# Patient Record
Sex: Male | Born: 1945
Health system: Southern US, Community
[De-identification: ages and names within clinical notes are randomized; demographics above are authoritative.]

## PROBLEM LIST (undated history)

## (undated) DIAGNOSIS — G709 Myoneural disorder, unspecified: Secondary | ICD-10-CM

## (undated) DIAGNOSIS — K5900 Constipation, unspecified: Secondary | ICD-10-CM

## (undated) DIAGNOSIS — R030 Elevated blood-pressure reading, without diagnosis of hypertension: Secondary | ICD-10-CM

## (undated) DIAGNOSIS — M1990 Unspecified osteoarthritis, unspecified site: Secondary | ICD-10-CM

## (undated) DIAGNOSIS — Z8679 Personal history of other diseases of the circulatory system: Secondary | ICD-10-CM

## (undated) DIAGNOSIS — Z972 Presence of dental prosthetic device (complete) (partial): Secondary | ICD-10-CM

## (undated) DIAGNOSIS — G4733 Obstructive sleep apnea (adult) (pediatric): Secondary | ICD-10-CM

## (undated) DIAGNOSIS — M19011 Primary osteoarthritis, right shoulder: Secondary | ICD-10-CM

## (undated) DIAGNOSIS — G4730 Sleep apnea, unspecified: Secondary | ICD-10-CM

## (undated) HISTORY — PX: LASIK: SHX215

## (undated) HISTORY — DX: Myoneural disorder, unspecified: G70.9

## (undated) HISTORY — PX: TONSILLECTOMY AND ADENOIDECTOMY: SUR1326

## (undated) HISTORY — PX: CARPAL TUNNEL RELEASE: SHX101

## (undated) HISTORY — DX: Constipation, unspecified: K59.00

## (undated) HISTORY — PX: COLONOSCOPY: SHX174

## (undated) HISTORY — PX: JOINT REPLACEMENT: SHX530

## (undated) HISTORY — PX: POLYPECTOMY: SHX149

## (undated) HISTORY — PX: TRIGGER FINGER RELEASE: SHX641

## (undated) HISTORY — PX: KNEE CARTILAGE SURGERY: SHX688

## (undated) HISTORY — DX: Elevated blood-pressure reading, without diagnosis of hypertension: R03.0

---

## 2000-05-13 ENCOUNTER — Ambulatory Visit (HOSPITAL_COMMUNITY): Admission: RE | Admit: 2000-05-13 | Discharge: 2000-05-13 | Payer: Self-pay | Admitting: Gastroenterology

## 2003-10-24 ENCOUNTER — Inpatient Hospital Stay (HOSPITAL_COMMUNITY): Admission: RE | Admit: 2003-10-24 | Discharge: 2003-10-26 | Payer: Self-pay | Admitting: Orthopedic Surgery

## 2003-11-15 ENCOUNTER — Ambulatory Visit (HOSPITAL_COMMUNITY): Admission: RE | Admit: 2003-11-15 | Discharge: 2003-11-15 | Payer: Self-pay | Admitting: Orthopedic Surgery

## 2005-09-04 ENCOUNTER — Ambulatory Visit: Payer: Self-pay | Admitting: Family Medicine

## 2005-12-10 ENCOUNTER — Ambulatory Visit: Payer: Self-pay | Admitting: Family Medicine

## 2007-12-10 ENCOUNTER — Ambulatory Visit: Payer: Self-pay | Admitting: Family Medicine

## 2007-12-10 ENCOUNTER — Telehealth: Payer: Self-pay | Admitting: Family Medicine

## 2007-12-10 DIAGNOSIS — G5600 Carpal tunnel syndrome, unspecified upper limb: Secondary | ICD-10-CM

## 2007-12-10 DIAGNOSIS — M6530 Trigger finger, unspecified finger: Secondary | ICD-10-CM | POA: Insufficient documentation

## 2007-12-10 DIAGNOSIS — M2550 Pain in unspecified joint: Secondary | ICD-10-CM | POA: Insufficient documentation

## 2007-12-10 DIAGNOSIS — G5620 Lesion of ulnar nerve, unspecified upper limb: Secondary | ICD-10-CM | POA: Insufficient documentation

## 2007-12-11 LAB — CONVERTED CEMR LAB: Uric Acid, Serum: 6.4 mg/dL (ref 4.0–7.8)

## 2007-12-30 ENCOUNTER — Ambulatory Visit (HOSPITAL_BASED_OUTPATIENT_CLINIC_OR_DEPARTMENT_OTHER): Admission: RE | Admit: 2007-12-30 | Discharge: 2007-12-30 | Payer: Self-pay | Admitting: Orthopedic Surgery

## 2007-12-30 ENCOUNTER — Encounter (INDEPENDENT_AMBULATORY_CARE_PROVIDER_SITE_OTHER): Payer: Self-pay | Admitting: Orthopedic Surgery

## 2008-02-03 ENCOUNTER — Ambulatory Visit (HOSPITAL_BASED_OUTPATIENT_CLINIC_OR_DEPARTMENT_OTHER): Admission: RE | Admit: 2008-02-03 | Discharge: 2008-02-03 | Payer: Self-pay | Admitting: Orthopedic Surgery

## 2010-01-25 ENCOUNTER — Encounter: Admission: RE | Admit: 2010-01-25 | Discharge: 2010-01-25 | Payer: Self-pay | Admitting: Orthopedic Surgery

## 2010-03-17 ENCOUNTER — Encounter: Payer: Self-pay | Admitting: Orthopedic Surgery

## 2010-07-10 NOTE — Op Note (Signed)
NAME:  Kyle [name], Kyle [name] NO.:  [id number]   MEDICAL RECORD NO.:  [id number]          PATIENT TYPE:  AMB   LOCATION:  DSC                          FACILITY:  [facility]   PHYSICIAN:  Artist Pais. Weingold, M.D.DATE OF BIRTH:  1945/07/03   DATE OF PROCEDURE:  12/30/2007  DATE OF DISCHARGE:                               OPERATIVE REPORT   PREOPERATIVE DIAGNOSES:  1. Left Dupuytren's contracture involving ring finger.  2. Left carpal tunnel syndrome.  3. Left index finger stenosing tenosynovitis.   POSTOPERATIVE DIAGNOSES:  1. Left Dupuytren's contracture involving ring finger.  2. Left carpal tunnel syndrome.  3. Left index finger stenosing tenosynovitis.   PROCEDURES:  1. Excision of Dupuytren's contracture, left hand, left ring finger.  2. Left carpal tunnel release.  3. Left index finger A1 pulley release.   SURGEON:  Artist Pais. Mina Marble, MD   ASSISTANT:  None.   ANESTHESIA:  General.   TOURNIQUET TIME:  45 minutes.   COMPLICATIONS:  None.   DRAINS:  None.   OPERATIVE REPORT:  The patient was taken to the operating suite.  After  induction of adequate general anesthesia, the left upper extremity was  prepped and draped in the usual sterile fashion.  Esmarch was  exsanguinated.  Limb tourniquet was inflated to 250 mmHg.  At this point  in time, a 2-cm incision was made in the palmar aspect of the left hand  in line along the metacarpals starting at the [facility] cardinal line.  Skin was incised.  Palmar fascia was identified and split.  The distal  edge of the transverse carpal ligament was identified and split with the  15 blade.  The median nerve was identified and protected with a Market researcher.  The remaining aspects of the transverse carpal ligament was  divided under direct vision using curved blunt scissors.  The canal was  inspected.  There were osseous lesions or ganglions present.  It was  irrigated and loosely closed with a 3-0 Prolene subcuticular  stitch.  Second incision was made over the ring finger in a Brunner fashion going  from the mid palmer to the level of the metacarpophalangeal joint.  Flaps were raised accordingly.  Dissection was carried down to the  flexor sheath where a large Dupuytren's cord was encountered.  It was  carefully dissected free off the flexor sheath from the level of the mid  palm to the metacarpophalangeal joint.  Neurovascular bundle was  identified and retracted.  The Dupuytren's contracture was sent for  pathologic confirmation.  Third incision was made transversely at the A1  pulley, index finger.  Skin was incised.  Neurovascular bundle was  identified and retracted.  The A1 pulley was split.  The FDS and FDP  tendons were lysed of all adhesions.  The wound was irrigated and these  too were closed with 3-0 Prolene subcuticular stitches.  Steri-Strips, 4  x 4s, fluffs, and a volar splint was applied.  The patient tolerated all  3 procedures well and went to the recovery room in stable fashion.      Molli Hazard  Corky Downs, M.D.  Electronically Signed     MAW/MEDQ  D:  12/30/2007  T:  12/30/2007  Job:  [id number]

## 2010-07-10 NOTE — Op Note (Signed)
NAME:  Kyle [name], Kyle [name] NO.:  [id number]   MEDICAL RECORD NO.:  [id number]          PATIENT TYPE:  AMB   LOCATION:  DSC                          FACILITY:  [facility]   PHYSICIAN:  Artist Pais. Weingold, M.D.DATE OF BIRTH:  Jul 11, 1945   DATE OF PROCEDURE:  02/03/2008  DATE OF DISCHARGE:                               OPERATIVE REPORT   PREOPERATIVE DIAGNOSES:  Right carpal tunnel syndrome, right index and  right long A1 pulley stenosing tenosynovitis.   POSTOPERATIVE DIAGNOSES:  Right carpal tunnel syndrome, right index and  right long A1 pulley stenosing tenosynovitis.   PROCEDURE:  Right carpal tunnel release and right index and right long  A1 pulley releases.   SURGEON:  Artist Pais. Mina Marble, MD   ASSISTANT:  None.   ANESTHESIA:  General.   TOURNIQUET TIME:  20 minutes.   COMPLICATIONS:  None.   DRAINS:  None.   OPERATIVE REPORT:  The patient was taken to the operating suite.  After  induction of adequate general anesthesia, right upper extremity was  prepped and draped in the usual sterile fashion.  An Esmarch was used to  exsanguinate the limb.  Tourniquet was then inflated to 250 mmHg.  At  this point in time, incision was made over the palmar aspect of the  right hand, in line with the carpometacarpal starting at [facility] cardinal  line.  Skin was incised.  Palmar fascia was identified and the distal  edge of the transverse carpal ligament was identified with a 15 blade.  The median nerve was identified and protected with Therapist, nutritional.  Remaining aspects of the transverse carpal ligament then divided under  direct vision using curved and blunt scissors.  Canal was inspected.  There were no osseous lesions or ganglions present.  It was irrigated  and nicely closed with a 3-0 Prolene subcuticular stitch.  Second  incision was made of the index finger A1 pulley and the third incision  of the long finger A1 pulley transversely in flexion creases.  Skin was  incised.  Neurovascular and bones identified and retracted, the A1  pulleys  were split.  The FDS and FDP tendons were lysed off all adhesions.  Wound was irrigated and these 2 were also closed with 3-0 Prolene  subcuticular stitches.  Steri-Strips, 4 x 4s, fluffs, and compressive  dressing was applied.  The patient tolerated all 3 procedures well and  went to recovery room in stable fashion.      Artist Pais Mina Marble, M.D.  Electronically Signed     MAW/MEDQ  D:  02/03/2008  T:  02/03/2008  Job:  [id number]

## 2010-07-13 NOTE — Procedures (Signed)
[facility]  Patient:    Kyle [name], Kyle [name]                       MRN: [id number] Proc. Date: 05/13/00 Adm. Date:  [id number] Attending:  Louie Bun                           Procedure Report  PROCEDURE:  Colonoscopy.  INDICATION FOR PROCEDURE:  History of adenomatous colon polyps on index colonoscopy three years ago.  DESCRIPTION OF PROCEDURE:  The patient was placed in the left lateral decubitus position then placed on the pulse monitor with continuous low flow oxygen delivered by nasal cannula. He was sedated with 90 mg IV Demerol and  9 mg IV Versed. The Olympus video colonoscope was inserted into the rectum and advanced to the cecum, confirmed by transillumination at McBurneys point and visualization of the ileocecal valve and appendiceal orifice. The prep was good. The cecum, ascending, transverse, and descending colon all appeared normal with no masses, polyps, diverticula or other mucosal abnormalities. Within the sigmoid colon were seen a few scattered diverticula, no other abnormalities. The rectum appeared normal and retroflexed view of the anus revealed no obvious internal hemorrhoids. The colonoscope was then withdrawn and the patient returned to the recovery room in stable condition. The patient tolerated the procedure well and there were no immediate complications.  IMPRESSION:  Left sided diverticulosis otherwise a normal study.  PLAN:  Repeat colonoscopy in 5 years. DD:  05/13/00 TD:  05/14/00 Job: [id number]

## 2010-07-13 NOTE — H&P (Signed)
NAME:  Kyle [name], CURLEY NO.:  [id number]   MEDICAL RECORD NO.:  [id number]                   PATIENT TYPE:  INP   LOCATION:  NA                                   FACILITY:  [facility]   PHYSICIAN:  Mila Homer. Sherlean Foot, M.D.              DATE OF BIRTH:  [birth year]   DATE OF ADMISSION:  10/24/2003  DATE OF DISCHARGE:                                HISTORY & PHYSICAL   CHIEF COMPLAINT:  Painful left knee.   HISTORY OF PRESENT ILLNESS:  The patient is a 65 year old white male with  history of cartilage injury with open surgery as a child, with repeat  arthroscopy in the 1990s with some chronic knee problems.  He has been  having increased achiness and sharp stabbing-type pains in his knee over the  last several years.  It is progressively worsening.  It bothers him with  range of motion and ambulation.  He is walking with a limp.  It is both  along the medial and lateral joint.  He does have occasional radiation of  the discomfort down the tibia.  He does have some stiffness with initiation  of ambulation.  X-rays revealed end-stage osteoarthritis, bone on bone,  medial compartment.   ALLERGIES:  CODEINE.   CURRENT MEDICATIONS:  1. Multivitamin one tablet daily.  2. Vitamin E one tablet p.o. daily.  3. Aspirin 81 mg p.o. daily.   PAST MEDICAL HISTORY:  The patient denies any significant medical issues.   PAST SURGICAL HISTORY:  1. Tonsillectomy and adenoidectomy in 1952.  2. Left knee surgery in 1966.  3. Left knee arthroscopy in 1992.  4. Right knee arthroscopy in 1990.  5. Laser surgery on his eyes in 2001.   The patient denies any complications to the above-mentioned surgical  procedures.   SOCIAL HISTORY:  The patient is a healthy-appearing, well-developed 58-year-  old white male.  No history of smoking or alcohol use.  He is currently  married.  He has several grown children.  He lives in a two-story house.  He  is currently working as a Estate agent.   Family physician is Marne A. Milinda Antis, M.D., at [phone].   FAMILY MEDICAL HISTORY:  Mother is deceased from an auto accident.  Father  is deceased from a stroke.  The patient has two brothers alive in good  health, one sister alive in good health.   REVIEW OF SYSTEMS:  Positive for reading glasses.  He does have occasional  problems with reflux.  Otherwise all other review of systems categories are  negative or noncontributory.   PHYSICAL EXAMINATION:  VITAL SIGNS:  Height is 5 feet 10 inches, weight is  215 pounds.  Blood pressure is 138/90, pulse of 88 and regular, respirations  12.  The patient is afebrile.  GENERAL:  This is a healthy-appearing, well-developed, physically fit-  appearing white male.  He walks  with a slight right-sided limp, but he is  able to get himself easily on and off the exam table without any difficulty  or obvious distress.  HEENT:  Head was normocephalic.  Pupils equal, round, and reactive,  accommodating to light.  Extraocular movements intact.  Sclerae are not  icteric.  External ears without deformity.  Gross hearing is intact.  Nasal  septum was midline.  Oral buccal mucosa was pink and moist.  NECK:  Supple, no palpable lymphadenopathy.  Thyroid region was nontender.  He had full range of motion without any difficulty or tenderness.  CHEST:  Lung sounds were clear and equal bilaterally.  No wheezes, rales,  rhonchi, or rubs noted.  CARDIAC:  Regular rate and rhythm, S1 and S2.  No murmurs, rubs, or gallops.  ABDOMEN:  Soft, nontender.  Bowel sounds were normoactive throughout.  No  hepatosplenomegaly palpable.  EXTREMITIES:  Upper extremities were symmetrically sized and shaped, and he  had fairly good range of motion of his shoulders, elbows, and wrists without  any difficulty.  Motor strength is 5/5.  Lower extremities:  Right and left  hip had full extension, flexion up to 130 with 20 degrees internal-external  rotation without  any difficulty.  The right knee had full extension, flexion  back to 130 degrees.  No medial to lateral instability.  No joint line  tenderness, no effusion.  The calf was nontender.  The right knee was round,  boggy appearing.  He had about 10 degrees short of full extension, flexion  back to 115 degrees.  He had about a 5 degree opening up with valgus  stressing, moderate amount of crepitus on the patella with range of motion.  No effusion, no signs of erythema.  Calf was nontender.  Ankles were  symmetrical with good dorsi-plantar flexion.  PERIPHERAL VASCULATURE:  Carotid pulses were 2+, no bruits.  Radial pulses  2+, posterior tibial pulses were 2+.  No lower extremity edema or venous  stasis changes.  NEUROLOGIC:  The patient was conscious, alert, and appropriate.  Easy  conversation with examiner.  The patient had no gross neurologic defects  noted.  BREASTS, RECTAL, GENITOURINARY:  Exams deferred at this time.   IMPRESSION:  End-stage osteoarthritis, left knee.   PLAN:  The patient will undergo all routine labs and tests prior to having a  left total knee arthroplasty by  Dr. Sherlean Foot at [facility] on October 23, 2003.      Jamelle Rushing, P.A.                      Mila Homer. Sherlean Foot, M.D.    RWK/MEDQ  D:  10/18/2003  T:  10/18/2003  Job:  [id number]

## 2010-07-13 NOTE — Op Note (Signed)
NAME:  KHYAN, OATS NO.:  [id number]   MEDICAL RECORD NO.:  [id number]                   PATIENT TYPE:  INP   LOCATION:  5010                                 FACILITY:  [facility]   PHYSICIAN:  Mila Homer. Sherlean Foot, M.D.              DATE OF BIRTH:  05-26-1945   DATE OF PROCEDURE:  10/24/2003  DATE OF DISCHARGE:                                 OPERATIVE REPORT   SURGEON:  Mila Homer. Sherlean Foot, M.D.   ASSISTANT:  Richardean Canal, P.A.   ANESTHESIA:  General.   PREOPERATIVE DIAGNOSIS:  Left knee osteoarthritis.   POSTOPERATIVE DIAGNOSIS:  Left knee osteoarthritis.   PROCEDURE:  Left total knee arthroplasty.   INDICATIONS FOR PROCEDURE:  The patient is a 65 year old white male with  failure of conservative measures for osteoarthritis of the knee.  Informed  consent was obtained.   DESCRIPTION OF PROCEDURE:  The patient was laid supine and administered  general anesthesia.  The left lower extremity was prepped and draped in the  usual sterile fashion and the extremity was exsanguinated with Esmarch.  A  midline incision was made with a #10 blade approximately 12 cm in length.  A  new 10 blade was used to make a median parapatellar arthrotomy and perform  synovectomy.  The deep MCL was peeled off the medial crest of the tibial  around to the semimembranous tendon.  The patella was everted and measured  26 mm.  A 35 mm reamer was reamed down to 26 mm.  Removed the excess bone  and soft tissue and drilled three lug holes with the template and with the  prosthetic trial in place, it also measured 26 mm.  I removed the prosthetic  trial and placed a Z-retractor deep to the MCL and went into flexion.  I cut  the ACL and PCL so I could deliver the tibia anteriorly.  I then used the  extramedullary alignment system to make a perpendicular cut to the anatomic  axis of the tibia removing 2 mm of bone off the medial tibial plateau.  I  removed the cut surface of the  bone.  I then removed the extramedullary  guide and turned my attention to the femur.  I made an intramedullary drill  hole and followed that with the intramedullary guide some 6 degree valgus  cut.  I pinned the distal femoral with the cutting block in place.  I  removed the intramedullary guide and made the distal femoral cut with the  sagittal saw.  I then drew out the epicondylar axis, measured the posterior  condylar angle to be 3 degrees.  I sized to a size G and pinned through the  3 degree external rotation holes.  I then pinned my 4-1 cone block in place  and made the anterior posterior and chamfer cuts while protecting the  ligaments with Z-retractors.  I then removed the excess  pieces of the bone  and the cutting block.  I then placed a laminar spreader in the knee,  removed the posterior condylar osteophytes, ACL, PCL, and medial and lateral  menisci.  I then put a 10 mm spacer block in place.  I had excellent  alignment.  Good flexion extension gap balancing.  I then finished the femur  with a size G finishing block.  I finished the tibia with a size 7 tibial  tray drilling keel.  I then trialed with a size 7 tibia and size G femur,  size 10 polyinsert, and size 35 patella.  I had excellent patellofemoral  tracking, good range of motion, dropping the angle back to 130 degrees.  I  then removed all of the trial prostheses and copiously irrigated.  I  cemented in the 7 tibia first, size G femur second, snapped in the  polyethylene, and located the knee extension.  I then cemented in the  patella.  All excess cement was removed.  After the cement was hardened, I  let the tourniquet down.  I left a Hemovac deep to the arthrotomy coming out  superolaterally.  I then closed the arthrotomy with interrupted #1 figure-of-  eight Vicryl sutures.  Deep  soft tissues with interrupted 0 Vicryl sutures, subcuticular 2-0 Vicryl, and  skin staples.  A dressing of Xeroform dressing, sponges,  sterile Webril, and  TED stockings.  Complications were none.  Drains was one Hemovac.  Estimated  blood loss was 300 mL.  Tourniquet time was 1 hour 8 minutes.                                               Mila Homer. Sherlean Foot, M.D.    SDL/MEDQ  D:  10/24/2003  T:  10/24/2003  Job:  [id number]

## 2010-09-14 ENCOUNTER — Telehealth: Payer: Self-pay | Admitting: *Deleted

## 2010-09-14 NOTE — Telephone Encounter (Signed)
Patient is asking if he can come in for a tdap. He says that he is about to have a new grand baby and wants to have this before it is born.

## 2010-09-14 NOTE — Telephone Encounter (Signed)
Yes, no TDAP on record... As long as last tetanus over 10 years ago he can come in for Tdap.

## 2010-09-17 NOTE — Telephone Encounter (Signed)
Appt made for patient

## 2010-09-19 ENCOUNTER — Ambulatory Visit (INDEPENDENT_AMBULATORY_CARE_PROVIDER_SITE_OTHER): Payer: Managed Care, Other (non HMO) | Admitting: Family Medicine

## 2010-09-19 DIAGNOSIS — Z23 Encounter for immunization: Secondary | ICD-10-CM | POA: Insufficient documentation

## 2010-09-19 NOTE — Progress Notes (Signed)
Tdap vaccine given today during nurse visit.  No charge.

## 2010-10-10 ENCOUNTER — Encounter: Payer: Self-pay | Admitting: Family Medicine

## 2010-10-11 ENCOUNTER — Ambulatory Visit (INDEPENDENT_AMBULATORY_CARE_PROVIDER_SITE_OTHER): Payer: Managed Care, Other (non HMO) | Admitting: Family Medicine

## 2010-10-11 ENCOUNTER — Encounter: Payer: Self-pay | Admitting: Family Medicine

## 2010-10-11 VITALS — BP 118/76 | HR 72 | Temp 98.4°F | Wt 206.2 lb

## 2010-10-11 DIAGNOSIS — K5792 Diverticulitis of intestine, part unspecified, without perforation or abscess without bleeding: Secondary | ICD-10-CM

## 2010-10-11 DIAGNOSIS — K5732 Diverticulitis of large intestine without perforation or abscess without bleeding: Secondary | ICD-10-CM

## 2010-10-11 HISTORY — DX: Diverticulitis of intestine, part unspecified, without perforation or abscess without bleeding: K57.92

## 2010-10-11 MED ORDER — CIPROFLOXACIN HCL 500 MG PO TABS: 500.0000 mg | ORAL_TABLET | Freq: Two times a day (BID) | ORAL | Status: AC

## 2010-10-11 NOTE — Patient Instructions (Signed)
Pls schedule Dr Patsy Lager with him for PE labs prior.

## 2010-10-11 NOTE — Assessment & Plan Note (Signed)
Has had colonoscopies by Dr  Bing regularly and been treated in the past for this. Will use Cipro 500 bid for ten days. HE has never needed Flagyl and appears not to need it today.  Knows to RTC if sxs worsen.

## 2010-10-11 NOTE — Progress Notes (Signed)
  Subjective:    Patient ID: Kyle [name], male    DOB: 10-20-1945, 65 y.o.   MRN: [id number]  HPI Pt seen only once here at [facility] 10/09 by his doc, Dr Patsy Lager, for arthritis. He has seen Dr Milinda Antis in the past, obviously before the computer was started here. He also sees Dr [place] Bing regularly for GI screening and is due a colonoscopy soon. He just got a letter to return.   He is here today for diverticulitis, having been treated for this in the past by Dr Viviann Spare and Dr [place] Bing. He has had pain in the LLQ for 5-6 days, better today since not eating since yesterday. He had some peanuts last week.  He has not had fever but has had chills, BMs have been nml, urination is ok. He has not taken any medications and otherwise feels well. He is generally very healthy.     Review of SystemsNoncontributory except as above.       Objective:   Physical Exam  Constitutional: He appears well-developed and well-nourished. No distress.  HENT:  Head: Normocephalic and atraumatic.  Right Ear: External ear normal.  Left Ear: External ear normal.  Nose: Nose normal.  Mouth/Throat: Oropharynx is clear and moist.  Eyes: Conjunctivae and EOM are normal. Pupils are equal, round, and reactive to light. Right eye exhibits no discharge. Left eye exhibits no discharge.  Neck: Normal range of motion. Neck supple.  Cardiovascular: Normal rate and regular rhythm.   Pulmonary/Chest: Effort normal and breath sounds normal. He has no wheezes.  Abdominal: Soft. Bowel sounds are normal. He exhibits no distension and no mass. There is tenderness (LLQ>RLQ, no referred.). There is no rebound and no guarding.  Lymphadenopathy:    He has no cervical adenopathy.  Skin: He is not diaphoretic.          Assessment & Plan:

## 2010-11-23 ENCOUNTER — Encounter: Payer: Self-pay | Admitting: Family Medicine

## 2010-11-27 LAB — POCT HEMOGLOBIN-HEMACUE: Hemoglobin: 14.1

## 2010-11-30 LAB — POCT HEMOGLOBIN-HEMACUE: Hemoglobin: 15 g/dL (ref 13.0–17.0)

## 2010-12-07 ENCOUNTER — Other Ambulatory Visit: Payer: Self-pay | Admitting: Family Medicine

## 2010-12-07 DIAGNOSIS — Z125 Encounter for screening for malignant neoplasm of prostate: Secondary | ICD-10-CM

## 2010-12-07 DIAGNOSIS — R5383 Other fatigue: Secondary | ICD-10-CM

## 2010-12-07 DIAGNOSIS — Z13220 Encounter for screening for lipoid disorders: Secondary | ICD-10-CM

## 2010-12-10 ENCOUNTER — Other Ambulatory Visit (INDEPENDENT_AMBULATORY_CARE_PROVIDER_SITE_OTHER): Payer: Managed Care, Other (non HMO)

## 2010-12-10 DIAGNOSIS — Z125 Encounter for screening for malignant neoplasm of prostate: Secondary | ICD-10-CM

## 2010-12-10 DIAGNOSIS — Z13220 Encounter for screening for lipoid disorders: Secondary | ICD-10-CM

## 2010-12-10 DIAGNOSIS — R5381 Other malaise: Secondary | ICD-10-CM

## 2010-12-10 LAB — CBC WITH DIFFERENTIAL/PLATELET
Basophils Absolute: 0 10*3/uL (ref 0.0–0.1)
HCT: 42.4 % (ref 39.0–52.0)
Lymphs Abs: 2.9 10*3/uL (ref 0.7–4.0)
MCV: 94.5 fl (ref 78.0–100.0)
Monocytes Absolute: 0.8 10*3/uL (ref 0.1–1.0)
Platelets: 276 10*3/uL (ref 150.0–400.0)
RDW: 12.8 % (ref 11.5–14.6)

## 2010-12-10 LAB — COMPREHENSIVE METABOLIC PANEL
ALT: 23 U/L (ref 0–53)
Alkaline Phosphatase: 70 U/L (ref 39–117)
CO2: 26 mEq/L (ref 19–32)
GFR: 106.47 mL/min (ref >60.00)
Potassium: 4.3 mEq/L (ref 3.5–5.1)
Sodium: 140 mEq/L (ref 135–145)
Total Bilirubin: 0.7 mg/dL (ref 0.3–1.2)
Total Protein: 7.7 g/dL (ref 6.0–8.3)

## 2010-12-10 LAB — LIPID PANEL
HDL: 63.7 mg/dL (ref >39.00)
Total CHOL/HDL Ratio: 3

## 2010-12-10 LAB — PSA: PSA: 0.64 ng/mL (ref 0.10–4.00)

## 2010-12-12 ENCOUNTER — Other Ambulatory Visit: Payer: Managed Care, Other (non HMO)

## 2010-12-17 ENCOUNTER — Ambulatory Visit (INDEPENDENT_AMBULATORY_CARE_PROVIDER_SITE_OTHER): Payer: Managed Care, Other (non HMO) | Admitting: Family Medicine

## 2010-12-17 ENCOUNTER — Encounter: Payer: Self-pay | Admitting: Family Medicine

## 2010-12-17 VITALS — BP 120/70 | HR 88 | Temp 98.2°F | Ht 69.5 in | Wt 209.8 lb

## 2010-12-17 DIAGNOSIS — Z23 Encounter for immunization: Secondary | ICD-10-CM

## 2010-12-17 DIAGNOSIS — Z0000 Encounter for general adult medical examination without abnormal findings: Secondary | ICD-10-CM

## 2010-12-17 NOTE — Patient Instructions (Signed)
zostavax (SHINGLES VACCINE) -- go to one of the pharmacies that has a sign that says "Shingles Vaccines" here. You can go there and they will check with your insurance and they can give it to you there.   If you live in [place], [facility] can do this.

## 2010-12-17 NOTE — Progress Notes (Signed)
Subjective:    Patient ID: Kyle [name], male    DOB: 28-Nov-1945, 65 y.o.   MRN: [id number]  HPI  Kyle [name], a 65 y.o. male presents today in the office for the following:    Still having some joint pains. Hands hurt, hands, hurt, back hurts.   Preventative Health Maintenance Visit:  Health Maintenance Summary Reviewed and updated, unless pt declines services.  Tobacco History Reviewed. Alcohol: No concerns, no excessive use Exercise Habits: Some activity, rec at least 30 mins 5 times a week STD concerns: no risk or activity to increase risk Drug Use: None Encouraged self-testicular check  Health Maintenance  Topic Date Due  . Zostavax  11/02/2006  . Influenza Vaccine  11/26/2010  . Colonoscopy  11/23/2015  . Tetanus/tdap  09/18/2020    Labs reviewed with the patient.   Lipids:    Component Value Date/Time   CHOL 170 12/10/2010 0817   TRIG 43.0 12/10/2010 0817   HDL 63.70 12/10/2010 0817   VLDL 8.6 12/10/2010 0817   CHOLHDL 3 12/10/2010 0817    CBC:    Component Value Date/Time   WBC 9.6 12/10/2010 0817   HGB 14.3 12/10/2010 0817   HCT 42.4 12/10/2010 0817   PLT 276.0 12/10/2010 0817   MCV 94.5 12/10/2010 0817   NEUTROABS 5.2 12/10/2010 0817   LYMPHSABS 2.9 12/10/2010 0817   MONOABS 0.8 12/10/2010 0817   EOSABS 0.6 12/10/2010 0817   BASOSABS 0.0 12/10/2010 0817    Basic Metabolic Panel:    Component Value Date/Time   NA 140 12/10/2010 0817   K 4.3 12/10/2010 0817   CL 106 12/10/2010 0817   CO2 26 12/10/2010 0817   BUN 16 12/10/2010 0817   CREATININE 0.8 12/10/2010 0817   GLUCOSE 106* 12/10/2010 0817   CALCIUM 9.1 12/10/2010 0817    Lab Results  Component Value Date   ALT 23 12/10/2010   AST 28 12/10/2010   ALKPHOS 70 12/10/2010   BILITOT 0.7 12/10/2010    Lab Results  Component Value Date   PSA 0.64 12/10/2010     Review of Systems General: Denies fever, chills, sweats. No significant weight loss. Eyes: Denies blurring,significant  itching ENT: Denies earache, sore throat, and hoarseness. Cardiovascular: Denies chest pains, palpitations, dyspnea on exertion Respiratory: Denies cough, dyspnea at rest,wheeezing Breast: no concerns about lumps GI: Denies nausea, vomiting, diarrhea, constipation, change in bowel habits, abdominal pain, melena, hematochezia GU: Denies penile discharge, ED, urinary flow / outflow problems. No STD concerns. Musculoskeletal: above Derm: Denies rash, itching Neuro: Denies  paresthesias, frequent falls, frequent headaches Psych: Denies depression, anxiety Endocrine: Denies cold intolerance, heat intolerance, polydipsia Heme: Denies enlarged lymph nodes Allergy: No hayfever     Objective:   Physical Exam   Physical Exam  Blood pressure 120/70, pulse 88, temperature 98.2 F (36.8 C), temperature source Oral, height 5' 9.5" (1.765 m), weight 209 lb 12.8 oz (95.165 kg), SpO2 99.00%.  PE: GEN: well developed, well nourished, no acute distress Eyes: conjunctiva and lids normal, PERRLA, EOMI ENT: TM clear, nares clear, oral exam WNL Neck: supple, no lymphadenopathy, no thyromegaly, no JVD Pulm: clear to auscultation and percussion, respiratory effort normal CV: regular rate and rhythm, S1-S2, no murmur, rub or gallop, no bruits, peripheral pulses normal and symmetric, no cyanosis, clubbing, edema or varicosities Chest: no scars, masses, no gynecomastia   GI: soft, non-tender; no hepatosplenomegaly, masses; active bowel sounds all quadrants GU: no hernia, testicular mass, penile discharge, priapism or prostate enlargement  Lymph: no cervical, axillary or inguinal adenopathy MSK: gait normal, muscle tone and strength WNL, no joint swelling, effusions, discoloration, crepitus  SKIN: clear, good turgor, color WNL, no rashes, lesions, or ulcerations Neuro: normal mental status, normal strength, sensation, and motion Psych: alert; oriented to person, place and time, normally interactive and not  anxious or depressed in appearance.       Assessment & Plan:   Health Maint: The patient's preventative maintenance and recommended screening tests for an annual wellness exam were reviewed in full today. Brought up to date unless services declined.  Counselled on the importance of diet, exercise, and its role in overall health and mortality. The patient's FH and SH was reviewed, including their home life, tobacco status, and drug and alcohol status.

## 2010-12-24 ENCOUNTER — Telehealth: Payer: Self-pay | Admitting: Family Medicine

## 2010-12-24 NOTE — Telephone Encounter (Signed)
Patient said you asked him to check and see if his insurance would pay for Zostavax.  Patient called his insurance and he said they'll pay @ 100%.  He would like to schedule an appointment to have the shot done after 11:30.  Please order the Zostavax.

## 2010-12-24 NOTE — Telephone Encounter (Signed)
Can you help him set up a zostavax nurse visit  Thanks!

## 2010-12-25 NOTE — Telephone Encounter (Signed)
Appt scheduled

## 2010-12-28 ENCOUNTER — Ambulatory Visit (INDEPENDENT_AMBULATORY_CARE_PROVIDER_SITE_OTHER): Payer: Managed Care, Other (non HMO) | Admitting: *Deleted

## 2010-12-28 DIAGNOSIS — Z23 Encounter for immunization: Secondary | ICD-10-CM

## 2011-04-01 ENCOUNTER — Ambulatory Visit (INDEPENDENT_AMBULATORY_CARE_PROVIDER_SITE_OTHER): Payer: Managed Care, Other (non HMO) | Admitting: Family Medicine

## 2011-04-01 ENCOUNTER — Telehealth: Payer: Self-pay | Admitting: Family Medicine

## 2011-04-01 ENCOUNTER — Encounter: Payer: Self-pay | Admitting: Family Medicine

## 2011-04-01 VITALS — BP 130/80 | HR 90 | Temp 99.2°F | Wt 209.5 lb

## 2011-04-01 DIAGNOSIS — J069 Acute upper respiratory infection, unspecified: Secondary | ICD-10-CM

## 2011-04-01 MED ORDER — AZITHROMYCIN 250 MG PO TABS: ORAL_TABLET | ORAL | Status: AC

## 2011-04-01 MED ORDER — DICLOFENAC SODIUM 1 % TD GEL: 1.0000 "application " | Freq: Four times a day (QID) | TRANSDERMAL | Status: DC

## 2011-04-01 NOTE — Telephone Encounter (Signed)
Yes.  If he needs a script: voltaren gel. Apply to affected joint 4 times daily. Dispense: 5 tubes. Refill prn.

## 2011-04-01 NOTE — Telephone Encounter (Signed)
Triage Record Num: [id number] Operator: Durward Mallard DiMatteis Patient Name: Kyle [name] Call Date & Time: 04/01/2011 10:28:43AM Patient Phone: [phone] PCP: Hannah Beat Patient Gender: Male PCP Fax : [phone] Patient DOB: Sep 23, 1945 Practice Name: [facility] Reason for Call: Caller: Rohnan/Patient; PCP: Juleen China.; CB#: [phone]; Call regarding Cough/Congestion; sx started 03/21/11; coughing up green phlem; having thick green nasal discharge with h/a; no fever; Triaged per URI Guideline; See in 24 hr d/t productive cough with colored sputum; pt would like to be seen today; appt made for today at 3:00pm Dr Dayton Martes Protocol(s) Used: Upper Respiratory Infection (URI) Recommended Outcome per Protocol: See Provider within 24 hours Reason for Outcome: Productive cough with colored sputum (other than clear or white sputum) Care Advice: ~ SYMPTOM / CONDITION MANAGEMENT 04/01/2011 10:40:46AM Page 1 of 1 [id number]

## 2011-04-01 NOTE — Telephone Encounter (Signed)
Patient seen dr Dayton Martes today for these symptoms but, wants to know if he can try voltaren gel for arthritis pain in his hands

## 2011-04-01 NOTE — Patient Instructions (Signed)
Take antibiotic as directed.  Drink lots of fluids.  Treat sympotmatically with Mucinex, nasal saline irrigation, and Tylenol/Ibuprofen. Also try claritin D or zyrtec D over the counter- two times a day as needed ( have to sign for them at pharmacy). You can use warm compresses.  Cough suppressant at night (like Delsym). Call if not improving as expected in 5-7 days.

## 2011-04-01 NOTE — Progress Notes (Signed)
SUBJECTIVE:  Kyle [name] is a 66 y.o. male who complains of coryza, congestion, productive cough and fever for 10 days. He denies a history of anorexia, chest pain and shortness of breath and denies a history of asthma. Patient denies smoke cigarettes.  Feels symptoms have worsened over past 2 days.   Patient Active Problem List  Diagnoses  . CARPAL TUNNEL SYNDROME  . ARTHRALGIA  . Diverticulitis   No past medical history on file. No past surgical history on file. History  Substance Use Topics  . Smoking status: Never Smoker   . Smokeless tobacco: Never Used  . Alcohol Use: No   No family history on file. Allergies  Allergen Reactions  . Codeine Nausea And Vomiting   Current Outpatient Prescriptions on File Prior to Visit  Medication Sig Dispense Refill  . aspirin 81 MG EC tablet Take 81 mg by mouth daily.        Marland Kitchen co-enzyme Q-10 30 MG capsule Take 30 mg by mouth daily.        . fish oil-omega-3 fatty acids 1000 MG capsule Take 2 g by mouth daily.        Marland Kitchen ibuprofen (ADVIL,MOTRIN) 200 MG tablet Take 200 mg by mouth. Take 4 by mouth in the am and pm       . Multiple Vitamin (MULTIVITAMIN) tablet Take 1 tablet by mouth daily.         The PMH, PSH, Social History, Family History, Medications, and allergies have been reviewed in [facility], and have been updated if relevant.  OBJECTIVE: BP 130/80  Pulse 90  Temp(Src) 99.2 F (37.3 C) (Oral)  Wt 209 lb 8 oz (95.029 kg)  He appears well, vital signs are as noted. Ears normal.  Throat and pharynx normal.  Neck supple. No adenopathy in the neck. Nose is congested. Sinuses non tender. The chest is clear, without wheezes or rales.  ASSESSMENT:  bronchitis  PLAN: Given duration and progression of symptoms, will treat for bacterial process. Symptomatic therapy suggested: push fluids, rest and return office visit prn if symptoms persist or worsen.  Call or return to clinic prn if these symptoms worsen or fail to improve as  anticipated.

## 2012-12-09 ENCOUNTER — Ambulatory Visit (INDEPENDENT_AMBULATORY_CARE_PROVIDER_SITE_OTHER): Payer: Medicare Other | Admitting: Family Medicine

## 2012-12-09 ENCOUNTER — Encounter: Payer: Self-pay | Admitting: Family Medicine

## 2012-12-09 VITALS — BP 112/70 | HR 87 | Temp 99.1°F | Ht 69.5 in | Wt 199.8 lb

## 2012-12-09 DIAGNOSIS — K5792 Diverticulitis of intestine, part unspecified, without perforation or abscess without bleeding: Secondary | ICD-10-CM

## 2012-12-09 DIAGNOSIS — K5732 Diverticulitis of large intestine without perforation or abscess without bleeding: Secondary | ICD-10-CM

## 2012-12-09 MED ORDER — METRONIDAZOLE 500 MG PO TABS: 500.0000 mg | ORAL_TABLET | Freq: Three times a day (TID) | ORAL | Status: DC

## 2012-12-09 MED ORDER — CIPROFLOXACIN HCL 750 MG PO TABS: 750.0000 mg | ORAL_TABLET | Freq: Two times a day (BID) | ORAL | Status: DC

## 2012-12-09 NOTE — Progress Notes (Signed)
Date:  12/09/2012   Name:  Kyle [name]   DOB:  22-Mar-1945   MRN:  [id number] Gender: male Age: 67 y.o.  Primary Physician:  Hannah Beat, MD   Chief Complaint: Abdominal Pain   History of Present Illness:  Kyle [name] is a 67 y.o. pleasant patient who presents with the following:  Had some diverticulitis a few times in the past. Flared up a week or ten days ago, and it is a little bit below. Has had some fever yesterday and this morning it was > 100.  2012 colonoscopy  Patient with a known history of diverticulosis confirmed on colonoscopy and a history of diverticulitis rarely over 20 years presents with left lower quadrant pain over the last 10 days. He has started to develop some fever over the last 2 days. He is relatively point tender over the left.  Patient Active Problem List   Diagnosis Date Noted  . Diverticulitis 10/11/2010  . CARPAL TUNNEL SYNDROME 12/10/2007  . ARTHRALGIA 12/10/2007    No past medical history on file.  No past surgical history on file.  History   Social History  . Marital Status: Married    Spouse Name: N/A    Number of Children: N/A  . Years of Education: N/A   Occupational History  . Not on file.   Social History Main Topics  . Smoking status: Never Smoker   . Smokeless tobacco: Never Used  . Alcohol Use: No  . Drug Use: No  . Sexual Activity: Not on file   Other Topics Concern  . Not on file   Social History Narrative   [place] Native   Avid golfer    No family history on file.  Allergies  Allergen Reactions  . Codeine Nausea And Vomiting    Medication list has been reviewed and updated.  Outpatient Prescriptions Prior to Visit  Medication Sig Dispense Refill  . aspirin 81 MG EC tablet Take 81 mg by mouth daily.        . diclofenac sodium (VOLTAREN) 1 % GEL Apply 1 application topically 4 (four) times daily.  100 g  5  . fish oil-omega-3 fatty acids 1000 MG capsule Take 2 g by mouth daily.        Marland Kitchen  ibuprofen (ADVIL,MOTRIN) 200 MG tablet Take 200 mg by mouth. Take 4 by mouth in the am and pm       . Multiple Vitamin (MULTIVITAMIN) tablet Take 1 tablet by mouth daily.        Marland Kitchen co-enzyme Q-10 30 MG capsule Take 30 mg by mouth daily.         No facility-administered medications prior to visit.    Review of Systems:  ROS: GEN: Acute illness details above GI: Tolerating PO intake GU: maintaining adequate hydration and urination Pulm: No SOB Interactive and getting along well at home.  Otherwise, ROS is as per the HPI.   Physical Examination: BP 112/70  Pulse 87  Temp(Src) 99.1 F (37.3 C) (Oral)  Ht 5' 9.5" (1.765 m)  Wt 199 lb 12 oz (90.606 kg)  BMI 29.08 kg/m2  Ideal Body Weight: Weight in (lb) to have BMI = 25: 171.4  GEN: WDWN, NAD, Non-toxic, A & O x 3 HEENT: Atraumatic, Normocephalic. Neck supple. No masses, No LAD. Ears and Nose: No external deformity. CV: RRR, No M/G/R. No JVD. No thrill. No extra heart sounds. PULM: CTA B, no wheezes, crackles, rhonchi. No retractions. No resp. distress. No  accessory muscle use. ABD: S, LLQ moderately tender to palpation, ND, +BS. No rebound. No HSM. EXTR: No c/c/e NEURO Normal gait.  PSYCH: Normally interactive. Conversant. Not depressed or anxious appearing.  Calm demeanor.    Assessment and Plan:  Diverticulitis  Presumptive treatment with cipro and flagyl  Orders Today:  No orders of the defined types were placed in this encounter.    Updated Medication List: (Includes new medications, updates to list, dose adjustments) Meds ordered this encounter  Medications  . glucosamine-chondroitin 500-400 MG tablet    Sig: Take 2 tablets by mouth daily.  . Magnesium 200 MG TABS    Sig: Take 1 tablet by mouth daily.  Marland Kitchen pyridoxine (B-6) 100 MG tablet    Sig: Take 100 mg by mouth daily.  . ciprofloxacin (CIPRO) 750 MG tablet    Sig: Take 1 tablet (750 mg total) by mouth 2 (two) times daily.    Dispense:  20 tablet     Refill:  0  . metroNIDAZOLE (FLAGYL) 500 MG tablet    Sig: Take 1 tablet (500 mg total) by mouth 3 (three) times daily.    Dispense:  30 tablet    Refill:  0    Medications Discontinued: There are no discontinued medications.    Signed,  Elpidio Galea. Skylinn Vialpando, MD, CAQ Sports Medicine  [facility] [address] [place] [address] Phone: [phone] Fax: [phone]

## 2013-01-25 ENCOUNTER — Other Ambulatory Visit: Payer: Self-pay | Admitting: Family Medicine

## 2013-01-25 DIAGNOSIS — Z13220 Encounter for screening for lipoid disorders: Secondary | ICD-10-CM

## 2013-01-25 DIAGNOSIS — Z79899 Other long term (current) drug therapy: Secondary | ICD-10-CM

## 2013-01-25 DIAGNOSIS — Z125 Encounter for screening for malignant neoplasm of prostate: Secondary | ICD-10-CM

## 2013-02-03 ENCOUNTER — Ambulatory Visit (INDEPENDENT_AMBULATORY_CARE_PROVIDER_SITE_OTHER): Payer: Medicare Other

## 2013-02-03 ENCOUNTER — Other Ambulatory Visit (INDEPENDENT_AMBULATORY_CARE_PROVIDER_SITE_OTHER): Payer: Medicare Other

## 2013-02-03 DIAGNOSIS — Z79899 Other long term (current) drug therapy: Secondary | ICD-10-CM

## 2013-02-03 DIAGNOSIS — Z13220 Encounter for screening for lipoid disorders: Secondary | ICD-10-CM

## 2013-02-03 DIAGNOSIS — Z125 Encounter for screening for malignant neoplasm of prostate: Secondary | ICD-10-CM

## 2013-02-03 DIAGNOSIS — Z23 Encounter for immunization: Secondary | ICD-10-CM

## 2013-02-03 LAB — CBC WITH DIFFERENTIAL/PLATELET
Basophils Relative: 0.8 % (ref 0.0–3.0)
Eosinophils Relative: 5.7 % — ABNORMAL HIGH (ref 0.0–5.0)
HCT: 43.2 % (ref 39.0–52.0)
Hemoglobin: 14.7 g/dL (ref 13.0–17.0)
Lymphocytes Relative: 35.6 % (ref 12.0–46.0)
Lymphs Abs: 3.8 10*3/uL (ref 0.7–4.0)
Monocytes Relative: 8.1 % (ref 3.0–12.0)
Neutro Abs: 5.3 10*3/uL (ref 1.4–7.7)
RBC: 4.65 Mil/uL (ref 4.22–5.81)
RDW: 12.9 % (ref 11.5–14.6)
WBC: 10.6 10*3/uL — ABNORMAL HIGH (ref 4.5–10.5)

## 2013-02-03 LAB — LIPID PANEL
HDL: 65.2 mg/dL (ref >39.00)
LDL Cholesterol: 98 mg/dL (ref 0–99)
Total CHOL/HDL Ratio: 3
Triglycerides: 100 mg/dL (ref 0.0–149.0)

## 2013-02-03 LAB — BASIC METABOLIC PANEL
BUN: 14 mg/dL (ref 6–23)
CO2: 29 mEq/L (ref 19–32)
GFR: 101.25 mL/min (ref >60.00)
Glucose, Bld: 97 mg/dL (ref 70–99)
Potassium: 4.2 mEq/L (ref 3.5–5.1)
Sodium: 138 mEq/L (ref 135–145)

## 2013-02-03 LAB — HEPATIC FUNCTION PANEL
Albumin: 4.2 g/dL (ref 3.5–5.2)
Alkaline Phosphatase: 67 U/L (ref 39–117)
Total Bilirubin: 0.8 mg/dL (ref 0.3–1.2)
Total Protein: 8.2 g/dL (ref 6.0–8.3)

## 2013-02-03 LAB — PSA: PSA: 0.59 ng/mL (ref 0.10–4.00)

## 2013-02-10 ENCOUNTER — Ambulatory Visit (INDEPENDENT_AMBULATORY_CARE_PROVIDER_SITE_OTHER): Payer: Medicare Other | Admitting: Family Medicine

## 2013-02-10 ENCOUNTER — Encounter: Payer: Self-pay | Admitting: Family Medicine

## 2013-02-10 VITALS — BP 128/80 | HR 69 | Temp 98.2°F | Ht 69.0 in | Wt 198.0 lb

## 2013-02-10 DIAGNOSIS — Z0000 Encounter for general adult medical examination without abnormal findings: Secondary | ICD-10-CM

## 2013-02-10 DIAGNOSIS — Z23 Encounter for immunization: Secondary | ICD-10-CM

## 2013-02-10 NOTE — Progress Notes (Signed)
Pre-visit discussion using our clinic review tool. No additional management support is needed unless otherwise documented below in the visit note.  

## 2013-02-11 NOTE — Progress Notes (Signed)
Date:  02/10/2013   Name:  Kyle [name]   DOB:  March 12, 1945   MRN:  [id number] Gender: male Age: 67 y.o.  Primary Physician:  Hannah Beat, MD   Chief Complaint: Medicare Wellness   Subjective:   History of Present Illness:  Kyle [name] is a 67 y.o. pleasant patient who presents with the following:  Preventative Health Maintenance Visit:  Overall, doing well, no c/o getting exercise.  Health Maintenance Summary Reviewed and updated, unless pt declines services.  Tobacco History Reviewed. Alcohol: No concerns, no excessive use Exercise Habits: Some activity, rec at least 30 mins 5 times a week STD concerns: no risk or activity to increase risk Drug Use: None Encouraged self-testicular check  Health Maintenance  Topic Date Due  . Influenza Vaccine  09/25/2013  . Colonoscopy  11/23/2015  . Tetanus/tdap  09/18/2020  . Pneumococcal Polysaccharide Vaccine Age 66 And Over  Completed  . Zostavax  Completed    Immunization History  Administered Date(s) Administered  . Influenza Split 12/17/2010  . Influenza Whole 12/10/2007  . Influenza,inj,Quad PF,36+ Mos 02/03/2013  . Pneumococcal Polysaccharide-23 02/10/2013  . Tdap 09/19/2010  . Zoster 12/28/2010    Patient Active Problem List   Diagnosis Date Noted  . Diverticulitis 10/11/2010  . CARPAL TUNNEL SYNDROME 12/10/2007  . ARTHRALGIA 12/10/2007    No past medical history on file.  No past surgical history on file.  History   Social History  . Marital Status: Married    Spouse Name: N/A    Number of Children: N/A  . Years of Education: N/A   Occupational History  . Not on file.   Social History Main Topics  . Smoking status: Never Smoker   . Smokeless tobacco: Never Used  . Alcohol Use: No  . Drug Use: No  . Sexual Activity: Not on file   Other Topics Concern  . Not on file   Social History Narrative   [place] Native   Avid golfer    No family history on file.  Allergies    Allergen Reactions  . Codeine Nausea And Vomiting    Medication list has been reviewed and updated.  Review of Systems:  General: Denies fever, chills, sweats. No significant weight loss. Eyes: Denies blurring,significant itching ENT: Denies earache, sore throat, and hoarseness. Cardiovascular: Denies chest pains, palpitations, dyspnea on exertion Respiratory: Denies cough, dyspnea at rest,wheeezing Breast: no concerns about lumps GI: Denies nausea, vomiting, diarrhea, constipation, change in bowel habits, abdominal pain, melena, hematochezia GU: Denies penile discharge, ED, urinary flow / outflow problems. No STD concerns. Musculoskeletal: Denies back pain, joint pain Derm: Denies rash, itching Neuro: Denies  paresthesias, frequent falls, frequent headaches Psych: Denies depression, anxiety Endocrine: Denies cold intolerance, heat intolerance, polydipsia Heme: Denies enlarged lymph nodes Allergy: No hayfever  Objective:   Physical Examination: BP 128/80  Pulse 69  Temp(Src) 98.2 F (36.8 C) (Oral)  Ht 5\' 9"  (1.753 m)  Wt 198 lb (89.812 kg)  BMI 29.23 kg/m2 Ideal Body Weight: Weight in (lb) to have BMI = 25: 168.9  GEN: well developed, well nourished, no acute distress Eyes: conjunctiva and lids normal, PERRLA, EOMI ENT: TM clear, nares clear, oral exam WNL Neck: supple, no lymphadenopathy, no thyromegaly, no JVD Pulm: clear to auscultation and percussion, respiratory effort normal CV: regular rate and rhythm, S1-S2, no murmur, rub or gallop, no bruits, peripheral pulses normal and symmetric, no cyanosis, clubbing, edema or varicosities GI: soft, non-tender; no hepatosplenomegaly, masses; active bowel  sounds all quadrants GU: no hernia, testicular mass, penile discharge Lymph: no cervical, axillary or inguinal adenopathy MSK: gait normal, muscle tone and strength WNL, no joint swelling, effusions, discoloration, crepitus  SKIN: clear, good turgor, color WNL, no rashes,  lesions, or ulcerations Neuro: normal mental status, normal strength, sensation, and motion Psych: alert; oriented to person, place and time, normally interactive and not anxious or depressed in appearance.  All labs reviewed with patient.  Lipids:    Component Value Date/Time   CHOL 183 02/03/2013 0758   TRIG 100.0 02/03/2013 0758   HDL 65.20 02/03/2013 0758   VLDL 20.0 02/03/2013 0758   CHOLHDL 3 02/03/2013 0758    CBC:    Component Value Date/Time   WBC 10.6* 02/03/2013 0758   HGB 14.7 02/03/2013 0758   HCT 43.2 02/03/2013 0758   PLT 296.0 02/03/2013 0758   MCV 93.1 02/03/2013 0758   NEUTROABS 5.3 02/03/2013 0758   LYMPHSABS 3.8 02/03/2013 0758   MONOABS 0.9 02/03/2013 0758   EOSABS 0.6 02/03/2013 0758   BASOSABS 0.1 02/03/2013 0758    Basic Metabolic Panel:    Component Value Date/Time   NA 138 02/03/2013 0758   K 4.2 02/03/2013 0758   CL 100 02/03/2013 0758   CO2 29 02/03/2013 0758   BUN 14 02/03/2013 0758   CREATININE 0.8 02/03/2013 0758   GLUCOSE 97 02/03/2013 0758   CALCIUM 9.2 02/03/2013 0758    Lab Results  Component Value Date   ALT 25 02/03/2013   AST 26 02/03/2013   ALKPHOS 67 02/03/2013   BILITOT 0.8 02/03/2013    No results found for this basename: TSH    Lab Results  Component Value Date   PSA 0.59 02/03/2013   PSA 0.64 12/10/2010    Assessment & Plan:   Health Maintenance Exam: The patient's preventative maintenance and recommended screening tests for an annual wellness exam were reviewed in full today. Brought up to date unless services declined.  Counselled on the importance of diet, exercise, and its role in overall health and mortality. The patient's FH and SH was reviewed, including their home life, tobacco status, and drug and alcohol status.   Routine general medical examination at a health care facility  Need for prophylactic vaccination against Streptococcus pneumoniae (pneumococcus) - Plan: Pneumococcal polysaccharide  vaccine 23-valent greater than or equal to 2yo subcutaneous/IM  I have personally reviewed the Medicare Annual Wellness questionnaire and have noted 1. The patient's medical and social history 2. Their use of alcohol, tobacco or illicit drugs 3. Their current medications and supplements 4. The patient's functional ability including ADL's, fall risks, home safety risks and hearing or visual             impairment. 5. Diet and physical activities 6. Evidence for depression or mood disorders  The patients weight, height, BMI and visual acuity have been recorded in the chart I have made referrals, counseling and provided education to the patient based review of the above and I have provided the pt with a written personalized care plan for preventive services.  I have provided the patient with a copy of your personalized plan for preventive services. Instructed to take the time to review along with their updated medication list.   There are no Patient Instructions on file for this visit.  Orders Today:  Orders Placed This Encounter  Procedures  . Pneumococcal polysaccharide vaccine 23-valent greater than or equal to 2yo subcutaneous/IM    New medications, updates to list,  dose adjustments: Meds ordered this encounter  Medications  . Omega-3 Fatty Acids (FISH OIL) 1200 MG CAPS    Sig: Take 1 capsule by mouth 2 (two) times daily.  . Magnesium 250 MG TABS    Sig: Take 1 tablet by mouth daily.    Signed,  Elpidio Galea. Clide Remmers, MD, CAQ Sports Medicine  [facility] [address] [place] [address] Phone: [phone] Fax: [phone]  Updated Complete Medication List:   Medication List       This list is accurate as of: 02/10/13 11:59 PM.  Always use your most recent med list.               aspirin 81 MG EC tablet  Take 81 mg by mouth daily.     diclofenac sodium 1 % Gel  Commonly known as:  VOLTAREN  Apply 1 application topically 4 (four)  times daily.     Fish Oil 1200 MG Caps  Take 1 capsule by mouth 2 (two) times daily.     glucosamine-chondroitin 500-400 MG tablet  Take 2 tablets by mouth daily.     ibuprofen 200 MG tablet  Commonly known as:  ADVIL,MOTRIN  Take 200 mg by mouth. Take 4 by mouth in the am and pm     Magnesium 250 MG Tabs  Take 1 tablet by mouth daily.     multivitamin tablet  Take 1 tablet by mouth daily.     pyridoxine 100 MG tablet  Commonly known as:  B-6  Take 100 mg by mouth daily.

## 2013-12-09 ENCOUNTER — Encounter: Payer: Self-pay | Admitting: Family Medicine

## 2013-12-09 ENCOUNTER — Ambulatory Visit (INDEPENDENT_AMBULATORY_CARE_PROVIDER_SITE_OTHER): Payer: 59 | Admitting: Family Medicine

## 2013-12-09 VITALS — BP 130/70 | HR 78 | Temp 97.9°F | Ht 69.0 in | Wt 204.0 lb

## 2013-12-09 DIAGNOSIS — J189 Pneumonia, unspecified organism: Secondary | ICD-10-CM

## 2013-12-09 MED ORDER — HYDROCODONE-HOMATROPINE 5-1.5 MG/5ML PO SYRP: ORAL_SOLUTION | ORAL | Status: DC

## 2013-12-09 MED ORDER — AZITHROMYCIN 250 MG PO TABS: ORAL_TABLET | ORAL | Status: DC

## 2013-12-09 NOTE — Progress Notes (Signed)
Dr. Frederico Hamman T. Cloris Flippo, MD, [facility] [place], [address] Phone: [phone] Fax: [phone]  12/09/2013  Patient: Kyle [name], MRN: [id number], DOB: [birth year], 68 y.o.  Primary Physician:  Owens Loffler, MD  Chief Complaint: Cough and Chest Congestion  Subjective:   SHAWNDALE [name] is a 68 y.o. very pleasant male patient who presents with the following:  Patient presents for presents evaluation of myalgias, productive cough. Symptoms began > 1 week ago and are gradually worsening since that time.  Cough that cannot get rid of it. When lying on her side, and can here some gurgling - in the last 24 hours. Taking some robitussin. Deep gurgling in chest.  Risk Factors:  The patient denies significant nausea, vomitting, diarrhea, rash, diffuse arthralgia or myalgia. They also deny high fever.   Past Medical History, Surgical History, Social History, Family History, Problem List, Medications, and Allergies have been reviewed and updated if relevant.  ROS: GEN: Acute illness details above GI: Tolerating PO intake GU: maintaining adequate hydration and urination Pulm: No SOB Interactive and getting along well at home.  Otherwise, ROS is as per the HPI.   Objective:   BP 130/70  Pulse 78  Temp(Src) 97.9 F (36.6 C) (Oral)  Ht 5\' 9"  (1.753 m)  Wt 204 lb (92.534 kg)  BMI 30.11 kg/m2  SpO2 95%   GEN: A and O x 3. WDWN. NAD.    ENT: Nose clear, ext NML.  No LAD.  No JVD.  TM's clear. Oropharynx clear.  PULM: Normal WOB, no distress. No crackles, wheezes. + scattered rhonchi. CV: RRR, no M/G/R, No rubs, No JVD.   EXT: warm and well-perfused, No c/c/e. PSYCH: Pleasant and conversant.   Laboratory and Imaging Data: No results found.  Assessment and Plan:   Walking pneumonia  At this point, we reviewed supportive care. Given the length of time and risk factors, treat with medications below.  Medical  decision making includes all plans, orders, medications, and patient instructions reviewed face to face.   Follow-up: No Follow-up on file.  New Prescriptions   AZITHROMYCIN (ZITHROMAX) 250 MG TABLET    2 tabs po on day 1, then 1 tab po for 4 days   HYDROCODONE-HOMATROPINE (HYCODAN) 5-1.5 MG/5ML SYRUP    1 tsp po at night before bed prn cough   No orders of the defined types were placed in this encounter.    Signed,  Maud Deed. Katie Moch, MD   Patient's Medications  New Prescriptions   AZITHROMYCIN (ZITHROMAX) 250 MG TABLET    2 tabs po on day 1, then 1 tab po for 4 days   HYDROCODONE-HOMATROPINE (HYCODAN) 5-1.5 MG/5ML SYRUP    1 tsp po at night before bed prn cough  Previous Medications   ASPIRIN 81 MG EC TABLET    Take 81 mg by mouth daily.     GLUCOSAMINE-CHONDROITIN 500-400 MG TABLET    Take 2 tablets by mouth daily.   IBUPROFEN (ADVIL,MOTRIN) 200 MG TABLET    Take 200 mg by mouth. Take 4 by mouth in the am and pm    MAGNESIUM 250 MG TABS    Take 1 tablet by mouth daily.   MULTIPLE VITAMIN (MULTIVITAMIN) TABLET    Take 1 tablet by mouth daily.     OMEGA-3 FATTY ACIDS (FISH OIL) 1200 MG CAPS    Take 1 capsule by mouth 2 (two) times daily.   PYRIDOXINE (B-6) 100  MG TABLET    Take 100 mg by mouth daily.  Modified Medications   No medications on file  Discontinued Medications   DICLOFENAC SODIUM (VOLTAREN) 1 % GEL    Apply 1 application topically 4 (four) times daily.

## 2013-12-09 NOTE — Progress Notes (Signed)
Pre visit review using our clinic review tool, if applicable. No additional management support is needed unless otherwise documented below in the visit note. 

## 2014-02-07 ENCOUNTER — Other Ambulatory Visit (INDEPENDENT_AMBULATORY_CARE_PROVIDER_SITE_OTHER): Payer: Commercial Managed Care - HMO

## 2014-02-07 DIAGNOSIS — Z79899 Other long term (current) drug therapy: Secondary | ICD-10-CM

## 2014-02-07 DIAGNOSIS — Z13220 Encounter for screening for lipoid disorders: Secondary | ICD-10-CM

## 2014-02-07 DIAGNOSIS — Z125 Encounter for screening for malignant neoplasm of prostate: Secondary | ICD-10-CM

## 2014-02-07 LAB — CBC WITH DIFFERENTIAL/PLATELET
Basophils Absolute: 0.1 10*3/uL (ref 0.0–0.1)
Basophils Relative: 0.8 % (ref 0.0–3.0)
Eosinophils Absolute: 0.5 10*3/uL (ref 0.0–0.7)
Eosinophils Relative: 5.1 % — ABNORMAL HIGH (ref 0.0–5.0)
HCT: 45 % (ref 39.0–52.0)
Hemoglobin: 14.8 g/dL (ref 13.0–17.0)
LYMPHS ABS: 3 10*3/uL (ref 0.7–4.0)
Lymphocytes Relative: 28.7 % (ref 12.0–46.0)
MCHC: 33 g/dL (ref 30.0–36.0)
MCV: 95.1 fl (ref 78.0–100.0)
MONO ABS: 0.8 10*3/uL (ref 0.1–1.0)
Monocytes Relative: 7.4 % (ref 3.0–12.0)
NEUTROS PCT: 58 % (ref 43.0–77.0)
Neutro Abs: 6.1 10*3/uL (ref 1.4–7.7)
PLATELETS: 308 10*3/uL (ref 150.0–400.0)
RBC: 4.73 Mil/uL (ref 4.22–5.81)
RDW: 12.8 % (ref 11.5–15.5)
WBC: 10.4 10*3/uL (ref 4.0–10.5)

## 2014-02-07 LAB — BASIC METABOLIC PANEL
BUN: 15 mg/dL (ref 6–23)
CALCIUM: 9.3 mg/dL (ref 8.4–10.5)
CO2: 28 mEq/L (ref 19–32)
Chloride: 104 mEq/L (ref 96–112)
Creatinine, Ser: 0.8 mg/dL (ref 0.4–1.5)
GFR: 105.44 mL/min (ref >60.00)
Glucose, Bld: 98 mg/dL (ref 70–99)
POTASSIUM: 4.3 meq/L (ref 3.5–5.1)
SODIUM: 136 meq/L (ref 135–145)

## 2014-02-07 LAB — HEPATIC FUNCTION PANEL
ALT: 23 U/L (ref 0–53)
AST: 30 U/L (ref 0–37)
Albumin: 3.9 g/dL (ref 3.5–5.2)
Alkaline Phosphatase: 67 U/L (ref 39–117)
BILIRUBIN DIRECT: 0.1 mg/dL (ref 0.0–0.3)
BILIRUBIN TOTAL: 0.8 mg/dL (ref 0.2–1.2)
Total Protein: 7.5 g/dL (ref 6.0–8.3)

## 2014-02-07 LAB — LIPID PANEL
CHOLESTEROL: 177 mg/dL (ref 0–200)
HDL: 66.3 mg/dL (ref >39.00)
LDL Cholesterol: 99 mg/dL (ref 0–99)
NonHDL: 110.7
Total CHOL/HDL Ratio: 3
Triglycerides: 58 mg/dL (ref 0.0–149.0)
VLDL: 11.6 mg/dL (ref 0.0–40.0)

## 2014-02-07 LAB — PSA: PSA: 0.88 ng/mL (ref 0.10–4.00)

## 2014-02-09 ENCOUNTER — Emergency Department (HOSPITAL_COMMUNITY): Payer: No Typology Code available for payment source

## 2014-02-09 ENCOUNTER — Encounter (HOSPITAL_COMMUNITY): Payer: Self-pay | Admitting: Physical Medicine and Rehabilitation

## 2014-02-09 ENCOUNTER — Emergency Department (HOSPITAL_COMMUNITY)
Admission: EM | Admit: 2014-02-09 | Discharge: 2014-02-09 | Disposition: A | Discharge status: Home / Self Care | Payer: No Typology Code available for payment source | Attending: Emergency Medicine | Admitting: Emergency Medicine

## 2014-02-09 DIAGNOSIS — Y998 Other external cause status: Secondary | ICD-10-CM | POA: Insufficient documentation

## 2014-02-09 DIAGNOSIS — Y9355 Activity, bike riding: Secondary | ICD-10-CM | POA: Insufficient documentation

## 2014-02-09 DIAGNOSIS — T1490XA Injury, unspecified, initial encounter: Secondary | ICD-10-CM

## 2014-02-09 DIAGNOSIS — S6991XA Unspecified injury of right wrist, hand and finger(s), initial encounter: Secondary | ICD-10-CM | POA: Diagnosis present

## 2014-02-09 DIAGNOSIS — S60511A Abrasion of right hand, initial encounter: Secondary | ICD-10-CM | POA: Diagnosis not present

## 2014-02-09 DIAGNOSIS — Z7982 Long term (current) use of aspirin: Secondary | ICD-10-CM | POA: Diagnosis not present

## 2014-02-09 DIAGNOSIS — S62611A Displaced fracture of proximal phalanx of left index finger, initial encounter for closed fracture: Secondary | ICD-10-CM | POA: Diagnosis not present

## 2014-02-09 DIAGNOSIS — Z79899 Other long term (current) drug therapy: Secondary | ICD-10-CM | POA: Diagnosis not present

## 2014-02-09 DIAGNOSIS — Y92410 Unspecified street and highway as the place of occurrence of the external cause: Secondary | ICD-10-CM | POA: Insufficient documentation

## 2014-02-09 DIAGNOSIS — S61411A Laceration without foreign body of right hand, initial encounter: Secondary | ICD-10-CM

## 2014-02-09 DIAGNOSIS — S60512A Abrasion of left hand, initial encounter: Secondary | ICD-10-CM | POA: Diagnosis not present

## 2014-02-09 DIAGNOSIS — S62619A Displaced fracture of proximal phalanx of unspecified finger, initial encounter for closed fracture: Secondary | ICD-10-CM

## 2014-02-09 DIAGNOSIS — S0081XA Abrasion of other part of head, initial encounter: Secondary | ICD-10-CM | POA: Insufficient documentation

## 2014-02-09 DIAGNOSIS — S0031XA Abrasion of nose, initial encounter: Secondary | ICD-10-CM | POA: Diagnosis not present

## 2014-02-09 DIAGNOSIS — Z23 Encounter for immunization: Secondary | ICD-10-CM | POA: Insufficient documentation

## 2014-02-09 DIAGNOSIS — T07XXXA Unspecified multiple injuries, initial encounter: Secondary | ICD-10-CM

## 2014-02-09 DIAGNOSIS — S060X9A Concussion with loss of consciousness of unspecified duration, initial encounter: Secondary | ICD-10-CM | POA: Diagnosis not present

## 2014-02-09 LAB — CBC WITH DIFFERENTIAL/PLATELET
BASOS PCT: 1 % (ref 0–1)
Basophils Absolute: 0.1 10*3/uL (ref 0.0–0.1)
EOS ABS: 0.5 10*3/uL (ref 0.0–0.7)
EOS PCT: 4 % (ref 0–5)
HEMATOCRIT: 41.2 % (ref 39.0–52.0)
HEMOGLOBIN: 14.1 g/dL (ref 13.0–17.0)
LYMPHS ABS: 3.9 10*3/uL (ref 0.7–4.0)
Lymphocytes Relative: 31 % (ref 12–46)
MCH: 32.3 pg (ref 26.0–34.0)
MCHC: 34.2 g/dL (ref 30.0–36.0)
MCV: 94.5 fL (ref 78.0–100.0)
MONO ABS: 1 10*3/uL (ref 0.1–1.0)
MONOS PCT: 8 % (ref 3–12)
NEUTROS PCT: 56 % (ref 43–77)
Neutro Abs: 7.3 10*3/uL (ref 1.7–7.7)
Platelets: 283 10*3/uL (ref 150–400)
RBC: 4.36 MIL/uL (ref 4.22–5.81)
RDW: 12.3 % (ref 11.5–15.5)
WBC: 12.8 10*3/uL — ABNORMAL HIGH (ref 4.0–10.5)

## 2014-02-09 LAB — I-STAT CG4 LACTIC ACID, ED: Lactic Acid, Venous: 1.64 mmol/L (ref 0.5–2.2)

## 2014-02-09 LAB — I-STAT CHEM 8, ED
BUN: 18 mg/dL (ref 6–23)
Calcium, Ion: 1.14 mmol/L (ref 1.13–1.30)
Chloride: 102 mEq/L (ref 96–112)
Creatinine, Ser: 1 mg/dL (ref 0.50–1.35)
Glucose, Bld: 111 mg/dL — ABNORMAL HIGH (ref 70–99)
HEMATOCRIT: 43 % (ref 39.0–52.0)
Hemoglobin: 14.6 g/dL (ref 13.0–17.0)
POTASSIUM: 4 meq/L (ref 3.7–5.3)
Sodium: 137 mEq/L (ref 137–147)
TCO2: 23 mmol/L (ref 0–100)

## 2014-02-09 LAB — HEPATIC FUNCTION PANEL
ALK PHOS: 73 U/L (ref 39–117)
ALT: 24 U/L (ref 0–53)
AST: 32 U/L (ref 0–37)
Albumin: 3.7 g/dL (ref 3.5–5.2)
TOTAL PROTEIN: 7.9 g/dL (ref 6.0–8.3)
Total Bilirubin: 0.3 mg/dL (ref 0.3–1.2)

## 2014-02-09 LAB — I-STAT TROPONIN, ED: TROPONIN I, POC: 0.01 ng/mL (ref 0.00–0.08)

## 2014-02-09 LAB — ETHANOL

## 2014-02-09 MED ORDER — TETANUS-DIPHTH-ACELL PERTUSSIS 5-2.5-18.5 LF-MCG/0.5 IM SUSP
0.5000 mL | Freq: Once | INTRAMUSCULAR | Status: AC
  Administered 2014-02-09: 0.5 mL via INTRAMUSCULAR
  Filled 2014-02-09: qty 0.5

## 2014-02-09 MED ORDER — ONDANSETRON 4 MG PO TBDP: 4.0000 mg | ORAL_TABLET | Freq: Three times a day (TID) | ORAL | Status: DC | PRN

## 2014-02-09 MED ORDER — FENTANYL CITRATE 0.05 MG/ML IJ SOLN
50.0000 ug | Freq: Once | INTRAMUSCULAR | Status: AC
  Administered 2014-02-09: 50 ug via INTRAVENOUS
  Filled 2014-02-09: qty 2

## 2014-02-09 MED ORDER — BACITRACIN 500 UNIT/GM EX OINT
1.0000 "application " | TOPICAL_OINTMENT | Freq: Two times a day (BID) | CUTANEOUS | Status: DC
  Administered 2014-02-09: 1 via TOPICAL
  Filled 2014-02-09 (×2): qty 0.9

## 2014-02-09 MED ORDER — CEPHALEXIN 500 MG PO CAPS: 500.0000 mg | ORAL_CAPSULE | Freq: Four times a day (QID) | ORAL | Status: DC

## 2014-02-09 MED ORDER — NAPROXEN 500 MG PO TABS: 500.0000 mg | ORAL_TABLET | Freq: Two times a day (BID) | ORAL | Status: DC

## 2014-02-09 MED ORDER — ACETAMINOPHEN 500 MG PO TABS
500.0000 mg | ORAL_TABLET | Freq: Once | ORAL | Status: DC
  Filled 2014-02-09: qty 1

## 2014-02-09 MED ORDER — BACITRACIN ZINC 500 UNIT/GM EX OINT: 1.0000 "application " | TOPICAL_OINTMENT | Freq: Two times a day (BID) | CUTANEOUS | Status: DC

## 2014-02-09 MED ORDER — LIDOCAINE-EPINEPHRINE 1 %-1:100000 IJ SOLN
10.0000 mL | Freq: Once | INTRAMUSCULAR | Status: AC
  Administered 2014-02-09: 10 mL
  Filled 2014-02-09: qty 1

## 2014-02-09 MED ORDER — HYDROCODONE-ACETAMINOPHEN 5-325 MG PO TABS: 2.0000 | ORAL_TABLET | ORAL | Status: DC | PRN
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
## 2014-02-09 NOTE — ED Notes (Signed)
Pt placed on monitor, continuous pulse oximetry and blood pressure cuff; warm blankets given

## 2014-02-09 NOTE — ED Notes (Signed)
Dr. Sabra Heck at bedside performing suture care. Pt tolerating without difficulty at the time. Family at bedside.

## 2014-02-09 NOTE — ED Notes (Signed)
Spoke with wife on phone, she is on the way to hospital.

## 2014-02-09 NOTE — ED Notes (Signed)
Pt returned to exam room. Wife at bedside.

## 2014-02-09 NOTE — ED Notes (Signed)
Gave patient ice bags for is wounds

## 2014-02-09 NOTE — ED Notes (Signed)
Family at bedside. 

## 2014-02-09 NOTE — Discharge Instructions (Signed)
Antibiotics to prevent hand infection Keep wound clean and dry See your doctor for wound check in 2 days You have a total of 2 stitches in your R hand Return for seizures, persistent vomiting, redness or swelling of hands, fevers. Change dressings every 12 hours  Please call your doctor for a followup appointment within 24-48 hours. When you talk to your doctor please let them know that you were seen in the emergency department and have them acquire all of your records so that they can discuss the findings with you and formulate a treatment plan to fully care for your new and ongoing problems.
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
## 2014-02-09 NOTE — ED Notes (Signed)
All wounds covered with Bacitracin and non-adherent dressing. Pt tolerated without difficulty.
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
## 2014-02-09 NOTE — ED Notes (Signed)
All belongings including clothes, cell phone, helmet, and wallet given to wife at bedside.

## 2014-02-09 NOTE — ED Notes (Signed)
Pt transported to CT scan.

## 2014-02-09 NOTE — ED Notes (Signed)
Suture cart at bedside 

## 2014-02-09 NOTE — ED Notes (Signed)
Pt.unable to walk the patient.gotten light headed.assit to wheelchair
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
## 2014-02-11 ENCOUNTER — Ambulatory Visit (INDEPENDENT_AMBULATORY_CARE_PROVIDER_SITE_OTHER): Payer: Commercial Managed Care - HMO | Admitting: Family Medicine

## 2014-02-11 ENCOUNTER — Encounter: Payer: Self-pay | Admitting: Family Medicine

## 2014-02-11 DIAGNOSIS — T07XXXA Unspecified multiple injuries, initial encounter: Secondary | ICD-10-CM

## 2014-02-11 DIAGNOSIS — K5909 Other constipation: Secondary | ICD-10-CM

## 2014-02-11 DIAGNOSIS — F0781 Postconcussional syndrome: Secondary | ICD-10-CM

## 2014-02-11 DIAGNOSIS — K5903 Drug induced constipation: Secondary | ICD-10-CM | POA: Insufficient documentation

## 2014-02-11 DIAGNOSIS — T50905A Adverse effect of unspecified drugs, medicaments and biological substances, initial encounter: Secondary | ICD-10-CM

## 2014-02-11 DIAGNOSIS — T148 Other injury of unspecified body region: Secondary | ICD-10-CM

## 2014-02-11 MED ORDER — HYDROCODONE-ACETAMINOPHEN 5-325 MG PO TABS: 1.0000 | ORAL_TABLET | ORAL | Status: DC | PRN

## 2014-02-11 NOTE — Patient Instructions (Addendum)
Use 1-2 hydrocodone for pain. Can use acetaminophen for pain but do not go over 4000 mg a day.  Can suppelment with naprosyn as needed. Make sure to get up a walk frequently. Full brain rest. Stop at front desk for referral to PT. Use miralax for constipation. Reschedule CPX with Dr. Lorelei Pont.. move back to the week of the 28th.

## 2014-02-11 NOTE — Progress Notes (Signed)
Pre visit review using our clinic review tool, if applicable. No additional management support is needed unless otherwise documented below in the visit note. 
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
## 2014-02-13 ENCOUNTER — Emergency Department (HOSPITAL_COMMUNITY): Payer: No Typology Code available for payment source

## 2014-02-13 ENCOUNTER — Emergency Department (HOSPITAL_COMMUNITY)
Admission: EM | Admit: 2014-02-13 | Discharge: 2014-02-13 | Disposition: A | Discharge status: Home / Self Care | Payer: No Typology Code available for payment source | Attending: Emergency Medicine | Admitting: Emergency Medicine

## 2014-02-13 ENCOUNTER — Encounter (HOSPITAL_COMMUNITY): Payer: Self-pay | Admitting: Nurse Practitioner

## 2014-02-13 DIAGNOSIS — R42 Dizziness and giddiness: Secondary | ICD-10-CM | POA: Diagnosis not present

## 2014-02-13 DIAGNOSIS — S060X9D Concussion with loss of consciousness of unspecified duration, subsequent encounter: Secondary | ICD-10-CM | POA: Diagnosis not present

## 2014-02-13 DIAGNOSIS — R51 Headache: Secondary | ICD-10-CM | POA: Diagnosis present

## 2014-02-13 DIAGNOSIS — R519 Headache, unspecified: Secondary | ICD-10-CM

## 2014-02-13 DIAGNOSIS — Z791 Long term (current) use of non-steroidal anti-inflammatories (NSAID): Secondary | ICD-10-CM | POA: Insufficient documentation

## 2014-02-13 DIAGNOSIS — Z79899 Other long term (current) drug therapy: Secondary | ICD-10-CM | POA: Diagnosis not present

## 2014-02-13 DIAGNOSIS — Z792 Long term (current) use of antibiotics: Secondary | ICD-10-CM | POA: Diagnosis not present

## 2014-02-13 MED ORDER — MECLIZINE HCL 50 MG PO TABS: 50.0000 mg | ORAL_TABLET | Freq: Three times a day (TID) | ORAL | Status: DC | PRN

## 2014-02-13 MED ORDER — MECLIZINE HCL 25 MG PO TABS
50.0000 mg | ORAL_TABLET | Freq: Once | ORAL | Status: AC
  Administered 2014-02-13: 50 mg via ORAL
  Filled 2014-02-13: qty 2
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
## 2014-02-13 NOTE — ED Notes (Signed)
Patient transported to CT 

## 2014-02-13 NOTE — Discharge Instructions (Signed)
Concussion  A concussion, or closed-head injury, is a brain injury caused by a direct blow to the head or by a quick and sudden movement (jolt) of the head or neck. Concussions are usually not life-threatening. Even so, the effects of a concussion can be serious. If you have had a concussion before, you are more likely to experience concussion-like symptoms after a direct blow to the head.   CAUSES  · Direct blow to the head, such as from running into another player during a soccer game, being hit in a fight, or hitting your head on a hard surface.  · A jolt of the head or neck that causes the brain to move back and forth inside the skull, such as in a car crash.  SIGNS AND SYMPTOMS  The signs of a concussion can be hard to notice. Early on, they may be missed by you, family members, and health care providers. You may look fine but act or feel differently.  Symptoms are usually temporary, but they may last for days, weeks, or even longer. Some symptoms may appear right away while others may not show up for hours or days. Every head injury is different. Symptoms include:  · Mild to moderate headaches that will not go away.  · A feeling of pressure inside your head.  · Having more trouble than usual:  ¨ Learning or remembering things you have heard.  ¨ Answering questions.  ¨ Paying attention or concentrating.  ¨ Organizing daily tasks.  ¨ Making decisions and solving problems.  · Slowness in thinking, acting or reacting, speaking, or reading.  · Getting lost or being easily confused.  · Feeling tired all the time or lacking energy (fatigued).  · Feeling drowsy.  · Sleep disturbances.  ¨ Sleeping more than usual.  ¨ Sleeping less than usual.  ¨ Trouble falling asleep.  ¨ Trouble sleeping (insomnia).  · Loss of balance or feeling lightheaded or dizzy.  · Nausea or vomiting.  · Numbness or tingling.  · Increased sensitivity to:  ¨ Sounds.  ¨ Lights.  ¨ Distractions.  · Vision problems or eyes that tire  easily.  · Diminished sense of taste or smell.  · Ringing in the ears.  · Mood changes such as feeling sad or anxious.  · Becoming easily irritated or angry for little or no reason.  · Lack of motivation.  · Seeing or hearing things other people do not see or hear (hallucinations).  DIAGNOSIS  Your health care provider can usually diagnose a concussion based on a description of your injury and symptoms. He or she will ask whether you passed out (lost consciousness) and whether you are having trouble remembering events that happened right before and during your injury.  Your evaluation might include:  · A brain scan to look for signs of injury to the brain. Even if the test shows no injury, you may still have a concussion.  · Blood tests to be sure other problems are not present.  TREATMENT  · Concussions are usually treated in an emergency department, in urgent care, or at a clinic. You may need to stay in the hospital overnight for further treatment.  · Tell your health care provider if you are taking any medicines, including prescription medicines, over-the-counter medicines, and natural remedies. Some medicines, such as blood thinners (anticoagulants) and aspirin, may increase the chance of complications. Also tell your health care provider whether you have had alcohol or are taking illegal drugs. This information   may affect treatment.  · Your health care provider will send you home with important instructions to follow.  · How fast you will recover from a concussion depends on many factors. These factors include how severe your concussion is, what part of your brain was injured, your age, and how healthy you were before the concussion.  · Most people with mild injuries recover fully. Recovery can take time. In general, recovery is slower in older persons. Also, persons who have had a concussion in the past or have other medical problems may find that it takes longer to recover from their current injury.  HOME  CARE INSTRUCTIONS  General Instructions  · Carefully follow the directions your health care provider gave you.  · Only take over-the-counter or prescription medicines for pain, discomfort, or fever as directed by your health care provider.  · Take only those medicines that your health care provider has approved.  · Do not drink alcohol until your health care provider says you are well enough to do so. Alcohol and certain other drugs may slow your recovery and can put you at risk of further injury.  · If it is harder than usual to remember things, write them down.  · If you are easily distracted, try to do one thing at a time. For example, do not try to watch TV while fixing dinner.  · Talk with family members or close friends when making important decisions.  · Keep all follow-up appointments. Repeated evaluation of your symptoms is recommended for your recovery.  · Watch your symptoms and tell others to do the same. Complications sometimes occur after a concussion. Older adults with a brain injury may have a higher risk of serious complications, such as a blood clot on the brain.  · Tell your teachers, school nurse, school counselor, coach, athletic trainer, or work manager about your injury, symptoms, and restrictions. Tell them about what you can or cannot do. They should watch for:  ¨ Increased problems with attention or concentration.  ¨ Increased difficulty remembering or learning new information.  ¨ Increased time needed to complete tasks or assignments.  ¨ Increased irritability or decreased ability to cope with stress.  ¨ Increased symptoms.  · Rest. Rest helps the brain to heal. Make sure you:  ¨ Get plenty of sleep at night. Avoid staying up late at night.  ¨ Keep the same bedtime hours on weekends and weekdays.  ¨ Rest during the day. Take daytime naps or rest breaks when you feel tired.  · Limit activities that require a lot of thought or concentration. These include:  ¨ Doing homework or job-related  work.  ¨ Watching TV.  ¨ Working on the computer.  · Avoid any situation where there is potential for another head injury (football, hockey, soccer, basketball, martial arts, downhill snow sports and horseback riding). Your condition will get worse every time you experience a concussion. You should avoid these activities until you are evaluated by the appropriate follow-up health care providers.  Returning To Your Regular Activities  You will need to return to your normal activities slowly, not all at once. You must give your body and brain enough time for recovery.  · Do not return to sports or other athletic activities until your health care provider tells you it is safe to do so.  · Ask your health care provider when you can drive, ride a bicycle, or operate heavy machinery. Your ability to react may be slower after a   brain injury. Never do these activities if you are dizzy.  · Ask your health care provider about when you can return to work or school.  Preventing Another Concussion  It is very important to avoid another brain injury, especially before you have recovered. In rare cases, another injury can lead to permanent brain damage, brain swelling, or death. The risk of this is greatest during the first 7-10 days after a head injury. Avoid injuries by:  · Wearing a seat belt when riding in a car.  · Drinking alcohol only in moderation.  · Wearing a helmet when biking, skiing, skateboarding, skating, or doing similar activities.  · Avoiding activities that could lead to a second concussion, such as contact or recreational sports, until your health care provider says it is okay.  · Taking safety measures in your home.  ¨ Remove clutter and tripping hazards from floors and stairways.  ¨ Use grab bars in bathrooms and handrails by stairs.  ¨ Place non-slip mats on floors and in bathtubs.  ¨ Improve lighting in dim areas.  SEEK MEDICAL CARE IF:  · You have increased problems paying attention or  concentrating.  · You have increased difficulty remembering or learning new information.  · You need more time to complete tasks or assignments than before.  · You have increased irritability or decreased ability to cope with stress.  · You have more symptoms than before.  Seek medical care if you have any of the following symptoms for more than 2 weeks after your injury:  · Lasting (chronic) headaches.  · Dizziness or balance problems.  · Nausea.  · Vision problems.  · Increased sensitivity to noise or light.  · Depression or mood swings.  · Anxiety or irritability.  · Memory problems.  · Difficulty concentrating or paying attention.  · Sleep problems.  · Feeling tired all the time.  SEEK IMMEDIATE MEDICAL CARE IF:  · You have severe or worsening headaches. These may be a sign of a blood clot in the brain.  · You have weakness (even if only in one hand, leg, or part of the face).  · You have numbness.  · You have decreased coordination.  · You vomit repeatedly.  · You have increased sleepiness.  · One pupil is larger than the other.  · You have convulsions.  · You have slurred speech.  · You have increased confusion. This may be a sign of a blood clot in the brain.  · You have increased restlessness, agitation, or irritability.  · You are unable to recognize people or places.  · You have neck pain.  · It is difficult to wake you up.  · You have unusual behavior changes.  · You lose consciousness.  MAKE SURE YOU:  · Understand these instructions.  · Will watch your condition.  · Will get help right away if you are not doing well or get worse.  Document Released: 05/04/2003 Document Revised: 02/16/2013 Document Reviewed: 09/03/2012  [employer]® Patient Information ©2015 [employer]. This information is not intended to replace advice given to you by your health care provider. Make sure you discuss any questions you have with your health care provider.
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
## 2014-02-13 NOTE — ED Notes (Signed)
Pt ambulated with EDP.

## 2014-02-13 NOTE — ED Notes (Signed)
MD at bedside. 

## 2014-02-14 ENCOUNTER — Ambulatory Visit: Payer: 59 | Admitting: Family Medicine

## 2014-02-14 ENCOUNTER — Encounter: Payer: Medicare Other | Admitting: Family Medicine

## 2014-02-15 DIAGNOSIS — Z96652 Presence of left artificial knee joint: Secondary | ICD-10-CM | POA: Insufficient documentation

## 2014-02-23 ENCOUNTER — Encounter: Payer: Self-pay | Admitting: Family Medicine

## 2014-02-23 ENCOUNTER — Ambulatory Visit (INDEPENDENT_AMBULATORY_CARE_PROVIDER_SITE_OTHER): Payer: Commercial Managed Care - HMO | Admitting: Family Medicine

## 2014-02-23 VITALS — BP 112/70 | HR 84 | Temp 98.1°F | Ht 69.0 in | Wt 200.2 lb

## 2014-02-23 DIAGNOSIS — Z23 Encounter for immunization: Secondary | ICD-10-CM

## 2014-02-23 DIAGNOSIS — S060X9A Concussion with loss of consciousness of unspecified duration, initial encounter: Secondary | ICD-10-CM

## 2014-02-23 DIAGNOSIS — Z0000 Encounter for general adult medical examination without abnormal findings: Secondary | ICD-10-CM

## 2014-02-23 DIAGNOSIS — T07XXXA Unspecified multiple injuries, initial encounter: Secondary | ICD-10-CM

## 2014-02-23 DIAGNOSIS — F0781 Postconcussional syndrome: Secondary | ICD-10-CM

## 2014-02-23 DIAGNOSIS — T148 Other injury of unspecified body region: Secondary | ICD-10-CM

## 2014-02-23 NOTE — Progress Notes (Signed)
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 The patient's FH and SH was reviewed, including their home life, tobacco status, and drug and alcohol status.  I have personally reviewed the Medicare Annual Wellness questionnaire and have noted 1. The patient's medical and social history 2. Their use of alcohol, tobacco or illicit drugs 3. Their current medications and supplements 4. The patient's functional ability including ADL's, fall risks, home safety risks and hearing or visual             impairment. 5. Diet and physical activities 6. Evidence for depression or mood disorders  The patients weight, height, BMI and visual acuity have been recorded in the chart I have made referrals, counseling and provided education to the patient based review of the above and I have provided the pt with a written personalized care plan for preventive services.  I have provided the patient with a copy of your personalized  plan for preventive services. Instructed to take the time to review along with their updated medication list.  Doing okay from a health maintenance standpoint.  Follow-up: No Follow-up on file. Or follow-up in 1 year for complete physical examination  New Prescriptions   No medications on file   Orders Placed This Encounter  Procedures  . Flu Vaccine QUAD 36+ mos PF IM (Fluarix Quad PF)    Signed,  Willard Madrigal T. Tarea Skillman, MD   Patient's Medications  New Prescriptions   No medications on file  Previous Medications  ACETAMINOPHEN (TYLENOL) 500 MG TABLET    Take 500 mg by mouth every 6 (six) hours as needed.   CEPHALEXIN (KEFLEX) 500 MG CAPSULE    Take 1 capsule (500 mg total) by mouth 4 (four) times daily.   IBUPROFEN (ADVIL,MOTRIN) 200 MG TABLET    Take 200 mg by mouth. Take 4 by mouth in the am and pm    NEOMYCIN-BACITRACIN-POLYMYXIN (NEOSPORIN) OINTMENT    Apply 1 application topically every 12 (twelve) hours. apply to eye  Modified Medications   No medications on file  Discontinued Medications   BISACODYL (DULCOLAX) 5 MG EC TABLET    Take 5 mg by mouth daily as needed for moderate constipation.   HYDROCODONE-ACETAMINOPHEN (NORCO/VICODIN) 5-325 MG PER TABLET    Take 1-2 tablets by mouth every 4 (four) hours as needed.   MECLIZINE (ANTIVERT) 50 MG TABLET    Take 1 tablet (50 mg total) by mouth 3 (three) times daily as needed.   NAPROXEN (NAPROSYN) 500 MG TABLET    Take 1 tablet (500 mg total) by mouth 2 (two) times daily with a meal.   ONDANSETRON (ZOFRAN ODT) 4 MG DISINTEGRATING TABLET    Take 1 tablet (4 mg total) by mouth every 8 (eight) hours as needed for nausea.   POLYETHYLENE GLYCOL (MIRALAX / GLYCOLAX) PACKET    Take 17 g by mouth daily.

## 2014-02-23 NOTE — Progress Notes (Signed)
Pre visit review using our clinic review tool, if applicable. No additional management support is needed unless otherwise documented below in the visit note. 

## 2014-04-19 ENCOUNTER — Other Ambulatory Visit: Payer: Self-pay | Admitting: Dermatology

## 2014-12-29 ENCOUNTER — Telehealth: Payer: Self-pay

## 2014-12-29 ENCOUNTER — Ambulatory Visit (INDEPENDENT_AMBULATORY_CARE_PROVIDER_SITE_OTHER): Payer: PPO

## 2014-12-29 DIAGNOSIS — Z23 Encounter for immunization: Secondary | ICD-10-CM

## 2014-12-29 NOTE — Telephone Encounter (Signed)
Immunization History  Administered Date(s) Administered  . Influenza Split 12/17/2010  . Influenza Whole 12/10/2007  . Influenza,inj,Quad PF,36+ Mos 02/03/2013, 02/23/2014, 12/29/2014  . Pneumococcal Polysaccharide-23 02/10/2013  . Tdap 09/19/2010, 02/09/2014  . Zoster 12/28/2010    All adults 65 or older should get prevnar and pneumovax spaced 1 year apart.   If you are ever unsure, you can always ask Barnett Applebaum, and if she is not here then [facility] or Butch Penny should be able to help. There is an open order so you can give the pneumonia vaccines for people when needed.  Thanks! Electronically Signed  By: Owens Loffler, MD On: 12/29/2014 3:26 PM

## 2014-12-29 NOTE — Telephone Encounter (Signed)
Unable to do that as PCP has to order---we cannot make that call with out provider's standing order--I will call pt and let him know  Thanks   Left detailed msg on VM per HIPAA letting pt know to schedule nurse visit to receive Prevnar

## 2014-12-29 NOTE — Telephone Encounter (Signed)
Pt came into office for flu shot. Pt wanted to know if he needed another pneumo vaccine. I did tell him I saw Pneumo 23 in chart but not Prevnar---please advise if pt can get next pneumo injection---pt request to make sure to call back on mobile number

## 2014-12-29 NOTE — Telephone Encounter (Signed)
I would just like to confirm this question, which confuses me.   In my 7+ years at [facility], we have had standing division-wide orders for our nursing staff to give Pneumovax and Prevnar when indicated.   Has this changed? The open orders should be in place to help facilitate vaccination.   I wanted to involve Barnett Applebaum to help clear up and educate if needed.  Thank-you. Electronically Signed  By: Owens Loffler, MD On: 12/29/2014 3:37 PM

## 2015-01-04 ENCOUNTER — Ambulatory Visit: Payer: PPO

## 2015-02-07 ENCOUNTER — Ambulatory Visit: Payer: PPO

## 2015-02-14 ENCOUNTER — Other Ambulatory Visit: Payer: Self-pay | Admitting: Family Medicine

## 2015-02-14 DIAGNOSIS — Z125 Encounter for screening for malignant neoplasm of prostate: Secondary | ICD-10-CM

## 2015-02-14 DIAGNOSIS — Z79899 Other long term (current) drug therapy: Secondary | ICD-10-CM

## 2015-02-14 DIAGNOSIS — E785 Hyperlipidemia, unspecified: Secondary | ICD-10-CM

## 2015-02-15 ENCOUNTER — Ambulatory Visit: Payer: PPO

## 2015-02-16 ENCOUNTER — Ambulatory Visit (INDEPENDENT_AMBULATORY_CARE_PROVIDER_SITE_OTHER): Payer: PPO

## 2015-02-16 DIAGNOSIS — Z23 Encounter for immunization: Secondary | ICD-10-CM | POA: Diagnosis not present

## 2015-02-22 ENCOUNTER — Other Ambulatory Visit (INDEPENDENT_AMBULATORY_CARE_PROVIDER_SITE_OTHER): Payer: PPO

## 2015-02-22 DIAGNOSIS — Z125 Encounter for screening for malignant neoplasm of prostate: Secondary | ICD-10-CM | POA: Diagnosis not present

## 2015-02-22 DIAGNOSIS — Z79899 Other long term (current) drug therapy: Secondary | ICD-10-CM | POA: Diagnosis not present

## 2015-02-22 DIAGNOSIS — E785 Hyperlipidemia, unspecified: Secondary | ICD-10-CM

## 2015-02-22 LAB — CBC WITH DIFFERENTIAL/PLATELET
BASOS PCT: 0.6 % (ref 0.0–3.0)
Basophils Absolute: 0.1 10*3/uL (ref 0.0–0.1)
Eosinophils Absolute: 0.4 10*3/uL (ref 0.0–0.7)
Eosinophils Relative: 4.2 % (ref 0.0–5.0)
HCT: 41.2 % (ref 39.0–52.0)
HEMOGLOBIN: 13.7 g/dL (ref 13.0–17.0)
LYMPHS ABS: 3.3 10*3/uL (ref 0.7–4.0)
Lymphocytes Relative: 31.2 % (ref 12.0–46.0)
MCHC: 33.2 g/dL (ref 30.0–36.0)
MCV: 92.8 fl (ref 78.0–100.0)
MONO ABS: 0.8 10*3/uL (ref 0.1–1.0)
Monocytes Relative: 7.8 % (ref 3.0–12.0)
Neutro Abs: 6 10*3/uL (ref 1.4–7.7)
Neutrophils Relative %: 56.2 % (ref 43.0–77.0)
Platelets: 370 10*3/uL (ref 150.0–400.0)
RBC: 4.44 Mil/uL (ref 4.22–5.81)
RDW: 13.1 % (ref 11.5–15.5)
WBC: 10.6 10*3/uL — ABNORMAL HIGH (ref 4.0–10.5)

## 2015-02-22 LAB — HEPATIC FUNCTION PANEL
ALT: 25 U/L (ref 0–53)
AST: 27 U/L (ref 0–37)
Albumin: 3.9 g/dL (ref 3.5–5.2)
Alkaline Phosphatase: 74 U/L (ref 39–117)
BILIRUBIN TOTAL: 0.6 mg/dL (ref 0.2–1.2)
Bilirubin, Direct: 0.1 mg/dL (ref 0.0–0.3)
Total Protein: 7.6 g/dL (ref 6.0–8.3)

## 2015-02-22 LAB — BASIC METABOLIC PANEL
BUN: 15 mg/dL (ref 6–23)
CALCIUM: 9.2 mg/dL (ref 8.4–10.5)
CO2: 27 mEq/L (ref 19–32)
Chloride: 102 mEq/L (ref 96–112)
Creatinine, Ser: 0.78 mg/dL (ref 0.40–1.50)
GFR: 105.11 mL/min (ref >60.00)
Glucose, Bld: 93 mg/dL (ref 70–99)
Potassium: 4.4 mEq/L (ref 3.5–5.1)
Sodium: 137 mEq/L (ref 135–145)

## 2015-02-22 LAB — PSA: PSA: 1.02 ng/mL (ref 0.10–4.00)

## 2015-02-22 LAB — LIPID PANEL
CHOLESTEROL: 173 mg/dL (ref 0–200)
HDL: 62.7 mg/dL (ref >39.00)
LDL Cholesterol: 96 mg/dL (ref 0–99)
NonHDL: 110.37
Total CHOL/HDL Ratio: 3
Triglycerides: 74 mg/dL (ref 0.0–149.0)
VLDL: 14.8 mg/dL (ref 0.0–40.0)

## 2015-03-01 ENCOUNTER — Ambulatory Visit (INDEPENDENT_AMBULATORY_CARE_PROVIDER_SITE_OTHER): Payer: PPO | Admitting: Family Medicine

## 2015-03-01 ENCOUNTER — Encounter: Payer: Self-pay | Admitting: Family Medicine

## 2015-03-01 VITALS — HR 69 | Temp 97.5°F | Ht 68.5 in | Wt 192.2 lb

## 2015-03-01 DIAGNOSIS — Z0000 Encounter for general adult medical examination without abnormal findings: Secondary | ICD-10-CM

## 2015-03-01 NOTE — Progress Notes (Signed)
Pre visit review using our clinic review tool, if applicable. No additional management support is needed unless otherwise documented below in the visit note. 
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
## 2015-05-01 DIAGNOSIS — D1801 Hemangioma of skin and subcutaneous tissue: Secondary | ICD-10-CM | POA: Diagnosis not present

## 2015-05-01 DIAGNOSIS — D485 Neoplasm of uncertain behavior of skin: Secondary | ICD-10-CM | POA: Diagnosis not present

## 2015-05-01 DIAGNOSIS — L812 Freckles: Secondary | ICD-10-CM | POA: Diagnosis not present

## 2015-05-01 DIAGNOSIS — Z85828 Personal history of other malignant neoplasm of skin: Secondary | ICD-10-CM | POA: Diagnosis not present

## 2015-05-01 DIAGNOSIS — D0439 Carcinoma in situ of skin of other parts of face: Secondary | ICD-10-CM | POA: Diagnosis not present

## 2015-05-01 DIAGNOSIS — L821 Other seborrheic keratosis: Secondary | ICD-10-CM | POA: Diagnosis not present

## 2015-05-01 DIAGNOSIS — L570 Actinic keratosis: Secondary | ICD-10-CM | POA: Diagnosis not present
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
## 2015-11-06 DIAGNOSIS — L821 Other seborrheic keratosis: Secondary | ICD-10-CM | POA: Diagnosis not present

## 2015-11-06 DIAGNOSIS — L814 Other melanin hyperpigmentation: Secondary | ICD-10-CM | POA: Diagnosis not present

## 2015-11-06 DIAGNOSIS — D485 Neoplasm of uncertain behavior of skin: Secondary | ICD-10-CM | POA: Diagnosis not present

## 2015-11-06 DIAGNOSIS — C44519 Basal cell carcinoma of skin of other part of trunk: Secondary | ICD-10-CM | POA: Diagnosis not present

## 2015-11-06 DIAGNOSIS — Z85828 Personal history of other malignant neoplasm of skin: Secondary | ICD-10-CM | POA: Diagnosis not present

## 2015-12-18 ENCOUNTER — Ambulatory Visit: Payer: PPO | Admitting: Family Medicine

## 2015-12-19 ENCOUNTER — Ambulatory Visit (INDEPENDENT_AMBULATORY_CARE_PROVIDER_SITE_OTHER): Payer: PPO

## 2015-12-19 DIAGNOSIS — Z23 Encounter for immunization: Secondary | ICD-10-CM

## 2015-12-25 ENCOUNTER — Encounter: Payer: Self-pay | Admitting: Family Medicine

## 2015-12-25 DIAGNOSIS — Z86010 Personal history of colonic polyps: Secondary | ICD-10-CM | POA: Diagnosis not present

## 2015-12-25 DIAGNOSIS — D125 Benign neoplasm of sigmoid colon: Secondary | ICD-10-CM | POA: Diagnosis not present

## 2015-12-25 DIAGNOSIS — K635 Polyp of colon: Secondary | ICD-10-CM | POA: Diagnosis not present

## 2015-12-25 DIAGNOSIS — D124 Benign neoplasm of descending colon: Secondary | ICD-10-CM | POA: Diagnosis not present

## 2015-12-25 DIAGNOSIS — K5730 Diverticulosis of large intestine without perforation or abscess without bleeding: Secondary | ICD-10-CM | POA: Diagnosis not present

## 2015-12-25 LAB — HM COLONOSCOPY

## 2015-12-28 DIAGNOSIS — K635 Polyp of colon: Secondary | ICD-10-CM | POA: Diagnosis not present

## 2016-01-04 ENCOUNTER — Encounter: Payer: Self-pay | Admitting: Family Medicine

## 2016-02-20 ENCOUNTER — Other Ambulatory Visit: Payer: Self-pay | Admitting: Family Medicine

## 2016-02-20 DIAGNOSIS — Z1159 Encounter for screening for other viral diseases: Secondary | ICD-10-CM

## 2016-02-20 DIAGNOSIS — Z79899 Other long term (current) drug therapy: Secondary | ICD-10-CM

## 2016-02-20 DIAGNOSIS — Z125 Encounter for screening for malignant neoplasm of prostate: Secondary | ICD-10-CM

## 2016-02-20 DIAGNOSIS — E7849 Other hyperlipidemia: Secondary | ICD-10-CM

## 2016-02-27 ENCOUNTER — Other Ambulatory Visit (INDEPENDENT_AMBULATORY_CARE_PROVIDER_SITE_OTHER): Payer: PPO

## 2016-02-27 ENCOUNTER — Other Ambulatory Visit: Payer: PPO

## 2016-02-27 DIAGNOSIS — Z79899 Other long term (current) drug therapy: Secondary | ICD-10-CM

## 2016-02-27 DIAGNOSIS — E7849 Other hyperlipidemia: Secondary | ICD-10-CM

## 2016-02-27 DIAGNOSIS — E784 Other hyperlipidemia: Secondary | ICD-10-CM

## 2016-02-27 DIAGNOSIS — Z125 Encounter for screening for malignant neoplasm of prostate: Secondary | ICD-10-CM

## 2016-02-27 DIAGNOSIS — Z1159 Encounter for screening for other viral diseases: Secondary | ICD-10-CM

## 2016-02-27 LAB — BASIC METABOLIC PANEL
BUN: 15 mg/dL (ref 6–23)
CHLORIDE: 101 meq/L (ref 96–112)
CO2: 30 mEq/L (ref 19–32)
Calcium: 9.1 mg/dL (ref 8.4–10.5)
Creatinine, Ser: 0.83 mg/dL (ref 0.40–1.50)
GFR: 97.54 mL/min (ref >60.00)
GLUCOSE: 106 mg/dL — AB (ref 70–99)
POTASSIUM: 4.3 meq/L (ref 3.5–5.1)
Sodium: 136 mEq/L (ref 135–145)

## 2016-02-27 LAB — CBC WITH DIFFERENTIAL/PLATELET
BASOS PCT: 0.6 % (ref 0.0–3.0)
Basophils Absolute: 0.1 10*3/uL (ref 0.0–0.1)
Eosinophils Absolute: 0.6 10*3/uL (ref 0.0–0.7)
Eosinophils Relative: 4.4 % (ref 0.0–5.0)
HCT: 40.9 % (ref 39.0–52.0)
Hemoglobin: 14.4 g/dL (ref 13.0–17.0)
LYMPHS ABS: 2.9 10*3/uL (ref 0.7–4.0)
Lymphocytes Relative: 23.6 % (ref 12.0–46.0)
MCHC: 35.2 g/dL (ref 30.0–36.0)
MCV: 92.6 fl (ref 78.0–100.0)
MONO ABS: 0.9 10*3/uL (ref 0.1–1.0)
Monocytes Relative: 7.2 % (ref 3.0–12.0)
NEUTROS PCT: 64.2 % (ref 43.0–77.0)
Neutro Abs: 8 10*3/uL — ABNORMAL HIGH (ref 1.4–7.7)
PLATELETS: 297 10*3/uL (ref 150.0–400.0)
RBC: 4.42 Mil/uL (ref 4.22–5.81)
RDW: 12.3 % (ref 11.5–15.5)
WBC: 12.4 10*3/uL — ABNORMAL HIGH (ref 4.0–10.5)

## 2016-02-27 LAB — HEPATIC FUNCTION PANEL
ALK PHOS: 61 U/L (ref 39–117)
ALT: 21 U/L (ref 0–53)
AST: 23 U/L (ref 0–37)
Albumin: 4.1 g/dL (ref 3.5–5.2)
BILIRUBIN DIRECT: 0.1 mg/dL (ref 0.0–0.3)
BILIRUBIN TOTAL: 0.4 mg/dL (ref 0.2–1.2)
Total Protein: 7.4 g/dL (ref 6.0–8.3)

## 2016-02-27 LAB — PSA, MEDICARE: PSA: 0.86 ng/mL (ref 0.10–4.00)

## 2016-02-27 LAB — LIPID PANEL
CHOL/HDL RATIO: 2
Cholesterol: 180 mg/dL (ref 0–200)
HDL: 79.5 mg/dL (ref >39.00)
LDL CALC: 89 mg/dL (ref 0–99)
NonHDL: 100.83
Triglycerides: 59 mg/dL (ref 0.0–149.0)
VLDL: 11.8 mg/dL (ref 0.0–40.0)

## 2016-02-27 LAB — HEPATITIS C ANTIBODY: HCV Ab: NEGATIVE

## 2016-03-04 ENCOUNTER — Ambulatory Visit (INDEPENDENT_AMBULATORY_CARE_PROVIDER_SITE_OTHER): Payer: PPO | Admitting: Family Medicine

## 2016-03-04 ENCOUNTER — Encounter: Payer: Self-pay | Admitting: Family Medicine

## 2016-03-04 VITALS — BP 130/86 | HR 67 | Temp 97.8°F | Ht 68.75 in | Wt 191.5 lb

## 2016-03-04 DIAGNOSIS — Z0000 Encounter for general adult medical examination without abnormal findings: Secondary | ICD-10-CM | POA: Diagnosis not present
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
## 2016-03-04 NOTE — Progress Notes (Signed)
Pre visit review using our clinic review tool, if applicable. No additional management support is needed unless otherwise documented below in the visit note. 

## 2016-05-06 DIAGNOSIS — D044 Carcinoma in situ of skin of scalp and neck: Secondary | ICD-10-CM | POA: Diagnosis not present

## 2016-05-06 DIAGNOSIS — Z85828 Personal history of other malignant neoplasm of skin: Secondary | ICD-10-CM | POA: Diagnosis not present

## 2016-05-06 DIAGNOSIS — L821 Other seborrheic keratosis: Secondary | ICD-10-CM | POA: Diagnosis not present

## 2016-05-06 DIAGNOSIS — L738 Other specified follicular disorders: Secondary | ICD-10-CM | POA: Diagnosis not present

## 2016-05-06 DIAGNOSIS — L578 Other skin changes due to chronic exposure to nonionizing radiation: Secondary | ICD-10-CM | POA: Diagnosis not present

## 2016-05-06 DIAGNOSIS — D485 Neoplasm of uncertain behavior of skin: Secondary | ICD-10-CM | POA: Diagnosis not present

## 2016-05-06 DIAGNOSIS — L218 Other seborrheic dermatitis: Secondary | ICD-10-CM | POA: Diagnosis not present

## 2016-05-06 DIAGNOSIS — D1801 Hemangioma of skin and subcutaneous tissue: Secondary | ICD-10-CM | POA: Diagnosis not present

## 2016-11-11 DIAGNOSIS — D2261 Melanocytic nevi of right upper limb, including shoulder: Secondary | ICD-10-CM | POA: Diagnosis not present

## 2016-11-11 DIAGNOSIS — Z85828 Personal history of other malignant neoplasm of skin: Secondary | ICD-10-CM | POA: Diagnosis not present

## 2016-11-11 DIAGNOSIS — L821 Other seborrheic keratosis: Secondary | ICD-10-CM | POA: Diagnosis not present

## 2016-11-11 DIAGNOSIS — D1801 Hemangioma of skin and subcutaneous tissue: Secondary | ICD-10-CM | POA: Diagnosis not present

## 2016-11-11 DIAGNOSIS — L570 Actinic keratosis: Secondary | ICD-10-CM | POA: Diagnosis not present

## 2016-12-10 ENCOUNTER — Ambulatory Visit (INDEPENDENT_AMBULATORY_CARE_PROVIDER_SITE_OTHER): Payer: PPO

## 2016-12-10 DIAGNOSIS — Z23 Encounter for immunization: Secondary | ICD-10-CM | POA: Diagnosis not present

## 2017-03-03 ENCOUNTER — Other Ambulatory Visit (INDEPENDENT_AMBULATORY_CARE_PROVIDER_SITE_OTHER): Payer: PPO

## 2017-03-03 DIAGNOSIS — E785 Hyperlipidemia, unspecified: Secondary | ICD-10-CM

## 2017-03-03 DIAGNOSIS — Z79899 Other long term (current) drug therapy: Secondary | ICD-10-CM | POA: Diagnosis not present

## 2017-03-03 DIAGNOSIS — Z125 Encounter for screening for malignant neoplasm of prostate: Secondary | ICD-10-CM | POA: Diagnosis not present

## 2017-03-03 LAB — BASIC METABOLIC PANEL
BUN: 13 mg/dL (ref 6–23)
CALCIUM: 9.2 mg/dL (ref 8.4–10.5)
CO2: 30 mEq/L (ref 19–32)
Chloride: 101 mEq/L (ref 96–112)
Creatinine, Ser: 0.76 mg/dL (ref 0.40–1.50)
GFR: 107.67 mL/min (ref >60.00)
GLUCOSE: 107 mg/dL — AB (ref 70–99)
Potassium: 4.4 mEq/L (ref 3.5–5.1)
Sodium: 138 mEq/L (ref 135–145)

## 2017-03-03 LAB — CBC WITH DIFFERENTIAL/PLATELET
BASOS ABS: 0.1 10*3/uL (ref 0.0–0.1)
Basophils Relative: 0.9 % (ref 0.0–3.0)
EOS ABS: 0.7 10*3/uL (ref 0.0–0.7)
Eosinophils Relative: 7.4 % — ABNORMAL HIGH (ref 0.0–5.0)
HCT: 42.2 % (ref 39.0–52.0)
Hemoglobin: 14 g/dL (ref 13.0–17.0)
LYMPHS ABS: 3 10*3/uL (ref 0.7–4.0)
LYMPHS PCT: 31.9 % (ref 12.0–46.0)
MCHC: 33.3 g/dL (ref 30.0–36.0)
MCV: 95.1 fl (ref 78.0–100.0)
MONO ABS: 0.8 10*3/uL (ref 0.1–1.0)
Monocytes Relative: 8.3 % (ref 3.0–12.0)
NEUTROS PCT: 51.5 % (ref 43.0–77.0)
Neutro Abs: 4.9 10*3/uL (ref 1.4–7.7)
Platelets: 301 10*3/uL (ref 150.0–400.0)
RBC: 4.44 Mil/uL (ref 4.22–5.81)
RDW: 12.7 % (ref 11.5–15.5)
WBC: 9.5 10*3/uL (ref 4.0–10.5)

## 2017-03-03 LAB — LIPID PANEL
Cholesterol: 172 mg/dL (ref 0–200)
HDL: 75.8 mg/dL (ref >39.00)
LDL Cholesterol: 85 mg/dL (ref 0–99)
NonHDL: 96.67
Total CHOL/HDL Ratio: 2
Triglycerides: 58 mg/dL (ref 0.0–149.0)
VLDL: 11.6 mg/dL (ref 0.0–40.0)

## 2017-03-03 LAB — HEPATIC FUNCTION PANEL
ALBUMIN: 4.1 g/dL (ref 3.5–5.2)
ALK PHOS: 66 U/L (ref 39–117)
ALT: 22 U/L (ref 0–53)
AST: 23 U/L (ref 0–37)
Bilirubin, Direct: 0.1 mg/dL (ref 0.0–0.3)
Total Bilirubin: 0.6 mg/dL (ref 0.2–1.2)
Total Protein: 7.5 g/dL (ref 6.0–8.3)

## 2017-03-03 LAB — PSA, MEDICARE: PSA: 0.81 ng/ml (ref 0.10–4.00)

## 2017-03-07 ENCOUNTER — Encounter: Payer: Self-pay | Admitting: *Deleted

## 2017-03-10 ENCOUNTER — Ambulatory Visit (INDEPENDENT_AMBULATORY_CARE_PROVIDER_SITE_OTHER): Payer: PPO | Admitting: Family Medicine

## 2017-03-10 ENCOUNTER — Encounter: Payer: Self-pay | Admitting: Family Medicine

## 2017-03-10 ENCOUNTER — Other Ambulatory Visit: Payer: Self-pay

## 2017-03-10 VITALS — BP 130/70 | HR 79 | Temp 98.6°F | Ht 69.0 in | Wt 198.0 lb

## 2017-03-10 DIAGNOSIS — Z0000 Encounter for general adult medical examination without abnormal findings: Secondary | ICD-10-CM
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
## 2017-05-14 ENCOUNTER — Telehealth: Payer: Self-pay | Admitting: Internal Medicine

## 2017-05-14 NOTE — Telephone Encounter (Signed)
Patient states that he is a friend of Dr. Vena Rua father. He says that he is not having any problems but would like to transfer care to Dr. Hilarie Fredrickson for next colonoscopy. Records placed on his desk for review.

## 2017-05-16 NOTE — Telephone Encounter (Signed)
Dr. Hilarie Fredrickson reviewed records and has accepted patient. Next colon 12/2020. Recall colon has been entered. Patient states that he will callback if he needs an OV.

## 2017-08-19 ENCOUNTER — Encounter (HOSPITAL_COMMUNITY): Payer: Self-pay

## 2017-08-19 ENCOUNTER — Ambulatory Visit: Payer: Self-pay

## 2017-08-19 ENCOUNTER — Ambulatory Visit (HOSPITAL_COMMUNITY)
Admission: EM | Admit: 2017-08-19 | Discharge: 2017-08-19 | Disposition: A | Discharge status: Nursing Home (Includes ICF) | Payer: PPO

## 2017-08-19 ENCOUNTER — Emergency Department (HOSPITAL_COMMUNITY): Payer: PPO

## 2017-08-19 ENCOUNTER — Other Ambulatory Visit: Payer: Self-pay

## 2017-08-19 ENCOUNTER — Emergency Department (HOSPITAL_COMMUNITY)
Admission: EM | Admit: 2017-08-19 | Discharge: 2017-08-19 | Disposition: A | Discharge status: Home / Self Care | Payer: PPO | Attending: Emergency Medicine | Admitting: Emergency Medicine

## 2017-08-19 DIAGNOSIS — Z79899 Other long term (current) drug therapy: Secondary | ICD-10-CM | POA: Diagnosis not present

## 2017-08-19 DIAGNOSIS — S81012A Laceration without foreign body, left knee, initial encounter: Secondary | ICD-10-CM | POA: Diagnosis not present

## 2017-08-19 DIAGNOSIS — T07XXXA Unspecified multiple injuries, initial encounter: Secondary | ICD-10-CM

## 2017-08-19 DIAGNOSIS — S299XXA Unspecified injury of thorax, initial encounter: Secondary | ICD-10-CM | POA: Diagnosis not present

## 2017-08-19 DIAGNOSIS — R109 Unspecified abdominal pain: Secondary | ICD-10-CM | POA: Diagnosis not present

## 2017-08-19 DIAGNOSIS — S52022A Displaced fracture of olecranon process without intraarticular extension of left ulna, initial encounter for closed fracture: Secondary | ICD-10-CM | POA: Diagnosis not present

## 2017-08-19 DIAGNOSIS — R0789 Other chest pain: Secondary | ICD-10-CM | POA: Diagnosis not present

## 2017-08-19 DIAGNOSIS — Z7982 Long term (current) use of aspirin: Secondary | ICD-10-CM | POA: Diagnosis not present

## 2017-08-19 DIAGNOSIS — Y999 Unspecified external cause status: Secondary | ICD-10-CM | POA: Insufficient documentation

## 2017-08-19 DIAGNOSIS — S91012A Laceration without foreign body, left ankle, initial encounter: Secondary | ICD-10-CM | POA: Diagnosis not present

## 2017-08-19 DIAGNOSIS — S42122A Displaced fracture of acromial process, left shoulder, initial encounter for closed fracture: Secondary | ICD-10-CM | POA: Diagnosis not present

## 2017-08-19 DIAGNOSIS — Y9389 Activity, other specified: Secondary | ICD-10-CM | POA: Insufficient documentation

## 2017-08-19 DIAGNOSIS — Y929 Unspecified place or not applicable: Secondary | ICD-10-CM | POA: Diagnosis not present

## 2017-08-19 DIAGNOSIS — S40212A Abrasion of left shoulder, initial encounter: Secondary | ICD-10-CM | POA: Diagnosis not present

## 2017-08-19 DIAGNOSIS — S90512A Abrasion, left ankle, initial encounter: Secondary | ICD-10-CM | POA: Diagnosis not present

## 2017-08-19 DIAGNOSIS — R0781 Pleurodynia: Secondary | ICD-10-CM | POA: Diagnosis not present

## 2017-08-19 DIAGNOSIS — S80212A Abrasion, left knee, initial encounter: Secondary | ICD-10-CM | POA: Diagnosis not present

## 2017-08-19 DIAGNOSIS — S4982XA Other specified injuries of left shoulder and upper arm, initial encounter: Secondary | ICD-10-CM | POA: Diagnosis present

## 2017-08-19 LAB — COMPREHENSIVE METABOLIC PANEL
ALK PHOS: 57 U/L (ref 38–126)
ALT: 21 U/L (ref 0–44)
AST: 31 U/L (ref 15–41)
Albumin: 3.7 g/dL (ref 3.5–5.0)
Anion gap: 11 (ref 5–15)
BUN: 15 mg/dL (ref 8–23)
CALCIUM: 8.8 mg/dL — AB (ref 8.9–10.3)
CHLORIDE: 103 mmol/L (ref 98–111)
CO2: 22 mmol/L (ref 22–32)
Creatinine, Ser: 0.92 mg/dL (ref 0.61–1.24)
GLUCOSE: 105 mg/dL — AB (ref 70–99)
Potassium: 3.8 mmol/L (ref 3.5–5.1)
Sodium: 136 mmol/L (ref 135–145)
Total Bilirubin: 0.5 mg/dL (ref 0.3–1.2)
Total Protein: 7.4 g/dL (ref 6.5–8.1)

## 2017-08-19 LAB — CBC WITH DIFFERENTIAL/PLATELET
ABS IMMATURE GRANULOCYTES: 0.1 10*3/uL (ref 0.0–0.1)
BASOS PCT: 0 %
Basophils Absolute: 0.1 10*3/uL (ref 0.0–0.1)
Eosinophils Absolute: 0.2 10*3/uL (ref 0.0–0.7)
Eosinophils Relative: 1 %
HEMATOCRIT: 39.6 % (ref 39.0–52.0)
HEMOGLOBIN: 13.1 g/dL (ref 13.0–17.0)
IMMATURE GRANULOCYTES: 0 %
LYMPHS ABS: 2.7 10*3/uL (ref 0.7–4.0)
LYMPHS PCT: 17 %
MCH: 31.3 pg (ref 26.0–34.0)
MCHC: 33.1 g/dL (ref 30.0–36.0)
MCV: 94.5 fL (ref 78.0–100.0)
MONO ABS: 1.5 10*3/uL — AB (ref 0.1–1.0)
MONOS PCT: 9 %
NEUTROS ABS: 11.9 10*3/uL — AB (ref 1.7–7.7)
Neutrophils Relative %: 73 %
Platelets: 286 10*3/uL (ref 150–400)
RBC: 4.19 MIL/uL — ABNORMAL LOW (ref 4.22–5.81)
RDW: 12.3 % (ref 11.5–15.5)
WBC: 16.5 10*3/uL — ABNORMAL HIGH (ref 4.0–10.5)

## 2017-08-19 LAB — SAMPLE TO BLOOD BANK

## 2017-08-19 LAB — I-STAT CHEM 8, ED
BUN: 17 mg/dL (ref 8–23)
CHLORIDE: 102 mmol/L (ref 98–111)
CREATININE: 0.8 mg/dL (ref 0.61–1.24)
Calcium, Ion: 1.11 mmol/L — ABNORMAL LOW (ref 1.15–1.40)
Glucose, Bld: 108 mg/dL — ABNORMAL HIGH (ref 70–99)
HCT: 40 % (ref 39.0–52.0)
Hemoglobin: 13.6 g/dL (ref 13.0–17.0)
POTASSIUM: 3.9 mmol/L (ref 3.5–5.1)
SODIUM: 137 mmol/L (ref 135–145)
TCO2: 24 mmol/L (ref 22–32)

## 2017-08-19 MED ORDER — IOHEXOL 300 MG/ML  SOLN
100.0000 mL | Freq: Once | INTRAMUSCULAR | Status: AC | PRN
  Administered 2017-08-19: 100 mL via INTRAVENOUS

## 2017-08-19 MED ORDER — MELOXICAM 7.5 MG PO TABS: 7.5000 mg | ORAL_TABLET | Freq: Every day | ORAL | 0 refills | Status: DC
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
## 2017-08-19 NOTE — ED Notes (Signed)
Pt transported to xray 
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
## 2017-08-19 NOTE — Discharge Instructions (Addendum)
Continue wound care as directed. Take Mobic and Tylenol as needed for pain. Please follow-up with orthopedic doctor for further evaluation. Return to ED for any worsening symptoms, numbness in arms or legs, additional injuries or falls, loss of consciousness.

## 2017-08-20 ENCOUNTER — Ambulatory Visit: Payer: Self-pay | Admitting: *Deleted
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
## 2017-08-20 NOTE — Telephone Encounter (Signed)
He did go to ER and saw Dorie Rank - he is head MD for all [facility] system. I trust his judgement.   Oral abx aren't routinely given with abrasions. If things change, like warmth, fever, that is a little bit different.   I am happy to recheck him on Monday. My schedule is somewhat limited and I'll be out of town a lot the next couple weeks.

## 2017-08-21 DIAGNOSIS — Z8781 Personal history of (healed) traumatic fracture: Secondary | ICD-10-CM | POA: Diagnosis not present

## 2017-08-21 DIAGNOSIS — M19072 Primary osteoarthritis, left ankle and foot: Secondary | ICD-10-CM | POA: Diagnosis not present

## 2017-08-21 DIAGNOSIS — T148XXA Other injury of unspecified body region, initial encounter: Secondary | ICD-10-CM | POA: Diagnosis not present

## 2017-08-21 DIAGNOSIS — M79672 Pain in left foot: Secondary | ICD-10-CM | POA: Diagnosis not present

## 2017-08-21 DIAGNOSIS — S42125A Nondisplaced fracture of acromial process, left shoulder, initial encounter for closed fracture: Secondary | ICD-10-CM | POA: Diagnosis not present

## 2017-08-21 DIAGNOSIS — M19012 Primary osteoarthritis, left shoulder: Secondary | ICD-10-CM | POA: Diagnosis not present

## 2017-08-21 DIAGNOSIS — M7989 Other specified soft tissue disorders: Secondary | ICD-10-CM | POA: Diagnosis not present

## 2017-08-21 DIAGNOSIS — M76892 Other specified enthesopathies of left lower limb, excluding foot: Secondary | ICD-10-CM | POA: Diagnosis not present

## 2017-08-21 DIAGNOSIS — S42032A Displaced fracture of lateral end of left clavicle, initial encounter for closed fracture: Secondary | ICD-10-CM | POA: Diagnosis not present

## 2017-08-21 DIAGNOSIS — T07XXXA Unspecified multiple injuries, initial encounter: Secondary | ICD-10-CM | POA: Diagnosis not present
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
## 2017-08-25 ENCOUNTER — Ambulatory Visit (INDEPENDENT_AMBULATORY_CARE_PROVIDER_SITE_OTHER): Payer: PPO | Admitting: Family Medicine

## 2017-08-25 ENCOUNTER — Encounter: Payer: Self-pay | Admitting: Family Medicine

## 2017-08-25 VITALS — BP 110/68 | HR 84 | Temp 98.6°F | Ht 69.0 in | Wt 191.0 lb

## 2017-08-25 DIAGNOSIS — S80212A Abrasion, left knee, initial encounter: Secondary | ICD-10-CM

## 2017-08-25 DIAGNOSIS — S90812A Abrasion, left foot, initial encounter: Secondary | ICD-10-CM

## 2017-08-25 DIAGNOSIS — S42122A Displaced fracture of acromial process, left shoulder, initial encounter for closed fracture: Secondary | ICD-10-CM

## 2017-08-25 DIAGNOSIS — L089 Local infection of the skin and subcutaneous tissue, unspecified: Secondary | ICD-10-CM | POA: Diagnosis not present

## 2017-08-25 MED ORDER — DOXYCYCLINE HYCLATE 100 MG PO TABS: 100.0000 mg | ORAL_TABLET | Freq: Two times a day (BID) | ORAL | 0 refills | Status: AC

## 2017-08-25 MED ORDER — OXYCODONE HCL 5 MG PO TABS: 5.0000 mg | ORAL_TABLET | ORAL | 0 refills | Status: DC | PRN

## 2017-08-25 MED ORDER — CEFTRIAXONE SODIUM 1 G IJ SOLR
1.0000 g | Freq: Once | INTRAMUSCULAR | Status: AC
  Administered 2017-08-25: 1 g via INTRAMUSCULAR

## 2017-08-29 ENCOUNTER — Encounter: Payer: Self-pay | Admitting: Family Medicine

## 2017-08-29 ENCOUNTER — Ambulatory Visit (INDEPENDENT_AMBULATORY_CARE_PROVIDER_SITE_OTHER): Payer: PPO | Admitting: Family Medicine

## 2017-08-29 VITALS — BP 128/70 | HR 78 | Temp 98.2°F | Ht 69.0 in | Wt 190.0 lb

## 2017-08-29 DIAGNOSIS — T07XXXA Unspecified multiple injuries, initial encounter: Secondary | ICD-10-CM | POA: Diagnosis not present

## 2017-08-29 MED ORDER — OXYCODONE HCL 5 MG PO TABS: 5.0000 mg | ORAL_TABLET | ORAL | 0 refills | Status: DC | PRN

## 2017-08-29 NOTE — Patient Instructions (Signed)
Gently wash with soapy water and then rinse.  Change dressing daily.  Finish doxycycline.  Recheck with [facility] next week.  Out of work for now, presume for the next week.  Elevate leg when possible.  Take care.  Glad to see you.

## 2017-08-31 DIAGNOSIS — T07XXXA Unspecified multiple injuries, initial encounter: Secondary | ICD-10-CM | POA: Insufficient documentation

## 2017-08-31 NOTE — Assessment & Plan Note (Signed)
The non-scabbed wounds were covered with nonstick bandages and Neosporin and then wrapped with roll gauze and Curlex.  Tolerated well.  No spreading erythema.  Appears to be healing normally.  I think it will likely take several weeks for him to completely heal up.  He will likely need to be out of work in the meantime.  Continue oxycodone as needed for pain control.  Continue routine topical wound care.  Finish current prescription of doxycycline.  Follow-up with PCP next week.  See after visit summary. >25 minutes spent in face to face time with patient, >50% spent in counselling or coordination of care.
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
## 2017-09-03 ENCOUNTER — Encounter: Payer: Self-pay | Admitting: Family Medicine

## 2017-09-03 ENCOUNTER — Ambulatory Visit (INDEPENDENT_AMBULATORY_CARE_PROVIDER_SITE_OTHER): Payer: PPO | Admitting: Family Medicine

## 2017-09-03 VITALS — BP 120/78 | HR 74 | Temp 98.0°F | Ht 69.0 in | Wt 189.8 lb

## 2017-09-03 DIAGNOSIS — T07XXXA Unspecified multiple injuries, initial encounter: Secondary | ICD-10-CM

## 2017-09-03 DIAGNOSIS — S80212A Abrasion, left knee, initial encounter: Secondary | ICD-10-CM | POA: Diagnosis not present
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
## 2017-09-04 ENCOUNTER — Encounter: Payer: Self-pay | Admitting: Family Medicine

## 2017-09-10 ENCOUNTER — Telehealth: Payer: Self-pay | Admitting: Family Medicine

## 2017-09-10 NOTE — Telephone Encounter (Signed)
Pt dropped off paperwork from guardian In dr copland's in box

## 2017-09-10 NOTE — Telephone Encounter (Signed)
noted 

## 2017-09-11 DIAGNOSIS — Z0279 Encounter for issue of other medical certificate: Secondary | ICD-10-CM

## 2017-09-11 NOTE — Telephone Encounter (Signed)
Folder is on your desk per Dr. Lorelei Pont.

## 2017-09-11 NOTE — Telephone Encounter (Signed)
Patient notified as instructed by telephone and verbalized understanding. Advised patient that we will call as soon as it is ready, but can not guarantee it will be done today.

## 2017-09-11 NOTE — Telephone Encounter (Signed)
I will do my best, but I only got them yesterday. We typically have a 14 day turnaround time.

## 2017-09-11 NOTE — Telephone Encounter (Signed)
Director of HR is leaving and tomorrow might be her last day. Wants to know if it will be ready today? Please advise.

## 2017-09-11 NOTE — Telephone Encounter (Signed)
done

## 2017-09-12 NOTE — Telephone Encounter (Signed)
Pt aware paperwork is ready for pick up He will turn in Copy for pt Copy for scan Copy for billing

## 2017-09-22 ENCOUNTER — Ambulatory Visit (INDEPENDENT_AMBULATORY_CARE_PROVIDER_SITE_OTHER): Payer: PPO | Admitting: Family Medicine

## 2017-09-22 ENCOUNTER — Encounter: Payer: Self-pay | Admitting: Family Medicine

## 2017-09-22 VITALS — BP 124/68 | HR 88 | Temp 98.3°F | Ht 69.0 in | Wt 192.8 lb

## 2017-09-22 DIAGNOSIS — R202 Paresthesia of skin: Secondary | ICD-10-CM | POA: Diagnosis not present

## 2017-09-22 DIAGNOSIS — R200 Anesthesia of skin: Secondary | ICD-10-CM | POA: Diagnosis not present

## 2017-09-22 DIAGNOSIS — R600 Localized edema: Secondary | ICD-10-CM | POA: Diagnosis not present

## 2017-09-22 DIAGNOSIS — S81802D Unspecified open wound, left lower leg, subsequent encounter: Secondary | ICD-10-CM | POA: Diagnosis not present

## 2017-09-22 DIAGNOSIS — T07XXXA Unspecified multiple injuries, initial encounter: Secondary | ICD-10-CM | POA: Diagnosis not present

## 2017-09-22 DIAGNOSIS — M25672 Stiffness of left ankle, not elsewhere classified: Secondary | ICD-10-CM | POA: Diagnosis not present
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
## 2017-09-23 ENCOUNTER — Telehealth: Payer: Self-pay | Admitting: Family Medicine

## 2017-09-23 ENCOUNTER — Encounter: Payer: Self-pay | Admitting: Family Medicine

## 2017-09-23 NOTE — Telephone Encounter (Signed)
Noted  

## 2017-09-23 NOTE — Telephone Encounter (Signed)
Guardian paperwork in Dr Copland's in box

## 2017-09-25 ENCOUNTER — Telehealth: Payer: Self-pay | Admitting: *Deleted

## 2017-09-25 NOTE — Telephone Encounter (Signed)
They are calling the wrong office; this is a Owens Loffler MD patient, please send note to his office. Thanks/SLS    Copied from [place] [id number]. Topic: General - Other >> Sep 22, 2017  2:33 PM Yvette Rack wrote: Reason for CRM: Rademaker from short term disability [phone] calling for pt dates of out of work  DX codes and tretment  >> September 28, 2017  1:11 PM Synthia Innocent wrote: Opal Sidles calling again, requesting call back [phone]

## 2017-09-26 NOTE — Telephone Encounter (Signed)
Spoke to Kyle [name] received paperwork faxed 09/24/17 [name] stated they have everything they need right now.  [name] is aware there will be another form faxed as soon as dr copland fills it out

## 2017-09-26 NOTE — Telephone Encounter (Signed)
Left message asking Opal Sidles to call the office

## 2017-09-30 NOTE — Telephone Encounter (Signed)
Paperwork faxed Pt aware Copy for scan Copy for pt

## 2017-10-13 ENCOUNTER — Ambulatory Visit (INDEPENDENT_AMBULATORY_CARE_PROVIDER_SITE_OTHER): Payer: PPO | Admitting: Family Medicine

## 2017-10-13 ENCOUNTER — Encounter: Payer: Self-pay | Admitting: Family Medicine

## 2017-10-13 VITALS — BP 120/64 | HR 83 | Temp 98.4°F | Ht 69.0 in | Wt 195.0 lb

## 2017-10-13 DIAGNOSIS — R600 Localized edema: Secondary | ICD-10-CM | POA: Diagnosis not present

## 2017-10-13 DIAGNOSIS — M25672 Stiffness of left ankle, not elsewhere classified: Secondary | ICD-10-CM | POA: Diagnosis not present

## 2017-10-13 DIAGNOSIS — R29898 Other symptoms and signs involving the musculoskeletal system: Secondary | ICD-10-CM

## 2017-10-13 DIAGNOSIS — T07XXXA Unspecified multiple injuries, initial encounter: Secondary | ICD-10-CM

## 2017-10-13 NOTE — Patient Instructions (Addendum)
Compression socks now when up on his feet  Physical therapy   REFERRALS TO SPECIALISTS, SPECIAL TESTS (MRI, CT, ULTRASOUNDS)  MAR[facility] or  Anastasiya will help you. ASK CHECK-IN FOR HELP.  Specialist appointment times vary a great deal, based on their schedule / openings. -- Some specialists have very long wait times. (Example. Dermatology)
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
## 2017-10-17 DIAGNOSIS — M25572 Pain in left ankle and joints of left foot: Secondary | ICD-10-CM | POA: Diagnosis not present

## 2017-10-20 ENCOUNTER — Telehealth: Payer: Self-pay | Admitting: Family Medicine

## 2017-10-20 NOTE — Telephone Encounter (Signed)
Pt dropped off job description.  In last letter you cleared him to work 6 hours a week through 11/03/17.  They will not let him go back to work until he gets a letter clearing him to do job duties.  He dropped off list of job duties. Put list in dr copland's in box

## 2017-10-22 ENCOUNTER — Encounter: Payer: Self-pay | Admitting: Family Medicine

## 2017-10-22 DIAGNOSIS — M25572 Pain in left ankle and joints of left foot: Secondary | ICD-10-CM | POA: Diagnosis not present

## 2017-10-22 NOTE — Progress Notes (Signed)
done

## 2017-10-22 NOTE — Telephone Encounter (Signed)
This was done.

## 2017-10-23 NOTE — Telephone Encounter (Signed)
Pt aware.

## 2017-10-24 DIAGNOSIS — M25572 Pain in left ankle and joints of left foot: Secondary | ICD-10-CM | POA: Diagnosis not present

## 2017-10-29 DIAGNOSIS — M25572 Pain in left ankle and joints of left foot: Secondary | ICD-10-CM | POA: Diagnosis not present

## 2017-10-31 DIAGNOSIS — M25572 Pain in left ankle and joints of left foot: Secondary | ICD-10-CM | POA: Diagnosis not present

## 2017-11-05 DIAGNOSIS — M25572 Pain in left ankle and joints of left foot: Secondary | ICD-10-CM | POA: Diagnosis not present

## 2017-11-07 DIAGNOSIS — M25572 Pain in left ankle and joints of left foot: Secondary | ICD-10-CM | POA: Diagnosis not present

## 2017-11-10 DIAGNOSIS — M25572 Pain in left ankle and joints of left foot: Secondary | ICD-10-CM | POA: Diagnosis not present

## 2017-11-24 ENCOUNTER — Encounter: Payer: Self-pay | Admitting: Family Medicine

## 2017-11-24 ENCOUNTER — Ambulatory Visit (INDEPENDENT_AMBULATORY_CARE_PROVIDER_SITE_OTHER): Payer: PPO | Admitting: Family Medicine

## 2017-11-24 VITALS — BP 110/64 | HR 75 | Temp 98.5°F | Ht 69.0 in | Wt 201.5 lb

## 2017-11-24 DIAGNOSIS — M25672 Stiffness of left ankle, not elsewhere classified: Secondary | ICD-10-CM

## 2017-11-24 DIAGNOSIS — R600 Localized edema: Secondary | ICD-10-CM

## 2017-11-24 DIAGNOSIS — Z23 Encounter for immunization: Secondary | ICD-10-CM

## 2017-11-24 DIAGNOSIS — T07XXXA Unspecified multiple injuries, initial encounter: Secondary | ICD-10-CM
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
## 2018-03-04 ENCOUNTER — Other Ambulatory Visit: Payer: Self-pay | Admitting: Family Medicine

## 2018-03-04 DIAGNOSIS — Z125 Encounter for screening for malignant neoplasm of prostate: Secondary | ICD-10-CM

## 2018-03-04 DIAGNOSIS — E785 Hyperlipidemia, unspecified: Secondary | ICD-10-CM

## 2018-03-04 DIAGNOSIS — R739 Hyperglycemia, unspecified: Secondary | ICD-10-CM

## 2018-03-04 DIAGNOSIS — Z79899 Other long term (current) drug therapy: Secondary | ICD-10-CM

## 2018-03-09 ENCOUNTER — Other Ambulatory Visit (INDEPENDENT_AMBULATORY_CARE_PROVIDER_SITE_OTHER): Payer: PPO

## 2018-03-09 DIAGNOSIS — E785 Hyperlipidemia, unspecified: Secondary | ICD-10-CM

## 2018-03-09 DIAGNOSIS — Z125 Encounter for screening for malignant neoplasm of prostate: Secondary | ICD-10-CM | POA: Diagnosis not present

## 2018-03-09 DIAGNOSIS — Z79899 Other long term (current) drug therapy: Secondary | ICD-10-CM | POA: Diagnosis not present

## 2018-03-09 DIAGNOSIS — R739 Hyperglycemia, unspecified: Secondary | ICD-10-CM | POA: Diagnosis not present

## 2018-03-09 LAB — LIPID PANEL
Cholesterol: 185 mg/dL (ref 0–200)
HDL: 75.9 mg/dL (ref >39.00)
LDL Cholesterol: 89 mg/dL (ref 0–99)
NonHDL: 109.55
Total CHOL/HDL Ratio: 2
Triglycerides: 105 mg/dL (ref 0.0–149.0)
VLDL: 21 mg/dL (ref 0.0–40.0)

## 2018-03-09 LAB — CBC WITH DIFFERENTIAL/PLATELET
Basophils Absolute: 0.1 10*3/uL (ref 0.0–0.1)
Basophils Relative: 1.2 % (ref 0.0–3.0)
Eosinophils Absolute: 0.5 10*3/uL (ref 0.0–0.7)
Eosinophils Relative: 5.4 % — ABNORMAL HIGH (ref 0.0–5.0)
HCT: 41.4 % (ref 39.0–52.0)
Hemoglobin: 13.9 g/dL (ref 13.0–17.0)
LYMPHS ABS: 2.9 10*3/uL (ref 0.7–4.0)
Lymphocytes Relative: 29.7 % (ref 12.0–46.0)
MCHC: 33.6 g/dL (ref 30.0–36.0)
MCV: 93.7 fl (ref 78.0–100.0)
Monocytes Absolute: 0.7 10*3/uL (ref 0.1–1.0)
Monocytes Relative: 6.9 % (ref 3.0–12.0)
Neutro Abs: 5.5 10*3/uL (ref 1.4–7.7)
Neutrophils Relative %: 56.8 % (ref 43.0–77.0)
Platelets: 319 10*3/uL (ref 150.0–400.0)
RBC: 4.42 Mil/uL (ref 4.22–5.81)
RDW: 12.6 % (ref 11.5–15.5)
WBC: 9.6 10*3/uL (ref 4.0–10.5)

## 2018-03-09 LAB — HEPATIC FUNCTION PANEL
ALT: 21 U/L (ref 0–53)
AST: 21 U/L (ref 0–37)
Albumin: 4 g/dL (ref 3.5–5.2)
Alkaline Phosphatase: 68 U/L (ref 39–117)
BILIRUBIN TOTAL: 0.6 mg/dL (ref 0.2–1.2)
Bilirubin, Direct: 0.1 mg/dL (ref 0.0–0.3)
Total Protein: 7.5 g/dL (ref 6.0–8.3)

## 2018-03-09 LAB — PSA: PSA: 0.63 ng/mL (ref 0.10–4.00)

## 2018-03-09 LAB — HEMOGLOBIN A1C: Hgb A1c MFr Bld: 5.6 % (ref 4.6–6.5)

## 2018-03-09 LAB — BASIC METABOLIC PANEL
BUN: 12 mg/dL (ref 6–23)
CO2: 27 mEq/L (ref 19–32)
Calcium: 9.3 mg/dL (ref 8.4–10.5)
Chloride: 102 mEq/L (ref 96–112)
Creatinine, Ser: 0.86 mg/dL (ref 0.40–1.50)
GFR: 93.08 mL/min (ref >60.00)
Glucose, Bld: 98 mg/dL (ref 70–99)
Potassium: 4.5 mEq/L (ref 3.5–5.1)
Sodium: 136 mEq/L (ref 135–145)

## 2018-03-15 ENCOUNTER — Encounter: Payer: Self-pay | Admitting: Family Medicine
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
## 2018-03-16 ENCOUNTER — Encounter: Payer: Self-pay | Admitting: Family Medicine

## 2018-03-16 ENCOUNTER — Ambulatory Visit (INDEPENDENT_AMBULATORY_CARE_PROVIDER_SITE_OTHER): Payer: PPO | Admitting: Family Medicine

## 2018-03-16 VITALS — BP 130/80 | HR 81 | Temp 98.4°F | Ht 69.0 in | Wt 203.8 lb

## 2018-03-16 DIAGNOSIS — Z0000 Encounter for general adult medical examination without abnormal findings: Secondary | ICD-10-CM

## 2018-03-16 MED ORDER — HYDROCORTISONE 2.5 % EX CREA: TOPICAL_CREAM | Freq: Two times a day (BID) | CUTANEOUS | 3 refills | Status: DC

## 2018-03-16 MED ORDER — GABAPENTIN 100 MG PO CAPS: ORAL_CAPSULE | ORAL | 2 refills | Status: DC

## 2018-07-28 DIAGNOSIS — L821 Other seborrheic keratosis: Secondary | ICD-10-CM | POA: Diagnosis not present

## 2018-07-28 DIAGNOSIS — C44519 Basal cell carcinoma of skin of other part of trunk: Secondary | ICD-10-CM | POA: Diagnosis not present

## 2018-07-28 DIAGNOSIS — L570 Actinic keratosis: Secondary | ICD-10-CM | POA: Diagnosis not present

## 2018-07-28 DIAGNOSIS — D1801 Hemangioma of skin and subcutaneous tissue: Secondary | ICD-10-CM | POA: Diagnosis not present

## 2018-07-28 DIAGNOSIS — D2261 Melanocytic nevi of right upper limb, including shoulder: Secondary | ICD-10-CM | POA: Diagnosis not present

## 2018-07-28 DIAGNOSIS — L820 Inflamed seborrheic keratosis: Secondary | ICD-10-CM | POA: Diagnosis not present

## 2018-07-28 DIAGNOSIS — Z85828 Personal history of other malignant neoplasm of skin: Secondary | ICD-10-CM | POA: Diagnosis not present

## 2018-07-28 DIAGNOSIS — L218 Other seborrheic dermatitis: Secondary | ICD-10-CM | POA: Diagnosis not present

## 2018-07-28 DIAGNOSIS — C44719 Basal cell carcinoma of skin of left lower limb, including hip: Secondary | ICD-10-CM | POA: Diagnosis not present

## 2018-07-28 DIAGNOSIS — D485 Neoplasm of uncertain behavior of skin: Secondary | ICD-10-CM | POA: Diagnosis not present

## 2018-07-28 DIAGNOSIS — L812 Freckles: Secondary | ICD-10-CM | POA: Diagnosis not present

## 2018-10-27 ENCOUNTER — Ambulatory Visit (INDEPENDENT_AMBULATORY_CARE_PROVIDER_SITE_OTHER): Payer: PPO

## 2018-10-27 DIAGNOSIS — Z23 Encounter for immunization: Secondary | ICD-10-CM | POA: Diagnosis not present

## 2019-02-01 DIAGNOSIS — L570 Actinic keratosis: Secondary | ICD-10-CM | POA: Diagnosis not present

## 2019-02-01 DIAGNOSIS — L812 Freckles: Secondary | ICD-10-CM | POA: Diagnosis not present

## 2019-02-01 DIAGNOSIS — Z85828 Personal history of other malignant neoplasm of skin: Secondary | ICD-10-CM | POA: Diagnosis not present

## 2019-02-01 DIAGNOSIS — L821 Other seborrheic keratosis: Secondary | ICD-10-CM | POA: Diagnosis not present

## 2019-02-01 DIAGNOSIS — I788 Other diseases of capillaries: Secondary | ICD-10-CM | POA: Diagnosis not present

## 2019-03-19 ENCOUNTER — Other Ambulatory Visit: Payer: Self-pay | Admitting: Family Medicine

## 2019-03-19 DIAGNOSIS — Z125 Encounter for screening for malignant neoplasm of prostate: Secondary | ICD-10-CM

## 2019-03-19 DIAGNOSIS — Z79899 Other long term (current) drug therapy: Secondary | ICD-10-CM

## 2019-03-19 DIAGNOSIS — E785 Hyperlipidemia, unspecified: Secondary | ICD-10-CM

## 2019-03-22 ENCOUNTER — Other Ambulatory Visit (INDEPENDENT_AMBULATORY_CARE_PROVIDER_SITE_OTHER): Payer: PPO

## 2019-03-22 ENCOUNTER — Other Ambulatory Visit: Payer: Self-pay

## 2019-03-22 DIAGNOSIS — Z79899 Other long term (current) drug therapy: Secondary | ICD-10-CM | POA: Diagnosis not present

## 2019-03-22 DIAGNOSIS — Z125 Encounter for screening for malignant neoplasm of prostate: Secondary | ICD-10-CM | POA: Diagnosis not present

## 2019-03-22 DIAGNOSIS — E785 Hyperlipidemia, unspecified: Secondary | ICD-10-CM | POA: Diagnosis not present

## 2019-03-22 LAB — BASIC METABOLIC PANEL
BUN: 17 mg/dL (ref 6–23)
CO2: 27 mEq/L (ref 19–32)
Calcium: 8.9 mg/dL (ref 8.4–10.5)
Chloride: 104 mEq/L (ref 96–112)
Creatinine, Ser: 0.8 mg/dL (ref 0.40–1.50)
GFR: 94.92 mL/min (ref >60.00)
Glucose, Bld: 101 mg/dL — ABNORMAL HIGH (ref 70–99)
Potassium: 4.4 mEq/L (ref 3.5–5.1)
Sodium: 137 mEq/L (ref 135–145)

## 2019-03-22 LAB — CBC WITH DIFFERENTIAL/PLATELET
Basophils Absolute: 0.1 10*3/uL (ref 0.0–0.1)
Basophils Relative: 1 % (ref 0.0–3.0)
Eosinophils Absolute: 0.5 10*3/uL (ref 0.0–0.7)
Eosinophils Relative: 4.6 % (ref 0.0–5.0)
HCT: 41.4 % (ref 39.0–52.0)
Hemoglobin: 14 g/dL (ref 13.0–17.0)
Lymphocytes Relative: 28 % (ref 12.0–46.0)
Lymphs Abs: 2.9 10*3/uL (ref 0.7–4.0)
MCHC: 33.9 g/dL (ref 30.0–36.0)
MCV: 93.8 fl (ref 78.0–100.0)
Monocytes Absolute: 0.9 10*3/uL (ref 0.1–1.0)
Monocytes Relative: 9 % (ref 3.0–12.0)
Neutro Abs: 6 10*3/uL (ref 1.4–7.7)
Neutrophils Relative %: 57.4 % (ref 43.0–77.0)
Platelets: 287 10*3/uL (ref 150.0–400.0)
RBC: 4.41 Mil/uL (ref 4.22–5.81)
RDW: 12.9 % (ref 11.5–15.5)
WBC: 10.5 10*3/uL (ref 4.0–10.5)

## 2019-03-22 LAB — LIPID PANEL
Cholesterol: 162 mg/dL (ref 0–200)
HDL: 70.5 mg/dL (ref >39.00)
LDL Cholesterol: 80 mg/dL (ref 0–99)
NonHDL: 91.15
Total CHOL/HDL Ratio: 2
Triglycerides: 58 mg/dL (ref 0.0–149.0)
VLDL: 11.6 mg/dL (ref 0.0–40.0)

## 2019-03-22 LAB — HEPATIC FUNCTION PANEL
ALT: 22 U/L (ref 0–53)
AST: 32 U/L (ref 0–37)
Albumin: 3.9 g/dL (ref 3.5–5.2)
Alkaline Phosphatase: 67 U/L (ref 39–117)
Bilirubin, Direct: 0.2 mg/dL (ref 0.0–0.3)
Total Bilirubin: 0.7 mg/dL (ref 0.2–1.2)
Total Protein: 7.3 g/dL (ref 6.0–8.3)

## 2019-03-23 LAB — PSA, TOTAL WITH REFLEX TO PSA, FREE: PSA, Total: 0.7 ng/mL (ref <=4.0)

## 2019-03-27 ENCOUNTER — Ambulatory Visit: Payer: PPO

## 2019-03-29 ENCOUNTER — Encounter: Payer: Self-pay | Admitting: Family Medicine

## 2019-03-29 ENCOUNTER — Ambulatory Visit (INDEPENDENT_AMBULATORY_CARE_PROVIDER_SITE_OTHER): Payer: PPO | Admitting: Family Medicine

## 2019-03-29 ENCOUNTER — Other Ambulatory Visit: Payer: Self-pay

## 2019-03-29 VITALS — BP 132/74 | HR 72 | Temp 98.1°F | Ht 68.5 in | Wt 203.5 lb

## 2019-03-29 DIAGNOSIS — Z0000 Encounter for general adult medical examination without abnormal findings: Secondary | ICD-10-CM
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
## 2019-04-01 ENCOUNTER — Ambulatory Visit: Payer: PPO | Attending: Internal Medicine

## 2019-04-01 DIAGNOSIS — Z23 Encounter for immunization: Secondary | ICD-10-CM | POA: Insufficient documentation

## 2019-04-01 NOTE — Progress Notes (Signed)
   Covid-19 Vaccination Clinic  Name:  KAEO MCNAIR    MRN: [id number] DOB: 04-14-1945  04/01/2019  Mr. Gostomski was observed post Covid-19 immunization for 15 minutes without incidence. He was provided with Vaccine Information Sheet and instruction to access the V-Safe system.   Mr. Kina was instructed to call 911 with any severe reactions post vaccine: Marland Kitchen Difficulty breathing  . Swelling of your face and throat  . A fast heartbeat  . A bad rash all over your body  . Dizziness and weakness    Immunizations Administered    Name Date Dose VIS Date Route   Pfizer COVID-19 Vaccine 04/01/2019  8:29 AM 0.3 mL 02/05/2019 Intramuscular   Manufacturer: [place]   Lot: [id number]   [place]: [id number]

## 2019-04-07 ENCOUNTER — Ambulatory Visit: Payer: PPO

## 2019-04-26 ENCOUNTER — Ambulatory Visit: Payer: PPO | Attending: Internal Medicine

## 2019-04-26 DIAGNOSIS — Z23 Encounter for immunization: Secondary | ICD-10-CM

## 2019-04-26 NOTE — Progress Notes (Signed)
   Covid-19 Vaccination Clinic  Name:  Kyle [name]    MRN: [id number] DOB: September 13, 1945  04/26/2019  Mr. Beichler was observed post Covid-19 immunization for 15 minutes without incidence. He was provided with Vaccine Information Sheet and instruction to access the V-Safe system.   Mr. Wirtanen was instructed to call 911 with any severe reactions post vaccine: Marland Kitchen Difficulty breathing  . Swelling of your face and throat  . A fast heartbeat  . A bad rash all over your body  . Dizziness and weakness    Immunizations Administered    Name Date Dose VIS Date Route   Pfizer COVID-19 Vaccine 04/26/2019  8:34 AM 0.3 mL 02/05/2019 Intramuscular   Manufacturer: [place]   Lot: [id number]   [place]: [id number]

## 2019-04-30 ENCOUNTER — Telehealth: Payer: Self-pay
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
## 2019-05-02 MED ORDER — MELOXICAM 15 MG PO TABS: 15.0000 mg | ORAL_TABLET | Freq: Every day | ORAL | 1 refills | Status: DC

## 2019-05-02 NOTE — Telephone Encounter (Signed)
Done.  I sent him in #[phone].  Ideally, I would not like for him to take it every day.

## 2019-05-03 NOTE — Telephone Encounter (Signed)
Kyle [name] notified as instructed by telephone.  Patient states understanding.  ?

## 2019-05-26 ENCOUNTER — Encounter: Payer: Self-pay | Admitting: Family Medicine

## 2019-05-28 DIAGNOSIS — M9905 Segmental and somatic dysfunction of pelvic region: Secondary | ICD-10-CM | POA: Diagnosis not present

## 2019-05-28 DIAGNOSIS — M5441 Lumbago with sciatica, right side: Secondary | ICD-10-CM | POA: Diagnosis not present

## 2019-05-28 DIAGNOSIS — M9903 Segmental and somatic dysfunction of lumbar region: Secondary | ICD-10-CM | POA: Diagnosis not present

## 2019-05-28 DIAGNOSIS — M9902 Segmental and somatic dysfunction of thoracic region: Secondary | ICD-10-CM | POA: Diagnosis not present

## 2019-06-03 DIAGNOSIS — M9902 Segmental and somatic dysfunction of thoracic region: Secondary | ICD-10-CM | POA: Diagnosis not present

## 2019-06-03 DIAGNOSIS — M5441 Lumbago with sciatica, right side: Secondary | ICD-10-CM | POA: Diagnosis not present

## 2019-06-03 DIAGNOSIS — M9903 Segmental and somatic dysfunction of lumbar region: Secondary | ICD-10-CM | POA: Diagnosis not present

## 2019-06-03 DIAGNOSIS — M9905 Segmental and somatic dysfunction of pelvic region: Secondary | ICD-10-CM | POA: Diagnosis not present

## 2019-06-11 DIAGNOSIS — M9905 Segmental and somatic dysfunction of pelvic region: Secondary | ICD-10-CM | POA: Diagnosis not present

## 2019-06-11 DIAGNOSIS — M5441 Lumbago with sciatica, right side: Secondary | ICD-10-CM | POA: Diagnosis not present

## 2019-06-11 DIAGNOSIS — M9902 Segmental and somatic dysfunction of thoracic region: Secondary | ICD-10-CM | POA: Diagnosis not present

## 2019-06-11 DIAGNOSIS — M9903 Segmental and somatic dysfunction of lumbar region: Secondary | ICD-10-CM | POA: Diagnosis not present

## 2019-06-17 DIAGNOSIS — M9903 Segmental and somatic dysfunction of lumbar region: Secondary | ICD-10-CM | POA: Diagnosis not present

## 2019-06-17 DIAGNOSIS — M9905 Segmental and somatic dysfunction of pelvic region: Secondary | ICD-10-CM | POA: Diagnosis not present

## 2019-06-17 DIAGNOSIS — M5441 Lumbago with sciatica, right side: Secondary | ICD-10-CM | POA: Diagnosis not present

## 2019-06-17 DIAGNOSIS — M9902 Segmental and somatic dysfunction of thoracic region: Secondary | ICD-10-CM | POA: Diagnosis not present

## 2019-06-24 DIAGNOSIS — M9902 Segmental and somatic dysfunction of thoracic region: Secondary | ICD-10-CM | POA: Diagnosis not present

## 2019-06-24 DIAGNOSIS — M9905 Segmental and somatic dysfunction of pelvic region: Secondary | ICD-10-CM | POA: Diagnosis not present

## 2019-06-24 DIAGNOSIS — M9903 Segmental and somatic dysfunction of lumbar region: Secondary | ICD-10-CM | POA: Diagnosis not present

## 2019-06-24 DIAGNOSIS — M5441 Lumbago with sciatica, right side: Secondary | ICD-10-CM | POA: Diagnosis not present

## 2019-07-06 DIAGNOSIS — M25672 Stiffness of left ankle, not elsewhere classified: Secondary | ICD-10-CM | POA: Diagnosis not present

## 2019-07-06 DIAGNOSIS — M25572 Pain in left ankle and joints of left foot: Secondary | ICD-10-CM | POA: Diagnosis not present

## 2019-07-06 DIAGNOSIS — M25571 Pain in right ankle and joints of right foot: Secondary | ICD-10-CM | POA: Diagnosis not present

## 2019-07-08 DIAGNOSIS — M25571 Pain in right ankle and joints of right foot: Secondary | ICD-10-CM | POA: Diagnosis not present

## 2019-07-08 DIAGNOSIS — M25672 Stiffness of left ankle, not elsewhere classified: Secondary | ICD-10-CM | POA: Diagnosis not present

## 2019-07-08 DIAGNOSIS — M25572 Pain in left ankle and joints of left foot: Secondary | ICD-10-CM | POA: Diagnosis not present

## 2019-07-13 DIAGNOSIS — M25571 Pain in right ankle and joints of right foot: Secondary | ICD-10-CM | POA: Diagnosis not present

## 2019-07-13 DIAGNOSIS — M25572 Pain in left ankle and joints of left foot: Secondary | ICD-10-CM | POA: Diagnosis not present

## 2019-07-13 DIAGNOSIS — M25672 Stiffness of left ankle, not elsewhere classified: Secondary | ICD-10-CM | POA: Diagnosis not present

## 2019-07-20 DIAGNOSIS — M25572 Pain in left ankle and joints of left foot: Secondary | ICD-10-CM | POA: Diagnosis not present

## 2019-07-20 DIAGNOSIS — M25672 Stiffness of left ankle, not elsewhere classified: Secondary | ICD-10-CM | POA: Diagnosis not present

## 2019-07-20 DIAGNOSIS — M25571 Pain in right ankle and joints of right foot: Secondary | ICD-10-CM | POA: Diagnosis not present

## 2019-07-27 DIAGNOSIS — M25571 Pain in right ankle and joints of right foot: Secondary | ICD-10-CM | POA: Diagnosis not present

## 2019-07-27 DIAGNOSIS — L570 Actinic keratosis: Secondary | ICD-10-CM | POA: Diagnosis not present

## 2019-07-27 DIAGNOSIS — L820 Inflamed seborrheic keratosis: Secondary | ICD-10-CM | POA: Diagnosis not present

## 2019-07-27 DIAGNOSIS — Z85828 Personal history of other malignant neoplasm of skin: Secondary | ICD-10-CM | POA: Diagnosis not present

## 2019-07-27 DIAGNOSIS — M25672 Stiffness of left ankle, not elsewhere classified: Secondary | ICD-10-CM | POA: Diagnosis not present

## 2019-07-27 DIAGNOSIS — L918 Other hypertrophic disorders of the skin: Secondary | ICD-10-CM | POA: Diagnosis not present

## 2019-07-27 DIAGNOSIS — D1801 Hemangioma of skin and subcutaneous tissue: Secondary | ICD-10-CM | POA: Diagnosis not present

## 2019-07-27 DIAGNOSIS — L821 Other seborrheic keratosis: Secondary | ICD-10-CM | POA: Diagnosis not present

## 2019-07-27 DIAGNOSIS — L812 Freckles: Secondary | ICD-10-CM | POA: Diagnosis not present

## 2019-07-27 DIAGNOSIS — M25572 Pain in left ankle and joints of left foot: Secondary | ICD-10-CM | POA: Diagnosis not present

## 2019-07-29 ENCOUNTER — Telehealth: Payer: Self-pay | Admitting: Family Medicine

## 2019-07-29 NOTE — Telephone Encounter (Signed)
Non urgent

## 2019-07-29 NOTE — Telephone Encounter (Signed)
Offered patient an appt today at 2pm with Dr Lorelei Pont, pt is about to head into work so is not able to come in today. Requested tomorrow but Dr Lorelei Pont is not in office. Pt has been scheduled for Monday 08/02/19 at 2pm  Nothing further needed.

## 2019-07-29 NOTE — Telephone Encounter (Signed)
Pt calling with c/o increased hamstring pain.  Pt states that medication and PT is no longer working 100% and the pain is becoming more severe.  Pt is beginning to wonder if he has circulation issues in his legs.  His brother had similar issues and ended up having a doppler trace done to check the circulation in his legs and the results showed that he did have decreased circulation and ended up having surgery to correct this.   Is this something that can be ordered? Just to double check things.   Please advise, thanks.

## 2019-07-29 NOTE — Telephone Encounter (Signed)
Have him follow-up with me in the office.  Evaluation and exam needed
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
## 2019-08-02 ENCOUNTER — Ambulatory Visit (INDEPENDENT_AMBULATORY_CARE_PROVIDER_SITE_OTHER): Payer: PPO | Admitting: Family Medicine

## 2019-08-02 ENCOUNTER — Encounter: Payer: Self-pay | Admitting: Family Medicine

## 2019-08-02 ENCOUNTER — Other Ambulatory Visit: Payer: Self-pay

## 2019-08-02 VITALS — BP 120/64 | HR 81 | Temp 98.6°F | Ht 68.5 in | Wt 204.5 lb

## 2019-08-02 DIAGNOSIS — M79604 Pain in right leg: Secondary | ICD-10-CM | POA: Diagnosis not present

## 2019-08-02 DIAGNOSIS — M549 Dorsalgia, unspecified: Secondary | ICD-10-CM | POA: Diagnosis not present

## 2019-08-02 MED ORDER — PREDNISONE 20 MG PO TABS: ORAL_TABLET | ORAL | 0 refills | Status: DC

## 2019-08-02 NOTE — Patient Instructions (Signed)
Hamstring Rehab 2)  HS curls with weight - begin with 2 lb ankle weight when above is easy and start with 3 sets of 10; increasing every 5 days by 5 reps - eg to 3 sets of 15 reps 3)  HS swings - swing leg backwards and curl at the end of the swing; follow same schedule as above. 4)  HS running lunges - running lunge position means no more than 45 degrees of knee flexion and running motion.  follow same schedule as above.   Hamstring walking program: comfortable pace 2.8-4.0 mph, start 0, increase every 2 mins by 3 degrees, 0-3-08-04-10-15-12-9-6-3-0, repeat until 30 mins complete. It is OK to do bicycle riding, elliptical, etc. Hold off on running until OK'd by MD.  Try to do this workout 3 times a week or more in addition to above.   Start at 15 minutes and work up

## 2019-11-06 ENCOUNTER — Ambulatory Visit: Payer: PPO

## 2019-11-10 ENCOUNTER — Ambulatory Visit (INDEPENDENT_AMBULATORY_CARE_PROVIDER_SITE_OTHER): Payer: PPO

## 2019-11-10 ENCOUNTER — Other Ambulatory Visit: Payer: Self-pay

## 2019-11-10 DIAGNOSIS — Z23 Encounter for immunization: Secondary | ICD-10-CM

## 2020-01-13 DIAGNOSIS — M461 Sacroiliitis, not elsewhere classified: Secondary | ICD-10-CM | POA: Diagnosis not present

## 2020-01-13 DIAGNOSIS — M5441 Lumbago with sciatica, right side: Secondary | ICD-10-CM | POA: Diagnosis not present

## 2020-01-13 DIAGNOSIS — M9903 Segmental and somatic dysfunction of lumbar region: Secondary | ICD-10-CM | POA: Diagnosis not present

## 2020-01-13 DIAGNOSIS — M9904 Segmental and somatic dysfunction of sacral region: Secondary | ICD-10-CM | POA: Diagnosis not present

## 2020-01-13 DIAGNOSIS — M5413 Radiculopathy, cervicothoracic region: Secondary | ICD-10-CM | POA: Diagnosis not present

## 2020-01-13 DIAGNOSIS — M9901 Segmental and somatic dysfunction of cervical region: Secondary | ICD-10-CM | POA: Diagnosis not present

## 2020-01-17 DIAGNOSIS — M461 Sacroiliitis, not elsewhere classified: Secondary | ICD-10-CM | POA: Diagnosis not present

## 2020-01-17 DIAGNOSIS — M5441 Lumbago with sciatica, right side: Secondary | ICD-10-CM | POA: Diagnosis not present

## 2020-01-17 DIAGNOSIS — M5413 Radiculopathy, cervicothoracic region: Secondary | ICD-10-CM | POA: Diagnosis not present

## 2020-01-17 DIAGNOSIS — M9904 Segmental and somatic dysfunction of sacral region: Secondary | ICD-10-CM | POA: Diagnosis not present

## 2020-01-17 DIAGNOSIS — M9903 Segmental and somatic dysfunction of lumbar region: Secondary | ICD-10-CM | POA: Diagnosis not present

## 2020-01-17 DIAGNOSIS — M9901 Segmental and somatic dysfunction of cervical region: Secondary | ICD-10-CM | POA: Diagnosis not present

## 2020-01-24 DIAGNOSIS — M9903 Segmental and somatic dysfunction of lumbar region: Secondary | ICD-10-CM | POA: Diagnosis not present

## 2020-01-24 DIAGNOSIS — M5441 Lumbago with sciatica, right side: Secondary | ICD-10-CM | POA: Diagnosis not present

## 2020-01-24 DIAGNOSIS — M9901 Segmental and somatic dysfunction of cervical region: Secondary | ICD-10-CM | POA: Diagnosis not present

## 2020-01-24 DIAGNOSIS — M5413 Radiculopathy, cervicothoracic region: Secondary | ICD-10-CM | POA: Diagnosis not present

## 2020-01-24 DIAGNOSIS — M461 Sacroiliitis, not elsewhere classified: Secondary | ICD-10-CM | POA: Diagnosis not present

## 2020-01-24 DIAGNOSIS — M9904 Segmental and somatic dysfunction of sacral region: Secondary | ICD-10-CM | POA: Diagnosis not present

## 2020-01-27 DIAGNOSIS — M9904 Segmental and somatic dysfunction of sacral region: Secondary | ICD-10-CM | POA: Diagnosis not present

## 2020-01-27 DIAGNOSIS — M9901 Segmental and somatic dysfunction of cervical region: Secondary | ICD-10-CM | POA: Diagnosis not present

## 2020-01-27 DIAGNOSIS — M5413 Radiculopathy, cervicothoracic region: Secondary | ICD-10-CM | POA: Diagnosis not present

## 2020-01-27 DIAGNOSIS — M461 Sacroiliitis, not elsewhere classified: Secondary | ICD-10-CM | POA: Diagnosis not present

## 2020-01-27 DIAGNOSIS — M9903 Segmental and somatic dysfunction of lumbar region: Secondary | ICD-10-CM | POA: Diagnosis not present

## 2020-01-27 DIAGNOSIS — M5441 Lumbago with sciatica, right side: Secondary | ICD-10-CM | POA: Diagnosis not present

## 2020-01-31 DIAGNOSIS — M9904 Segmental and somatic dysfunction of sacral region: Secondary | ICD-10-CM | POA: Diagnosis not present

## 2020-01-31 DIAGNOSIS — M5441 Lumbago with sciatica, right side: Secondary | ICD-10-CM | POA: Diagnosis not present

## 2020-01-31 DIAGNOSIS — M9901 Segmental and somatic dysfunction of cervical region: Secondary | ICD-10-CM | POA: Diagnosis not present

## 2020-01-31 DIAGNOSIS — L821 Other seborrheic keratosis: Secondary | ICD-10-CM | POA: Diagnosis not present

## 2020-01-31 DIAGNOSIS — M5413 Radiculopathy, cervicothoracic region: Secondary | ICD-10-CM | POA: Diagnosis not present

## 2020-01-31 DIAGNOSIS — D2271 Melanocytic nevi of right lower limb, including hip: Secondary | ICD-10-CM | POA: Diagnosis not present

## 2020-01-31 DIAGNOSIS — M9903 Segmental and somatic dysfunction of lumbar region: Secondary | ICD-10-CM | POA: Diagnosis not present

## 2020-01-31 DIAGNOSIS — M461 Sacroiliitis, not elsewhere classified: Secondary | ICD-10-CM | POA: Diagnosis not present

## 2020-01-31 DIAGNOSIS — Z85828 Personal history of other malignant neoplasm of skin: Secondary | ICD-10-CM | POA: Diagnosis not present

## 2020-01-31 DIAGNOSIS — L570 Actinic keratosis: Secondary | ICD-10-CM | POA: Diagnosis not present

## 2020-02-03 DIAGNOSIS — Z96652 Presence of left artificial knee joint: Secondary | ICD-10-CM | POA: Diagnosis not present

## 2020-02-03 DIAGNOSIS — M461 Sacroiliitis, not elsewhere classified: Secondary | ICD-10-CM | POA: Diagnosis not present

## 2020-02-03 DIAGNOSIS — M9903 Segmental and somatic dysfunction of lumbar region: Secondary | ICD-10-CM | POA: Diagnosis not present

## 2020-02-03 DIAGNOSIS — M9904 Segmental and somatic dysfunction of sacral region: Secondary | ICD-10-CM | POA: Diagnosis not present

## 2020-02-03 DIAGNOSIS — M1711 Unilateral primary osteoarthritis, right knee: Secondary | ICD-10-CM | POA: Diagnosis not present

## 2020-02-03 DIAGNOSIS — M2578 Osteophyte, vertebrae: Secondary | ICD-10-CM | POA: Diagnosis not present

## 2020-02-03 DIAGNOSIS — M5441 Lumbago with sciatica, right side: Secondary | ICD-10-CM | POA: Diagnosis not present

## 2020-02-03 DIAGNOSIS — M5126 Other intervertebral disc displacement, lumbar region: Secondary | ICD-10-CM | POA: Diagnosis not present

## 2020-02-03 DIAGNOSIS — M9901 Segmental and somatic dysfunction of cervical region: Secondary | ICD-10-CM | POA: Diagnosis not present

## 2020-02-03 DIAGNOSIS — M5442 Lumbago with sciatica, left side: Secondary | ICD-10-CM | POA: Diagnosis not present

## 2020-02-03 DIAGNOSIS — M5413 Radiculopathy, cervicothoracic region: Secondary | ICD-10-CM | POA: Diagnosis not present

## 2020-02-03 DIAGNOSIS — M5416 Radiculopathy, lumbar region: Secondary | ICD-10-CM | POA: Diagnosis not present

## 2020-02-07 ENCOUNTER — Ambulatory Visit (INDEPENDENT_AMBULATORY_CARE_PROVIDER_SITE_OTHER): Payer: PPO | Admitting: Family Medicine

## 2020-02-07 ENCOUNTER — Encounter: Payer: Self-pay | Admitting: Family Medicine

## 2020-02-07 ENCOUNTER — Other Ambulatory Visit: Payer: Self-pay

## 2020-02-07 VITALS — BP 130/80 | HR 78 | Temp 97.3°F | Ht 68.5 in | Wt 191.0 lb

## 2020-02-07 DIAGNOSIS — M9901 Segmental and somatic dysfunction of cervical region: Secondary | ICD-10-CM | POA: Diagnosis not present

## 2020-02-07 DIAGNOSIS — M1711 Unilateral primary osteoarthritis, right knee: Secondary | ICD-10-CM

## 2020-02-07 DIAGNOSIS — M5413 Radiculopathy, cervicothoracic region: Secondary | ICD-10-CM | POA: Diagnosis not present

## 2020-02-07 DIAGNOSIS — M461 Sacroiliitis, not elsewhere classified: Secondary | ICD-10-CM | POA: Diagnosis not present

## 2020-02-07 DIAGNOSIS — M9903 Segmental and somatic dysfunction of lumbar region: Secondary | ICD-10-CM | POA: Diagnosis not present

## 2020-02-07 DIAGNOSIS — M5441 Lumbago with sciatica, right side: Secondary | ICD-10-CM | POA: Diagnosis not present

## 2020-02-07 DIAGNOSIS — Z01818 Encounter for other preprocedural examination: Secondary | ICD-10-CM | POA: Diagnosis not present

## 2020-02-07 DIAGNOSIS — M9904 Segmental and somatic dysfunction of sacral region: Secondary | ICD-10-CM | POA: Diagnosis not present
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
## 2020-02-08 ENCOUNTER — Encounter: Payer: Self-pay | Admitting: Family Medicine

## 2020-02-08 LAB — CBC WITH DIFFERENTIAL/PLATELET
Basophils Absolute: 0.1 10*3/uL (ref 0.0–0.1)
Basophils Relative: 0.7 % (ref 0.0–3.0)
Eosinophils Absolute: 0.6 10*3/uL (ref 0.0–0.7)
Eosinophils Relative: 5.1 % — ABNORMAL HIGH (ref 0.0–5.0)
HCT: 41.6 % (ref 39.0–52.0)
Hemoglobin: 13.8 g/dL (ref 13.0–17.0)
Lymphocytes Relative: 27.5 % (ref 12.0–46.0)
Lymphs Abs: 3.3 10*3/uL (ref 0.7–4.0)
MCHC: 33.1 g/dL (ref 30.0–36.0)
MCV: 93.4 fl (ref 78.0–100.0)
Monocytes Absolute: 1 10*3/uL (ref 0.1–1.0)
Monocytes Relative: 8.3 % (ref 3.0–12.0)
Neutro Abs: 6.9 10*3/uL (ref 1.4–7.7)
Neutrophils Relative %: 58.4 % (ref 43.0–77.0)
Platelets: 300 10*3/uL (ref 150.0–400.0)
RBC: 4.46 Mil/uL (ref 4.22–5.81)
RDW: 13.1 % (ref 11.5–15.5)
WBC: 11.9 10*3/uL — ABNORMAL HIGH (ref 4.0–10.5)

## 2020-02-08 LAB — HEPATIC FUNCTION PANEL
ALT: 22 U/L (ref 0–53)
AST: 25 U/L (ref 0–37)
Albumin: 4.2 g/dL (ref 3.5–5.2)
Alkaline Phosphatase: 60 U/L (ref 39–117)
Bilirubin, Direct: 0.1 mg/dL (ref 0.0–0.3)
Total Bilirubin: 0.6 mg/dL (ref 0.2–1.2)
Total Protein: 7.9 g/dL (ref 6.0–8.3)

## 2020-02-08 LAB — BASIC METABOLIC PANEL
BUN: 22 mg/dL (ref 6–23)
CO2: 26 mEq/L (ref 19–32)
Calcium: 9.7 mg/dL (ref 8.4–10.5)
Chloride: 100 mEq/L (ref 96–112)
Creatinine, Ser: 0.92 mg/dL (ref 0.40–1.50)
GFR: 82.66 mL/min (ref >60.00)
Glucose, Bld: 77 mg/dL (ref 70–99)
Potassium: 4.2 mEq/L (ref 3.5–5.1)
Sodium: 135 mEq/L (ref 135–145)

## 2020-02-08 LAB — HEMOGLOBIN A1C: Hgb A1c MFr Bld: 5.6 % (ref 4.6–6.5)

## 2020-02-23 ENCOUNTER — Telehealth: Payer: Self-pay | Admitting: Family Medicine

## 2020-02-23 NOTE — Telephone Encounter (Signed)
Left message for Mr. Naill that paperwork was faxed to Dr. Tobin Chad office on 02/10/2020 and I re-faxed everything again, just now, so hopefully they will get it.

## 2020-02-23 NOTE — Telephone Encounter (Signed)
Pt called in wanted to know about the pre-op and lab results were suppose to be sent to [facility] orthopedic surgeon but they have not received anything.  Fax Number :[phone] att: Loney Loh

## 2020-02-23 NOTE — Telephone Encounter (Signed)
Can you tell Genevie Cheshire that we sent it over 02/09/2020.  Somebody dropped the ball.  Impossible to know if it was our office or his office.

## 2020-03-06 ENCOUNTER — Other Ambulatory Visit: Payer: Self-pay

## 2020-03-06 ENCOUNTER — Telehealth: Payer: Self-pay | Admitting: Family Medicine

## 2020-03-06 ENCOUNTER — Other Ambulatory Visit (INDEPENDENT_AMBULATORY_CARE_PROVIDER_SITE_OTHER): Payer: Medicare Other

## 2020-03-06 DIAGNOSIS — Z79899 Other long term (current) drug therapy: Secondary | ICD-10-CM

## 2020-03-06 DIAGNOSIS — Z13220 Encounter for screening for lipoid disorders: Secondary | ICD-10-CM | POA: Diagnosis not present

## 2020-03-06 DIAGNOSIS — Z125 Encounter for screening for malignant neoplasm of prostate: Secondary | ICD-10-CM

## 2020-03-06 LAB — CBC WITH DIFFERENTIAL/PLATELET
Basophils Absolute: 0.1 10*3/uL (ref 0.0–0.1)
Basophils Relative: 0.9 % (ref 0.0–3.0)
Eosinophils Absolute: 0.4 10*3/uL (ref 0.0–0.7)
Eosinophils Relative: 4.8 % (ref 0.0–5.0)
HCT: 42.2 % (ref 39.0–52.0)
Hemoglobin: 14.3 g/dL (ref 13.0–17.0)
Lymphocytes Relative: 34.2 % (ref 12.0–46.0)
Lymphs Abs: 3.1 10*3/uL (ref 0.7–4.0)
MCHC: 34 g/dL (ref 30.0–36.0)
MCV: 93.6 fl (ref 78.0–100.0)
Monocytes Absolute: 0.7 10*3/uL (ref 0.1–1.0)
Monocytes Relative: 7.5 % (ref 3.0–12.0)
Neutro Abs: 4.8 10*3/uL (ref 1.4–7.7)
Neutrophils Relative %: 52.6 % (ref 43.0–77.0)
Platelets: 296 10*3/uL (ref 150.0–400.0)
RBC: 4.51 Mil/uL (ref 4.22–5.81)
RDW: 13.5 % (ref 11.5–15.5)
WBC: 9.2 10*3/uL (ref 4.0–10.5)

## 2020-03-06 LAB — LIPID PANEL
Cholesterol: 200 mg/dL (ref 0–200)
HDL: 78.7 mg/dL (ref >39.00)
LDL Cholesterol: 109 mg/dL — ABNORMAL HIGH (ref 0–99)
NonHDL: 121.13
Total CHOL/HDL Ratio: 3
Triglycerides: 63 mg/dL (ref 0.0–149.0)
VLDL: 12.6 mg/dL (ref 0.0–40.0)

## 2020-03-06 LAB — HEPATIC FUNCTION PANEL
ALT: 20 U/L (ref 0–53)
AST: 21 U/L (ref 0–37)
Albumin: 4.1 g/dL (ref 3.5–5.2)
Alkaline Phosphatase: 63 U/L (ref 39–117)
Bilirubin, Direct: 0.2 mg/dL (ref 0.0–0.3)
Total Bilirubin: 0.7 mg/dL (ref 0.2–1.2)
Total Protein: 7.3 g/dL (ref 6.0–8.3)

## 2020-03-06 LAB — BASIC METABOLIC PANEL
BUN: 12 mg/dL (ref 6–23)
CO2: 31 mEq/L (ref 19–32)
Calcium: 9.3 mg/dL (ref 8.4–10.5)
Chloride: 101 mEq/L (ref 96–112)
Creatinine, Ser: 0.87 mg/dL (ref 0.40–1.50)
GFR: 85.7 mL/min (ref >60.00)
Glucose, Bld: 94 mg/dL (ref 70–99)
Potassium: 4.4 mEq/L (ref 3.5–5.1)
Sodium: 136 mEq/L (ref 135–145)

## 2020-03-06 LAB — PSA, MEDICARE: PSA: 0.55 ng/ml (ref 0.10–4.00)

## 2020-03-06 NOTE — Telephone Encounter (Signed)
Labs faxed to [facility] as requested.

## 2020-03-06 NOTE — Telephone Encounter (Signed)
Patient came into office and asked for labs done today to be faxed to [phone] to [facility], Attention Courtney B. Please advise.

## 2020-03-21 ENCOUNTER — Other Ambulatory Visit: Payer: Medicare Other

## 2020-03-27 ENCOUNTER — Other Ambulatory Visit: Payer: PPO

## 2020-03-29 ENCOUNTER — Encounter: Payer: Self-pay | Admitting: Family Medicine

## 2020-03-29 ENCOUNTER — Other Ambulatory Visit: Payer: Self-pay

## 2020-03-29 ENCOUNTER — Ambulatory Visit (INDEPENDENT_AMBULATORY_CARE_PROVIDER_SITE_OTHER): Payer: Medicare Other | Admitting: Family Medicine

## 2020-03-29 VITALS — BP 130/72 | HR 66 | Temp 98.3°F | Ht 68.5 in | Wt 184.5 lb

## 2020-03-29 DIAGNOSIS — Z0000 Encounter for general adult medical examination without abnormal findings: Secondary | ICD-10-CM | POA: Diagnosis not present
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
## 2020-04-03 ENCOUNTER — Encounter: Payer: PPO | Admitting: Family Medicine

## 2020-04-03 DIAGNOSIS — Z96651 Presence of right artificial knee joint: Secondary | ICD-10-CM | POA: Diagnosis not present

## 2020-04-03 DIAGNOSIS — M1711 Unilateral primary osteoarthritis, right knee: Secondary | ICD-10-CM | POA: Diagnosis not present

## 2020-04-03 DIAGNOSIS — G8918 Other acute postprocedural pain: Secondary | ICD-10-CM | POA: Diagnosis not present

## 2020-04-10 DIAGNOSIS — R2689 Other abnormalities of gait and mobility: Secondary | ICD-10-CM | POA: Diagnosis not present

## 2020-04-10 DIAGNOSIS — M25561 Pain in right knee: Secondary | ICD-10-CM | POA: Diagnosis not present

## 2020-04-13 DIAGNOSIS — Z96651 Presence of right artificial knee joint: Secondary | ICD-10-CM | POA: Diagnosis not present

## 2020-04-14 DIAGNOSIS — M25561 Pain in right knee: Secondary | ICD-10-CM | POA: Diagnosis not present

## 2020-04-14 DIAGNOSIS — R2689 Other abnormalities of gait and mobility: Secondary | ICD-10-CM | POA: Diagnosis not present

## 2020-04-18 DIAGNOSIS — R2689 Other abnormalities of gait and mobility: Secondary | ICD-10-CM | POA: Diagnosis not present

## 2020-04-18 DIAGNOSIS — M25561 Pain in right knee: Secondary | ICD-10-CM | POA: Diagnosis not present

## 2020-04-21 DIAGNOSIS — M25561 Pain in right knee: Secondary | ICD-10-CM | POA: Diagnosis not present

## 2020-04-21 DIAGNOSIS — R2689 Other abnormalities of gait and mobility: Secondary | ICD-10-CM | POA: Diagnosis not present

## 2020-04-25 DIAGNOSIS — R2689 Other abnormalities of gait and mobility: Secondary | ICD-10-CM | POA: Diagnosis not present

## 2020-04-25 DIAGNOSIS — M25561 Pain in right knee: Secondary | ICD-10-CM | POA: Diagnosis not present

## 2020-04-28 DIAGNOSIS — M25561 Pain in right knee: Secondary | ICD-10-CM | POA: Diagnosis not present

## 2020-04-28 DIAGNOSIS — R2689 Other abnormalities of gait and mobility: Secondary | ICD-10-CM | POA: Diagnosis not present

## 2020-05-02 DIAGNOSIS — R2689 Other abnormalities of gait and mobility: Secondary | ICD-10-CM | POA: Diagnosis not present

## 2020-05-02 DIAGNOSIS — M25561 Pain in right knee: Secondary | ICD-10-CM | POA: Diagnosis not present

## 2020-05-05 DIAGNOSIS — R2689 Other abnormalities of gait and mobility: Secondary | ICD-10-CM | POA: Diagnosis not present

## 2020-05-05 DIAGNOSIS — M25561 Pain in right knee: Secondary | ICD-10-CM | POA: Diagnosis not present

## 2020-05-08 DIAGNOSIS — R2689 Other abnormalities of gait and mobility: Secondary | ICD-10-CM | POA: Diagnosis not present

## 2020-05-08 DIAGNOSIS — M25561 Pain in right knee: Secondary | ICD-10-CM | POA: Diagnosis not present

## 2020-05-11 DIAGNOSIS — M25561 Pain in right knee: Secondary | ICD-10-CM | POA: Diagnosis not present

## 2020-05-11 DIAGNOSIS — R2689 Other abnormalities of gait and mobility: Secondary | ICD-10-CM | POA: Diagnosis not present

## 2020-05-15 DIAGNOSIS — M25561 Pain in right knee: Secondary | ICD-10-CM | POA: Diagnosis not present

## 2020-05-15 DIAGNOSIS — R2689 Other abnormalities of gait and mobility: Secondary | ICD-10-CM | POA: Diagnosis not present

## 2020-05-18 DIAGNOSIS — M25561 Pain in right knee: Secondary | ICD-10-CM | POA: Diagnosis not present

## 2020-05-18 DIAGNOSIS — R2689 Other abnormalities of gait and mobility: Secondary | ICD-10-CM | POA: Diagnosis not present

## 2020-05-22 DIAGNOSIS — M25561 Pain in right knee: Secondary | ICD-10-CM | POA: Diagnosis not present

## 2020-05-22 DIAGNOSIS — R2689 Other abnormalities of gait and mobility: Secondary | ICD-10-CM | POA: Diagnosis not present

## 2020-05-24 DIAGNOSIS — R2689 Other abnormalities of gait and mobility: Secondary | ICD-10-CM | POA: Diagnosis not present

## 2020-05-24 DIAGNOSIS — M25561 Pain in right knee: Secondary | ICD-10-CM | POA: Diagnosis not present

## 2020-05-29 DIAGNOSIS — M25561 Pain in right knee: Secondary | ICD-10-CM | POA: Diagnosis not present

## 2020-05-29 DIAGNOSIS — R2689 Other abnormalities of gait and mobility: Secondary | ICD-10-CM | POA: Diagnosis not present

## 2020-05-31 DIAGNOSIS — M25561 Pain in right knee: Secondary | ICD-10-CM | POA: Diagnosis not present

## 2020-05-31 DIAGNOSIS — R2689 Other abnormalities of gait and mobility: Secondary | ICD-10-CM | POA: Diagnosis not present

## 2020-06-05 DIAGNOSIS — M25561 Pain in right knee: Secondary | ICD-10-CM | POA: Diagnosis not present

## 2020-06-05 DIAGNOSIS — R2689 Other abnormalities of gait and mobility: Secondary | ICD-10-CM | POA: Diagnosis not present

## 2020-06-07 DIAGNOSIS — R2689 Other abnormalities of gait and mobility: Secondary | ICD-10-CM | POA: Diagnosis not present

## 2020-06-07 DIAGNOSIS — M25561 Pain in right knee: Secondary | ICD-10-CM | POA: Diagnosis not present

## 2020-07-04 DIAGNOSIS — Z96651 Presence of right artificial knee joint: Secondary | ICD-10-CM | POA: Diagnosis not present

## 2020-07-31 DIAGNOSIS — L570 Actinic keratosis: Secondary | ICD-10-CM | POA: Diagnosis not present

## 2020-07-31 DIAGNOSIS — L821 Other seborrheic keratosis: Secondary | ICD-10-CM | POA: Diagnosis not present

## 2020-07-31 DIAGNOSIS — L820 Inflamed seborrheic keratosis: Secondary | ICD-10-CM | POA: Diagnosis not present

## 2020-07-31 DIAGNOSIS — Z85828 Personal history of other malignant neoplasm of skin: Secondary | ICD-10-CM | POA: Diagnosis not present

## 2021-01-08 ENCOUNTER — Ambulatory Visit: Payer: Medicare Other | Admitting: Family Medicine

## 2021-01-11 ENCOUNTER — Ambulatory Visit (INDEPENDENT_AMBULATORY_CARE_PROVIDER_SITE_OTHER): Payer: Medicare Other | Admitting: Family Medicine

## 2021-01-11 ENCOUNTER — Other Ambulatory Visit: Payer: Self-pay

## 2021-01-11 ENCOUNTER — Encounter: Payer: Self-pay | Admitting: Family Medicine

## 2021-01-11 VITALS — BP 140/80 | HR 76 | Temp 97.9°F | Ht 68.5 in | Wt 174.2 lb

## 2021-01-11 DIAGNOSIS — Z1211 Encounter for screening for malignant neoplasm of colon: Secondary | ICD-10-CM | POA: Diagnosis not present

## 2021-01-11 DIAGNOSIS — K5900 Constipation, unspecified: Secondary | ICD-10-CM | POA: Diagnosis not present

## 2021-01-11 DIAGNOSIS — Z789 Other specified health status: Secondary | ICD-10-CM

## 2021-01-11 NOTE — Patient Instructions (Addendum)
CONSTIPATION Difficult, uncomfortable, infrequent BM  1. Warm prune juice, hot water, tea, coffee, apple juice 2. Prevention: drink 8 glasses water daily, FIBER (raw fruit, veggies, bran cereal, whole grains), regular exercise 3. Bulk formers like Metamucil (psyllium), Citrucel (methylcellulose) usually help 4. Stool softeners (Docusate) occaisionally OK. 5. Occaisional over the counter Miralax usually safe 6. Overuse of stimulant  laxatives (Ex-Lax, Mag Citrate, Dulcolax, etc.) can be habit forming   SIMPLE CONSTIPATION FORMULA: DOCUSATE 2 TABLETS TWICE A DAY MIRALAX. START WITH 1 DOSE A DAY, THEN INCREASE TO TWICE A DAY, THREE TIMES A DAY, ETC IF NO BOWEL MOVEMENT.     The newer [facility] shot is much better than the older shot. The [facility] shot requires 2 shots given 6 months apart.  Almost always, this shot is not covered in our office by your insurance. It costs 500 dollars, but essentially all insurances cover it at your pharmacy.  I would call your insurance number on your card to confirm this.

## 2021-01-11 NOTE — Progress Notes (Signed)
Edward Trevino T. Monifa Blanchette, MD, [place] at [facility] [place], [address]  Phone: [phone]  FAX: [phone]  ARES CARDOZO - 75 y.o. male  MRN [id number]  Date of Birth: 1945/11/10  Date: 01/11/2021  PCP: Owens Loffler, MD  Referral: Owens Loffler, MD  Chief Complaint  Patient presents with   Constipation   Hemorrhoids    This visit occurred during the SARS-CoV-2 public health emergency.  Safety protocols were in place, including screening questions prior to the visit, additional usage of staff PPE, and extensive cleaning of exam room while observing appropriate contact time as indicated for disinfecting solutions.   Subjective:   KIRAN CARLINE is a 75 y.o. very pleasant male patient with Body mass index is 26.11 kg/m. who presents with the following:  Constipation and hemorrhoids.  This is really been bothering him a lot over the last few months.  11/11/2020.    On the bathroom floor with a bad stomach ache.  Enema, etc.   Large hard stool, then a profound stool.  After he was able to pass this, he has been able to have bowel movements, but they have been hard and in the shape of balls.  No regular bowel movement since then.  Now with still some constipation.  Little balls on stool.   Fleet's sup or enema.   Used to use a fiber pill.  He took some Metamucil.  See more details below.  Immunization History  Administered Date(s) Administered   Fluad Quad(high Dose 65+) 10/27/2018, 11/10/2019   Influenza Split 12/17/2010   Influenza Whole 12/10/2007   Influenza, High Dose Seasonal PF 11/22/2020   Influenza,inj,Quad PF,6+ Mos 02/03/2013, 02/23/2014, 12/29/2014, 12/19/2015, 12/10/2016, 12/10/2016, 11/24/2017   PFIZER(Purple Top)SARS-COV-2 Vaccination 04/01/2019, 04/26/2019, 11/22/2019, 07/03/2020   Pfizer Covid-19 Vaccine Bivalent Booster 89yrs & up 12/07/2020   Pneumococcal Conjugate-13 02/16/2015    Pneumococcal Polysaccharide-23 02/10/2013   Tdap 09/19/2010, 02/09/2014   Zoster, Live 12/28/2010     The 10-year ASCVD risk score (Arnett DK, et al., 2019) is: 23.1%   Values used to calculate the score:     Age: 67 years     Sex: Male     Is Non-Hispanic African American: No     Diabetic: No     Tobacco smoker: No     Systolic Blood Pressure: 300 mmHg     Is BP treated: No     HDL Cholesterol: 78.7 mg/dL     Total Cholesterol: 200 mg/dL   Health Maintenance  Topic Date Due   Zoster Vaccines- Shingrix (1 of 2) Never done   COLONOSCOPY (Pts 45-37yrs Insurance coverage will need to be confirmed)  12/24/2020   TETANUS/TDAP  02/10/2024   Pneumonia Vaccine 38+ Years old  Completed   INFLUENZA VACCINE  Completed   COVID-19 Vaccine  Completed   Hepatitis C Screening  Completed   HPV VACCINES  Aged Out     Review of Systems is noted in the HPI, as appropriate  Objective:   BP 140/80   Pulse 76   Temp 97.9 F (36.6 C) (Temporal)   Ht 5' 8.5" (1.74 m)   Wt 174 lb 4 oz (79 kg)   SpO2 98%   BMI 26.11 kg/m   GEN: No acute distress; alert,appropriate. PULM: Breathing comfortably in no respiratory distress PSYCH: Normally interactive.  ABD: S, NT, ND, + BS, No rebound, No HSM   Laboratory and Imaging Data:  Assessment  and Plan:     ICD-10-CM   1. Constipation, unspecified constipation type  K59.00     2. Screening for malignant neoplasm of colon  Z12.11 Ambulatory referral to Gastroenterology    3. Takes dietary supplements  Z78.9      Total encounter time: 30 minutes. This includes total time spent on the day of encounter.  Very good history of constipation.  He has had to take some fleets enemas and suppositories.  Spent the majority of this office visit going over his supplements, which were 2 pages in length.  I tried to circle those supplements that I thought would likely not be harmful.  I highlighted the ones that would probably be of benefit.  I included  curcumin, flaxseed oil.  Vitamin D and calcium. At the doses listed I do not think specifically the vitamins would be toxic to his kidney or liver.  The known compounds that I reviewed would likely be eliminated in the urine.  Basically, all of the other supplements that are listed have minimal to no benefit in terms of peer-reviewed medical literature.  I explained the nature of the supplement industry where there is no regulation in terms of product make-up, and they are not regulated like the pharmaceutical industry.  I have no way to know for sure, but the large volume of supplements and their interactions may certainly be playing a role in his constipation.  For now, I would eliminate what he is comfortable eliminating and add a basic stool softener.  Docusate 1 tab BID only  Patient Instructions  CONSTIPATION Difficult, uncomfortable, infrequent BM  1. Warm prune juice, hot water, tea, coffee, apple juice 2. Prevention: drink 8 glasses water daily, FIBER (raw fruit, veggies, bran cereal, whole grains), regular exercise 3. Bulk formers like Metamucil (psyllium), Citrucel (methylcellulose) usually help 4. Stool softeners (Docusate) occaisionally OK. 5. Occaisional over the counter Miralax usually safe 6. Overuse of stimulant  laxatives (Ex-Lax, Mag Citrate, Dulcolax, etc.) can be habit forming   SIMPLE CONSTIPATION FORMULA: DOCUSATE 2 TABLETS TWICE A DAY MIRALAX. START WITH 1 DOSE A DAY, THEN INCREASE TO TWICE A DAY, THREE TIMES A DAY, ETC IF NO BOWEL MOVEMENT.     The newer [facility] shot is much better than the older shot. The [facility] shot requires 2 shots given 6 months apart.  Almost always, this shot is not covered in our office by your insurance. It costs 500 dollars, but essentially all insurances cover it at your pharmacy.  I would call your insurance number on your card to confirm this.    No orders of the defined types were placed in this encounter.  There are no  discontinued medications. Orders Placed This Encounter  Procedures   Ambulatory referral to Gastroenterology    Follow-up: No follow-ups on file.  Dragon [employer] One speech-to-text software was used for transcription in this dictation.  Possible transcriptional errors can occur using Editor, commissioning.   Signed,  Maud Deed. Casanova Schurman, MD   Outpatient Encounter Medications as of 01/11/2021  Medication Sig   B Complex Vitamins (B COMPLEX PO) Take 1 tablet by mouth daily.   CALCIUM-MAGNESIUM-ZINC PO Take 1 tablet by mouth daily.   calcium-vitamin D (OSCAL WITH D) 500-200 MG-UNIT tablet Take 1 tablet by mouth daily.   cholecalciferol (VITAMIN D3) 25 MCG (1000 UNIT) tablet Take 1,000 Units by mouth daily.   Coenzyme Q10 (COQ10) 200 MG CAPS Take 2 tablets by mouth daily.   Cyanocobalamin (B-12) 5000 MCG CAPS  Take 1 capsule by mouth daily.   FIBER PO Take 1 each by mouth daily. Dr. Delena Bali   hydrocortisone 2.5 % cream Apply topically 2 (two) times daily.   Magnesium 500 MG TABS Take 1 tablet by mouth daily.   Multiple Vitamins-Minerals (MULTIVITAMIN WITH MINERALS) tablet Take 1 tablet by mouth daily.   Nutritional Supplements (ADULT NUTRITIONAL SUPPLEMENT PO) Energy B12 200 mg-2 tablets by mouth daily   Turmeric 500 MG TABS Take 1 tablet by mouth daily.   vitamin C (ASCORBIC ACID) 500 MG tablet Take 500 mg by mouth daily.   vitamin E 180 MG (400 UNITS) capsule Take 400 Units by mouth daily.   No facility-administered encounter medications on file as of 01/11/2021.

## 2021-01-11 NOTE — Addendum Note (Signed)
Addended by: Carter Kitten on: 01/11/2021 12:45 PM   Modules accepted: Orders

## 2021-01-24 ENCOUNTER — Encounter: Payer: Self-pay | Admitting: Internal Medicine

## 2021-01-29 DIAGNOSIS — D2261 Melanocytic nevi of right upper limb, including shoulder: Secondary | ICD-10-CM | POA: Diagnosis not present

## 2021-01-29 DIAGNOSIS — L570 Actinic keratosis: Secondary | ICD-10-CM | POA: Diagnosis not present

## 2021-01-29 DIAGNOSIS — L821 Other seborrheic keratosis: Secondary | ICD-10-CM | POA: Diagnosis not present

## 2021-01-29 DIAGNOSIS — Z85828 Personal history of other malignant neoplasm of skin: Secondary | ICD-10-CM | POA: Diagnosis not present

## 2021-03-01 DIAGNOSIS — M25611 Stiffness of right shoulder, not elsewhere classified: Secondary | ICD-10-CM | POA: Diagnosis not present

## 2021-03-01 DIAGNOSIS — M7581 Other shoulder lesions, right shoulder: Secondary | ICD-10-CM | POA: Diagnosis not present

## 2021-03-01 DIAGNOSIS — M25711 Osteophyte, right shoulder: Secondary | ICD-10-CM | POA: Diagnosis not present

## 2021-03-01 DIAGNOSIS — M7541 Impingement syndrome of right shoulder: Secondary | ICD-10-CM | POA: Diagnosis not present

## 2021-03-01 DIAGNOSIS — M19011 Primary osteoarthritis, right shoulder: Secondary | ICD-10-CM | POA: Diagnosis not present

## 2021-03-02 ENCOUNTER — Other Ambulatory Visit: Payer: Self-pay | Admitting: Orthopedic Surgery

## 2021-03-02 DIAGNOSIS — M25511 Pain in right shoulder: Secondary | ICD-10-CM

## 2021-03-05 ENCOUNTER — Ambulatory Visit
Admission: RE | Admit: 2021-03-05 | Discharge: 2021-03-05 | Disposition: A | Discharge status: Home / Self Care | Payer: Medicare Other | Source: Ambulatory Visit | Attending: Orthopedic Surgery | Admitting: Orthopedic Surgery

## 2021-03-05 DIAGNOSIS — M25511 Pain in right shoulder: Secondary | ICD-10-CM

## 2021-03-06 DIAGNOSIS — M7541 Impingement syndrome of right shoulder: Secondary | ICD-10-CM | POA: Diagnosis not present

## 2021-03-06 DIAGNOSIS — M75121 Complete rotator cuff tear or rupture of right shoulder, not specified as traumatic: Secondary | ICD-10-CM | POA: Diagnosis not present

## 2021-03-08 ENCOUNTER — Telehealth: Payer: Self-pay | Admitting: Internal Medicine

## 2021-03-08 NOTE — Telephone Encounter (Signed)
It will be necessary for him to lie on his left side, but not his right.  If he can lie on his left side without problem he can keep the scheduled procedure

## 2021-03-08 NOTE — Telephone Encounter (Signed)
Patient called and stated that he is going to be having surgery on his shoulder and is concerned that it have cause issues for his colonoscopy. Seeking advice please advise.

## 2021-03-08 NOTE — Telephone Encounter (Signed)
Left message for pt to call back.  Pt states he has torn his labrum/rotator cuff/bicep on his right shoulder. He is scheduled for a colon on 03/30/21. He wants to know if he can still do procedure as he will not be able to lay on his right side. Please advise if ok to keep as scheduled.

## 2021-03-08 NOTE — Telephone Encounter (Signed)
Spoke with pt and he is aware. 

## 2021-03-09 ENCOUNTER — Other Ambulatory Visit: Payer: Self-pay | Admitting: Orthopaedic Surgery

## 2021-03-09 DIAGNOSIS — M25511 Pain in right shoulder: Secondary | ICD-10-CM | POA: Diagnosis not present

## 2021-03-12 ENCOUNTER — Telehealth: Payer: Self-pay | Admitting: Family Medicine

## 2021-03-12 NOTE — Telephone Encounter (Signed)
Pt called stating that he never had a health advisor visit and wanted to know what that  was all about and does he really need it. Pt would like a call back to discuss. Please advise.

## 2021-03-12 NOTE — Telephone Encounter (Signed)
Spoke with Kyle [name] and advised what the Medicare Wellness Visit was with our health educator.  He is agreeable to keeping that appointment.  I did advise that the appointment would be done by phone call.

## 2021-03-14 ENCOUNTER — Other Ambulatory Visit: Payer: No Typology Code available for payment source

## 2021-03-14 ENCOUNTER — Other Ambulatory Visit: Payer: Self-pay | Admitting: Family Medicine

## 2021-03-14 DIAGNOSIS — Z125 Encounter for screening for malignant neoplasm of prostate: Secondary | ICD-10-CM

## 2021-03-14 DIAGNOSIS — R7309 Other abnormal glucose: Secondary | ICD-10-CM

## 2021-03-14 DIAGNOSIS — E7800 Pure hypercholesterolemia, unspecified: Secondary | ICD-10-CM

## 2021-03-14 DIAGNOSIS — R5383 Other fatigue: Secondary | ICD-10-CM

## 2021-03-15 ENCOUNTER — Ambulatory Visit (AMBULATORY_SURGERY_CENTER): Payer: Medicare Other | Admitting: *Deleted

## 2021-03-15 ENCOUNTER — Other Ambulatory Visit: Payer: Self-pay

## 2021-03-15 VITALS — Ht 68.5 in | Wt 172.0 lb

## 2021-03-15 DIAGNOSIS — Z86010 Personal history of colonic polyps: Secondary | ICD-10-CM

## 2021-03-15 MED ORDER — NA SULFATE-K SULFATE-MG SULF 17.5-3.13-1.6 GM/177ML PO SOLN: 1.0000 | Freq: Once | ORAL | 0 refills | Status: AC

## 2021-03-15 NOTE — Progress Notes (Signed)
No egg or soy allergy known to patient  No issues known to pt with past sedation with any surgeries or procedures Patient denies ever being told they had issues or difficulty with intubation  No FH of Malignant Hyperthermia Pt is not on diet pills Pt is not on  home 02  Pt is not on blood thinners  Pt states was having issues with constipation - using Docusate per PCP -and since started having soft BM's every 2-3 days - occ has a hard ball but not normally on the Docusate  No A fib or A flutter  Pt is fully vaccinated  for Covid   NO PA's for preps discussed with pt In PV today  Discussed with pt there will be an out-of-pocket cost for prep and that varies from $0 to 70 +  dollars - pt verbalized understanding   Due to the COVID-19 pandemic we are asking patients to follow certain guidelines in PV and the [place]   Pt aware of COVID protocols and [facility] guidelines   PV completed over the phone. Pt verified name, DOB, address and insurance during PV today.  Pt mailed instruction packet with copy of consent form to read and not return, and instructions.  Pt encouraged to call with questions or issues.  If pt has My chart, procedure instructions sent via My Chart

## 2021-03-16 ENCOUNTER — Ambulatory Visit
Admission: RE | Admit: 2021-03-16 | Discharge: 2021-03-16 | Disposition: A | Discharge status: Home / Self Care | Payer: No Typology Code available for payment source | Source: Ambulatory Visit | Attending: Orthopaedic Surgery | Admitting: Orthopaedic Surgery

## 2021-03-16 DIAGNOSIS — M25511 Pain in right shoulder: Secondary | ICD-10-CM

## 2021-03-16 DIAGNOSIS — M19011 Primary osteoarthritis, right shoulder: Secondary | ICD-10-CM | POA: Diagnosis not present

## 2021-03-23 ENCOUNTER — Encounter: Payer: Self-pay | Admitting: Internal Medicine

## 2021-03-27 ENCOUNTER — Encounter (HOSPITAL_BASED_OUTPATIENT_CLINIC_OR_DEPARTMENT_OTHER): Payer: Self-pay | Admitting: Orthopaedic Surgery

## 2021-03-27 ENCOUNTER — Other Ambulatory Visit: Payer: Self-pay

## 2021-03-29 ENCOUNTER — Ambulatory Visit (AMBULATORY_SURGERY_CENTER): Payer: No Typology Code available for payment source | Admitting: Internal Medicine

## 2021-03-29 ENCOUNTER — Other Ambulatory Visit: Payer: Self-pay

## 2021-03-29 ENCOUNTER — Encounter: Payer: Self-pay | Admitting: Internal Medicine

## 2021-03-29 VITALS — BP 107/66 | HR 77 | Temp 96.9°F | Resp 15 | Ht 68.5 in | Wt 172.0 lb

## 2021-03-29 DIAGNOSIS — Z86010 Personal history of colonic polyps: Secondary | ICD-10-CM

## 2021-03-29 DIAGNOSIS — Z1211 Encounter for screening for malignant neoplasm of colon: Secondary | ICD-10-CM | POA: Diagnosis not present

## 2021-03-29 DIAGNOSIS — D128 Benign neoplasm of rectum: Secondary | ICD-10-CM

## 2021-03-29 DIAGNOSIS — D125 Benign neoplasm of sigmoid colon: Secondary | ICD-10-CM | POA: Diagnosis not present

## 2021-03-29 DIAGNOSIS — K621 Rectal polyp: Secondary | ICD-10-CM

## 2021-03-29 DIAGNOSIS — K5909 Other constipation: Secondary | ICD-10-CM | POA: Diagnosis not present

## 2021-03-29 DIAGNOSIS — D129 Benign neoplasm of anus and anal canal: Secondary | ICD-10-CM

## 2021-03-29 DIAGNOSIS — D123 Benign neoplasm of transverse colon: Secondary | ICD-10-CM | POA: Diagnosis not present

## 2021-03-29 MED ORDER — SODIUM CHLORIDE 0.9 % IV SOLN: 500.0000 mL | Freq: Once | INTRAVENOUS | Status: DC

## 2021-03-29 MED ORDER — LINACLOTIDE 145 MCG PO CAPS: 145.0000 ug | ORAL_CAPSULE | Freq: Every day | ORAL | Status: DC

## 2021-03-29 NOTE — Progress Notes (Signed)
Called to room to assist during endoscopic procedure.  Patient ID and intended procedure confirmed with present staff. Received instructions for my participation in the procedure from the performing physician.  

## 2021-03-29 NOTE — Progress Notes (Signed)
Printed strip in PACU to give to pt about possible a-fib episode.  EKG strip in PACU was more regular once he was more alert but still questionable for a-fib/flutter.  Pt has upcoming shoulder surgery.  Dr Hilarie Fredrickson will make pt aware
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
## 2021-03-29 NOTE — Patient Instructions (Addendum)
Take your new medicine as directed.  If you have any shortness of breath, unusual chest pain or pressure, dizziness, go to the ED as soon as possible.  Show the surgeon your heart rate strip before your surgery.  YOU HAD AN ENDOSCOPIC PROCEDURE TODAY AT [facility]:   Refer to the procedure report that was given to you for any specific questions about what was found during the examination.  If the procedure report does not answer your questions, please call your gastroenterologist to clarify.  If you requested that your care partner not be given the details of your procedure findings, then the procedure report has been included in a sealed envelope for you to review at your convenience later.  YOU SHOULD EXPECT: Some feelings of bloating in the abdomen. Passage of more gas than usual.  Walking can help get rid of the air that was put into your GI tract during the procedure and reduce the bloating. If you had a lower endoscopy (such as a colonoscopy or flexible sigmoidoscopy) you may notice spotting of blood in your stool or on the toilet paper. If you underwent a bowel prep for your procedure, you may not have a normal bowel movement for a few days.  Please Note:  You might notice some irritation and congestion in your nose or some drainage.  This is from the oxygen used during your procedure.  There is no need for concern and it should clear up in a day or so.  SYMPTOMS TO REPORT IMMEDIATELY:  Following lower endoscopy (colonoscopy or flexible sigmoidoscopy):  Excessive amounts of blood in the stool  Significant tenderness or worsening of abdominal pains  Swelling of the abdomen that is new, acute  Fever of 100F or higher    For urgent or emergent issues, a gastroenterologist can be reached at any hour by calling [phone]. Do not use MyChart messaging for urgent concerns.    DIET:  We do recommend a small meal at first, but then you may proceed to your regular diet.   Drink plenty of fluids but you should avoid alcoholic beverages for 24 hours.  Eat a high fiber diet, and drink plenty of water.  Take your new medication as ordered.  ACTIVITY:  You should plan to take it easy for the rest of today and you should NOT DRIVE or use heavy machinery until tomorrow (because of the sedation medicines used during the test).    FOLLOW UP: Our staff will call the number listed on your records 48-72 hours following your procedure to check on you and address any questions or concerns that you may have regarding the information given to you following your procedure. If we do not reach you, we will leave a message.  We will attempt to reach you two times.  During this call, we will ask if you have developed any symptoms of COVID 19. If you develop any symptoms (ie: fever, flu-like symptoms, shortness of breath, cough etc.) before then, please call [phone].  If you test positive for Covid 19 in the 2 weeks post procedure, please call and report this information to [place].    If any biopsies were taken you will be contacted by phone or by letter within the next 1-3 weeks.  Please call us at [phone] if you have not heard about the biopsies in 3 weeks.    SIGNATURES/CONFIDENTIALITY: You and/or your care partner have signed paperwork which will be entered into your electronic medical  record.  These signatures attest to the fact that that the information above on your After Visit Summary has been reviewed and is understood.  Full responsibility of the confidentiality of this discharge information lies with you and/or your care-partner.

## 2021-03-29 NOTE — Op Note (Signed)
Kyle [name]: Kyle [name] Procedure Date: 03/29/2021 8:41 AM MRN: [id number] Endoscopist: Jerene Bears , MD Age: 76 Referring MD:  Date of Birth: 1945/03/14 Gender: Male Account #: [id number] Procedure:                Colonoscopy Indications:              High risk colon cancer surveillance: Personal                            history of colonic polyps, Last colonoscopy: 2017 Medicines:                Monitored Anesthesia Care Procedure:                Pre-Anesthesia Assessment:                           - Prior to the procedure, a History and Physical                            was performed, and patient medications and                            allergies were reviewed. The patient's tolerance of                            previous anesthesia was also reviewed. The risks                            and benefits of the procedure and the sedation                            options and risks were discussed with the patient.                            All questions were answered, and informed consent                            was obtained. Prior Anticoagulants: The patient has                            taken no previous anticoagulant or antiplatelet                            agents. ASA Grade Assessment: II - A patient with                            mild systemic disease. After reviewing the risks                            and benefits, the patient was deemed in                            satisfactory condition to undergo the procedure.  After obtaining informed consent, the colonoscope                            was passed under direct vision. Throughout the                            procedure, the patient's blood pressure, pulse, and                            oxygen saturations were monitored continuously. The                            CF [id number] #[id number] was introduced through the anus                            and advanced to the  cecum, identified by                            appendiceal orifice and ileocecal valve. The                            colonoscopy was performed without difficulty. The                            patient tolerated the procedure well. The quality                            of the bowel preparation was good. The ileocecal                            valve, appendiceal orifice, and rectum were                            photographed. Scope In: 8:56:16 AM Scope Out: 9:22:08 AM Scope Withdrawal Time: 0 hours 18 minutes 39 seconds  Total Procedure Duration: 0 hours 25 minutes 52 seconds  Findings:                 The digital rectal exam was normal.                           A 4 mm polyp was found in the transverse colon. The                            polyp was sessile. The polyp was removed with a                            cold snare. Resection and retrieval were complete.                           A 3 mm polyp was found in the distal sigmoid colon.                            The polyp was sessile. The  polyp was removed with a                            cold snare. Resection and retrieval were complete.                           A 6 mm polyp was found in the rectum. The polyp was                            sessile. The polyp was removed with a cold snare.                            Resection and retrieval were complete.                           Multiple small and large-mouthed diverticula were                            found in the sigmoid colon.                           A diffuse area of moderate melanosis was found in                            the entire colon (right colon > left colon, minimal                            in rectum)                           Internal hemorrhoids were found during                            retroflexion. The hemorrhoids were small. Complications:            No immediate complications. Estimated Blood Loss:     Estimated blood loss was  minimal. Impression:               - One 4 mm polyp in the transverse colon, removed                            with a cold snare. Resected and retrieved.                           - One 3 mm polyp in the distal sigmoid colon,                            removed with a cold snare. Resected and retrieved.                           - One 6 mm polyp in the rectum, removed with a cold                            snare. Resected and retrieved.                           -  Severe diverticulosis in the sigmoid colon.                           - Melanosis in the colon.                           - Internal hemorrhoids. Recommendation:           - Patient has a contact number available for                            emergencies. The signs and symptoms of potential                            delayed complications were discussed with the                            patient. Return to normal activities tomorrow.                            Written discharge instructions were provided to the                            patient.                           - Resume previous diet.                           - Continue present medications.                           - Trial of Linzess 145 mcg daily for constipation                            (this can replace other laxatives and stools                            softeners).                           - Await pathology results.                           - Repeat colonoscopy may be recommended for                            surveillance versus discontinuation of surveillance                            based on age. The colonoscopy date will be                            determined after pathology results from today's                            exam become available for review. Ulice Dash  Everitt Amber, MD 03/29/2021 9:28:57 AM This report has been signed electronically.

## 2021-03-29 NOTE — Progress Notes (Signed)
Dr to speak to patient regarding change in heart rhythm.  See strips in chart.  Patient has copies.  Patient instructed to go to the ED if he has chest pain, shortness of breath or pressure in his chest.  Patient instructed to inform the surgeon with his rhythm strip before his surgery per Dr. Hilarie Fredrickson.  Denies symptoms at this time.
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
## 2021-03-30 ENCOUNTER — Telehealth: Payer: Self-pay

## 2021-03-30 ENCOUNTER — Other Ambulatory Visit (INDEPENDENT_AMBULATORY_CARE_PROVIDER_SITE_OTHER): Payer: No Typology Code available for payment source

## 2021-03-30 ENCOUNTER — Ambulatory Visit (INDEPENDENT_AMBULATORY_CARE_PROVIDER_SITE_OTHER): Payer: No Typology Code available for payment source

## 2021-03-30 VITALS — Ht 68.0 in | Wt 172.0 lb

## 2021-03-30 DIAGNOSIS — Z125 Encounter for screening for malignant neoplasm of prostate: Secondary | ICD-10-CM

## 2021-03-30 DIAGNOSIS — E7800 Pure hypercholesterolemia, unspecified: Secondary | ICD-10-CM | POA: Diagnosis not present

## 2021-03-30 DIAGNOSIS — R5383 Other fatigue: Secondary | ICD-10-CM

## 2021-03-30 DIAGNOSIS — R7309 Other abnormal glucose: Secondary | ICD-10-CM | POA: Diagnosis not present

## 2021-03-30 DIAGNOSIS — Z0000 Encounter for general adult medical examination without abnormal findings: Secondary | ICD-10-CM

## 2021-03-30 LAB — HEPATIC FUNCTION PANEL
ALT: 16 U/L (ref 0–53)
AST: 21 U/L (ref 0–37)
Albumin: 3.9 g/dL (ref 3.5–5.2)
Alkaline Phosphatase: 64 U/L (ref 39–117)
Bilirubin, Direct: 0.1 mg/dL (ref 0.0–0.3)
Total Bilirubin: 0.7 mg/dL (ref 0.2–1.2)
Total Protein: 7.2 g/dL (ref 6.0–8.3)

## 2021-03-30 LAB — BASIC METABOLIC PANEL
BUN: 11 mg/dL (ref 6–23)
CO2: 32 mEq/L (ref 19–32)
Calcium: 9.2 mg/dL (ref 8.4–10.5)
Chloride: 98 mEq/L (ref 96–112)
Creatinine, Ser: 0.88 mg/dL (ref 0.40–1.50)
GFR: 84.77 mL/min (ref >60.00)
Glucose, Bld: 96 mg/dL (ref 70–99)
Potassium: 4.5 mEq/L (ref 3.5–5.1)
Sodium: 135 mEq/L (ref 135–145)

## 2021-03-30 LAB — CBC WITH DIFFERENTIAL/PLATELET
Basophils Absolute: 0 10*3/uL (ref 0.0–0.1)
Basophils Relative: 0.5 % (ref 0.0–3.0)
Eosinophils Absolute: 0.3 10*3/uL (ref 0.0–0.7)
Eosinophils Relative: 3.2 % (ref 0.0–5.0)
HCT: 42.9 % (ref 39.0–52.0)
Hemoglobin: 14.5 g/dL (ref 13.0–17.0)
Lymphocytes Relative: 31 % (ref 12.0–46.0)
Lymphs Abs: 2.8 10*3/uL (ref 0.7–4.0)
MCHC: 33.8 g/dL (ref 30.0–36.0)
MCV: 93.1 fl (ref 78.0–100.0)
Monocytes Absolute: 0.7 10*3/uL (ref 0.1–1.0)
Monocytes Relative: 8 % (ref 3.0–12.0)
Neutro Abs: 5.2 10*3/uL (ref 1.4–7.7)
Neutrophils Relative %: 57.3 % (ref 43.0–77.0)
Platelets: 289 10*3/uL (ref 150.0–400.0)
RBC: 4.61 Mil/uL (ref 4.22–5.81)
RDW: 12.7 % (ref 11.5–15.5)
WBC: 9.1 10*3/uL (ref 4.0–10.5)

## 2021-03-30 LAB — LIPID PANEL
Cholesterol: 191 mg/dL (ref 0–200)
HDL: 75.4 mg/dL (ref >39.00)
LDL Cholesterol: 106 mg/dL — ABNORMAL HIGH (ref 0–99)
NonHDL: 115.92
Total CHOL/HDL Ratio: 3
Triglycerides: 51 mg/dL (ref 0.0–149.0)
VLDL: 10.2 mg/dL (ref 0.0–40.0)

## 2021-03-30 LAB — HEMOGLOBIN A1C: Hgb A1c MFr Bld: 5.5 % (ref 4.6–6.5)

## 2021-03-30 LAB — PSA, MEDICARE: PSA: 0.75 ng/ml (ref 0.10–4.00)

## 2021-03-30 MED ORDER — LINACLOTIDE 145 MCG PO CAPS: 145.0000 ug | ORAL_CAPSULE | Freq: Every day | ORAL | 2 refills | Status: DC

## 2021-03-30 NOTE — Patient Instructions (Signed)
[name] , Thank you for taking time to complete your Medicare Wellness Visit. I appreciate your ongoing commitment to your health goals. Please review the following plan we discussed and let me know if I can assist you in the future.   Screening recommendations/referrals: Colonoscopy: up to date, last completed 03/29/21 Recommended yearly ophthalmology/optometry visit for glaucoma screening and checkup Recommended yearly dental visit for hygiene and checkup  Vaccinations: Influenza vaccine: up to date  Pneumococcal vaccine: up to date  Tdap vaccine: up to date , completed 02/09/14, due 02/10/24 Shingles vaccine: 1st dose completed 03/03/21   Covid-19: up to date   Advanced directives: Please bring a copy of Living Will and/or [place] for your chart.   Conditions/risks identified: see problem list   Next appointment: Follow up in one year for your annual wellness visit. 04/01/22 @ 9:45am , this will be a telephone visit.   Preventive Care 45 Years and Older, Male Preventive care refers to lifestyle choices and visits with your health care provider that can promote health and wellness. What does preventive care include? A yearly physical exam. This is also called an annual well check. Dental exams once or twice a year. Routine eye exams. Ask your health care provider how often you should have your eyes checked. Personal lifestyle choices, including: Daily care of your teeth and gums. Regular physical activity. Eating a healthy diet. Avoiding tobacco and drug use. Limiting alcohol use. Practicing safe sex. Taking low doses of aspirin every day. Taking vitamin and mineral supplements as recommended by your health care provider. What happens during an annual well check? The services and screenings done by your health care provider during your annual well check will depend on your age, overall health, lifestyle risk factors, and family history of disease. Counseling   Your health care provider may ask you questions about your: Alcohol use. Tobacco use. Drug use. Emotional well-being. Home and relationship well-being. Sexual activity. Eating habits. History of falls. Memory and ability to understand (cognition). Work and work Statistician. Screening  You may have the following tests or measurements: Height, weight, and BMI. Blood pressure. Lipid and cholesterol levels. These may be checked every 5 years, or more frequently if you are over 85 years old. Skin check. Lung cancer screening. You may have this screening every year starting at age 72 if you have a 30-pack-year history of smoking and currently smoke or have quit within the past 15 years. Fecal occult blood test (FOBT) of the stool. You may have this test every year starting at age 46. Flexible sigmoidoscopy or colonoscopy. You may have a sigmoidoscopy every 5 years or a colonoscopy every 10 years starting at age 8. Prostate cancer screening. Recommendations will vary depending on your family history and other risks. Hepatitis C blood test. Hepatitis B blood test. Sexually transmitted disease (STD) testing. Diabetes screening. This is done by checking your blood sugar (glucose) after you have not eaten for a while (fasting). You may have this done every 1-3 years. Abdominal aortic aneurysm (AAA) screening. You may need this if you are a current or former smoker. Osteoporosis. You may be screened starting at age 28 if you are at high risk. Talk with your health care provider about your test results, treatment options, and if necessary, the need for more tests. Vaccines  Your health care provider may recommend certain vaccines, such as: Influenza vaccine. This is recommended every year. Tetanus, diphtheria, and acellular pertussis (Tdap, Td) vaccine. You may  need a Td booster every 10 years. Zoster vaccine. You may need this after age 47. Pneumococcal 13-valent conjugate (PCV13) vaccine.  One dose is recommended after age 60. Pneumococcal polysaccharide (PPSV23) vaccine. One dose is recommended after age 70. Talk to your health care provider about which screenings and vaccines you need and how often you need them. This information is not intended to replace advice given to you by your health care provider. Make sure you discuss any questions you have with your health care provider. Document Released: 03/10/2015 Document Revised: 11/01/2015 Document Reviewed: 12/13/2014 Elsevier Interactive Patient Education  2017 [place] Prevention in the Home Falls can cause injuries. They can happen to people of all ages. There are many things you can do to make your home safe and to help prevent falls. What can I do on the outside of my home? Regularly fix the edges of walkways and driveways and fix any cracks. Remove anything that might make you trip as you walk through a door, such as a raised step or threshold. Trim any bushes or trees on the path to your home. Use bright outdoor lighting. Clear any walking paths of anything that might make someone trip, such as rocks or tools. Regularly check to see if handrails are loose or broken. Make sure that both sides of any steps have handrails. Any raised decks and porches should have guardrails on the edges. Have any leaves, snow, or ice cleared regularly. Use sand or salt on walking paths during winter. Clean up any spills in your garage right away. This includes oil or grease spills. What can I do in the bathroom? Use night lights. Install grab bars by the toilet and in the tub and shower. Do not use towel bars as grab bars. Use non-skid mats or decals in the tub or shower. If you need to sit down in the shower, use a plastic, non-slip stool. Keep the floor dry. Clean up any water that spills on the floor as soon as it happens. Remove soap buildup in the tub or shower regularly. Attach bath mats securely with double-sided  non-slip rug tape. Do not have throw rugs and other things on the floor that can make you trip. What can I do in the bedroom? Use night lights. Make sure that you have a light by your bed that is easy to reach. Do not use any sheets or blankets that are too big for your bed. They should not hang down onto the floor. Have a firm chair that has side arms. You can use this for support while you get dressed. Do not have throw rugs and other things on the floor that can make you trip. What can I do in the kitchen? Clean up any spills right away. Avoid walking on wet floors. Keep items that you use a lot in easy-to-reach places. If you need to reach something above you, use a strong step stool that has a grab bar. Keep electrical cords out of the way. Do not use floor polish or wax that makes floors slippery. If you must use wax, use non-skid floor wax. Do not have throw rugs and other things on the floor that can make you trip. What can I do with my stairs? Do not leave any items on the stairs. Make sure that there are handrails on both sides of the stairs and use them. Fix handrails that are broken or loose. Make sure that handrails are as long as the stairways.  Check any carpeting to make sure that it is firmly attached to the stairs. Fix any carpet that is loose or worn. Avoid having throw rugs at the top or bottom of the stairs. If you do have throw rugs, attach them to the floor with carpet tape. Make sure that you have a light switch at the top of the stairs and the bottom of the stairs. If you do not have them, ask someone to add them for you. What else can I do to help prevent falls? Wear shoes that: Do not have high heels. Have rubber bottoms. Are comfortable and fit you well. Are closed at the toe. Do not wear sandals. If you use a stepladder: Make sure that it is fully opened. Do not climb a closed stepladder. Make sure that both sides of the stepladder are locked into place. Ask  someone to hold it for you, if possible. Clearly mark and make sure that you can see: Any grab bars or handrails. First and last steps. Where the edge of each step is. Use tools that help you move around (mobility aids) if they are needed. These include: Canes. Walkers. Scooters. Crutches. Turn on the lights when you go into a dark area. Replace any light bulbs as soon as they burn out. Set up your furniture so you have a clear path. Avoid moving your furniture around. If any of your floors are uneven, fix them. If there are any pets around you, be aware of where they are. Review your medicines with your doctor. Some medicines can make you feel dizzy. This can increase your chance of falling. Ask your doctor what other things that you can do to help prevent falls. This information is not intended to replace advice given to you by your health care provider. Make sure you discuss any questions you have with your health care provider. Document Released: 12/08/2008 Document Revised: 07/20/2015 Document Reviewed: 03/18/2014 Elsevier Interactive Patient Education  2017 [employer].

## 2021-03-30 NOTE — Telephone Encounter (Signed)
Notified by front desk staff that patient called and is at the pharmacy, but his [facility] hasn't been called in from yesterday.   Per chart review, [facility] did not go through yesterday.  Was resent today. SChaplin, RN,BSN

## 2021-03-30 NOTE — Addendum Note (Signed)
Addended by: Higinio Roger on: 03/30/2021 08:23 AM   Modules accepted: Orders

## 2021-04-02 ENCOUNTER — Telehealth: Payer: Self-pay | Admitting: *Deleted

## 2021-04-02 ENCOUNTER — Encounter (HOSPITAL_BASED_OUTPATIENT_CLINIC_OR_DEPARTMENT_OTHER)
Admission: RE | Admit: 2021-04-02 | Discharge: 2021-04-02 | Disposition: A | Discharge status: Home / Self Care | Payer: No Typology Code available for payment source | Source: Ambulatory Visit | Attending: Orthopaedic Surgery | Admitting: Orthopaedic Surgery

## 2021-04-02 ENCOUNTER — Telehealth: Payer: Self-pay

## 2021-04-02 DIAGNOSIS — M659 Synovitis and tenosynovitis, unspecified: Secondary | ICD-10-CM | POA: Diagnosis not present

## 2021-04-02 DIAGNOSIS — M75101 Unspecified rotator cuff tear or rupture of right shoulder, not specified as traumatic: Secondary | ICD-10-CM | POA: Diagnosis not present

## 2021-04-02 DIAGNOSIS — Z01812 Encounter for preprocedural laboratory examination: Secondary | ICD-10-CM | POA: Insufficient documentation

## 2021-04-02 DIAGNOSIS — Z96653 Presence of artificial knee joint, bilateral: Secondary | ICD-10-CM | POA: Diagnosis not present

## 2021-04-02 DIAGNOSIS — M25711 Osteophyte, right shoulder: Secondary | ICD-10-CM | POA: Diagnosis not present

## 2021-04-02 LAB — SURGICAL PCR SCREEN
MRSA, PCR: NEGATIVE
Staphylococcus aureus: NEGATIVE

## 2021-04-02 NOTE — Telephone Encounter (Signed)
Called #[phone] and left a message we tried to reach pt for a follow up call. maw
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
## 2021-04-02 NOTE — Progress Notes (Signed)
Surgical soap given with instructions, pt verbalized understanding.Enhanced Recovery after Surgery  Enhanced Recovery after Surgery is a protocol used to improve the stress on your body and your recovery after surgery.  Patient Instructions  The night before surgery:  No food after midnight. ONLY clear liquids after midnight  The day of surgery (if you do NOT have diabetes):  Drink ONE (1) Pre-Surgery Clear Ensure as directed.   This drink was given to you during your hospital  pre-op appointment visit. The pre-op nurse will instruct you on the time to drink the  Pre-Surgery Ensure depending on your surgery time. Finish the drink at the designated time by the pre-op nurse.  Nothing else to drink after completing the  Pre-Surgery Clear Ensure.  The day of surgery (if you have diabetes): Drink ONE (1) Gatorade 2 (G2) as directed. This drink was given to you during your hospital  pre-op appointment visit.  The pre-op nurse will instruct you on the time to drink the   Gatorade 2 (G2) depending on your surgery time. Color of the Gatorade may vary. Red is not allowed. Nothing else to drink after completing the  Gatorade 2 (G2).         If office.you have questions, please contact your surgeon's office Benzoyl peroxide gel given with instructions, pt verbalized understanding.  

## 2021-04-02 NOTE — Telephone Encounter (Signed)
Attempted 2nd f/u phone call. No answer. Left message.  °

## 2021-04-03 ENCOUNTER — Encounter: Payer: Self-pay | Admitting: Internal Medicine

## 2021-04-04 NOTE — Anesthesia Preprocedure Evaluation (Addendum)
Anesthesia Evaluation  Patient identified by MRN, date of birth, ID band Patient awake    Reviewed: Allergy & Precautions, NPO status , Patient's Chart, lab work & pertinent test results  History of Anesthesia Complications Negative for: history of anesthetic complications  Airway Mallampati: II  TM Distance: >3 FB Neck ROM: Full    Dental no notable dental hx. (+) Dental Advisory Given   Pulmonary neg pulmonary ROS,    Pulmonary exam normal        Cardiovascular negative cardio ROS Normal cardiovascular exam     Neuro/Psych negative neurological ROS     GI/Hepatic negative GI ROS, Neg liver ROS,   Endo/Other  negative endocrine ROS  Renal/GU negative Renal ROS     Musculoskeletal negative musculoskeletal ROS (+)   Abdominal   Peds  Hematology negative hematology ROS (+)   Anesthesia Other Findings   Reproductive/Obstetrics                            Anesthesia Physical Anesthesia Plan  ASA: 1  Anesthesia Plan: General   Post-op Pain Management: Celebrex PO (pre-op), Tylenol PO (pre-op) and Regional block   Induction:   PONV Risk Score and Plan: 2 and Ondansetron and Dexamethasone  Airway Management Planned: Oral ETT  Additional Equipment:   Intra-op Plan:   Post-operative Plan: Extubation in OR  Informed Consent: I have reviewed the patients History and Physical, chart, labs and discussed the procedure including the risks, benefits and alternatives for the proposed anesthesia with the patient or authorized representative who has indicated his/her understanding and acceptance.     Dental advisory given  Plan Discussed with: Anesthesiologist and CRNA  Anesthesia Plan Comments:        Anesthesia Quick Evaluation

## 2021-04-05 ENCOUNTER — Other Ambulatory Visit: Payer: Self-pay

## 2021-04-05 ENCOUNTER — Ambulatory Visit (HOSPITAL_BASED_OUTPATIENT_CLINIC_OR_DEPARTMENT_OTHER)
Admission: RE | Admit: 2021-04-05 | Discharge: 2021-04-05 | Disposition: A | Discharge status: Home / Self Care | Payer: No Typology Code available for payment source | Source: Ambulatory Visit | Attending: Orthopaedic Surgery | Admitting: Orthopaedic Surgery

## 2021-04-05 ENCOUNTER — Ambulatory Visit (HOSPITAL_BASED_OUTPATIENT_CLINIC_OR_DEPARTMENT_OTHER): Payer: No Typology Code available for payment source | Admitting: Anesthesiology

## 2021-04-05 ENCOUNTER — Encounter (HOSPITAL_BASED_OUTPATIENT_CLINIC_OR_DEPARTMENT_OTHER)
Admission: RE | Disposition: A | Discharge status: Home / Self Care | Payer: Self-pay | Source: Ambulatory Visit | Attending: Orthopaedic Surgery

## 2021-04-05 ENCOUNTER — Encounter (HOSPITAL_BASED_OUTPATIENT_CLINIC_OR_DEPARTMENT_OTHER): Payer: Self-pay | Admitting: Orthopaedic Surgery

## 2021-04-05 ENCOUNTER — Ambulatory Visit (HOSPITAL_COMMUNITY): Payer: No Typology Code available for payment source

## 2021-04-05 DIAGNOSIS — M75101 Unspecified rotator cuff tear or rupture of right shoulder, not specified as traumatic: Secondary | ICD-10-CM

## 2021-04-05 DIAGNOSIS — Z96611 Presence of right artificial shoulder joint: Secondary | ICD-10-CM | POA: Diagnosis not present

## 2021-04-05 DIAGNOSIS — Z96653 Presence of artificial knee joint, bilateral: Secondary | ICD-10-CM | POA: Insufficient documentation

## 2021-04-05 DIAGNOSIS — M25711 Osteophyte, right shoulder: Secondary | ICD-10-CM | POA: Insufficient documentation

## 2021-04-05 DIAGNOSIS — M659 Synovitis and tenosynovitis, unspecified: Secondary | ICD-10-CM | POA: Insufficient documentation

## 2021-04-05 DIAGNOSIS — Z09 Encounter for follow-up examination after completed treatment for conditions other than malignant neoplasm: Secondary | ICD-10-CM

## 2021-04-05 DIAGNOSIS — M19011 Primary osteoarthritis, right shoulder: Secondary | ICD-10-CM

## 2021-04-05 DIAGNOSIS — G8918 Other acute postprocedural pain: Secondary | ICD-10-CM | POA: Diagnosis not present

## 2021-04-05 HISTORY — DX: Primary osteoarthritis, right shoulder: M19.011

## 2021-04-05 HISTORY — PX: REVERSE SHOULDER ARTHROPLASTY: SHX5054

## 2021-04-05 SURGERY — ARTHROPLASTY, SHOULDER, TOTAL, REVERSE
Anesthesia: General | Site: Shoulder | Laterality: Right

## 2021-04-05 MED ORDER — TRANEXAMIC ACID-NACL 1000-0.7 MG/100ML-% IV SOLN
1000.0000 mg | INTRAVENOUS | Status: AC
  Administered 2021-04-05: 1000 mg via INTRAVENOUS

## 2021-04-05 MED ORDER — DEXAMETHASONE SODIUM PHOSPHATE 4 MG/ML IJ SOLN
INTRAMUSCULAR | Status: DC | PRN
  Administered 2021-04-05: 4 mg via INTRAVENOUS

## 2021-04-05 MED ORDER — BUPIVACAINE LIPOSOME 1.3 % IJ SUSP
INTRAMUSCULAR | Status: DC | PRN
  Administered 2021-04-05: 10 mL via PERINEURAL

## 2021-04-05 MED ORDER — CELECOXIB 200 MG PO CAPS
200.0000 mg | ORAL_CAPSULE | Freq: Once | ORAL | Status: AC
  Administered 2021-04-05: 200 mg via ORAL

## 2021-04-05 MED ORDER — PHENYLEPHRINE HCL (PRESSORS) 10 MG/ML IV SOLN
INTRAVENOUS | Status: DC | PRN
  Administered 2021-04-05: 80 ug via INTRAVENOUS
  Administered 2021-04-05 (×2): 120 ug via INTRAVENOUS

## 2021-04-05 MED ORDER — FENTANYL CITRATE (PF) 100 MCG/2ML IJ SOLN
INTRAMUSCULAR | Status: AC
  Filled 2021-04-05: qty 2

## 2021-04-05 MED ORDER — ALBUMIN HUMAN 5 % IV SOLN
INTRAVENOUS | Status: AC
  Filled 2021-04-05: qty 250

## 2021-04-05 MED ORDER — ONDANSETRON HCL 4 MG PO TABS: 4.0000 mg | ORAL_TABLET | Freq: Three times a day (TID) | ORAL | 0 refills | Status: DC | PRN

## 2021-04-05 MED ORDER — PHENYLEPHRINE HCL (PRESSORS) 10 MG/ML IV SOLN
INTRAVENOUS | Status: AC
  Filled 2021-04-05: qty 1

## 2021-04-05 MED ORDER — ONDANSETRON HCL 4 MG/2ML IJ SOLN
INTRAMUSCULAR | Status: AC
  Filled 2021-04-05: qty 2

## 2021-04-05 MED ORDER — FENTANYL CITRATE (PF) 100 MCG/2ML IJ SOLN: 25.0000 ug | INTRAMUSCULAR | Status: DC | PRN

## 2021-04-05 MED ORDER — ACETAMINOPHEN 500 MG PO TABS: 1000.0000 mg | ORAL_TABLET | Freq: Once | ORAL | Status: DC

## 2021-04-05 MED ORDER — BUPIVACAINE HCL (PF) 0.5 % IJ SOLN
INTRAMUSCULAR | Status: DC | PRN
  Administered 2021-04-05: 15 mL via PERINEURAL

## 2021-04-05 MED ORDER — CEFAZOLIN SODIUM-DEXTROSE 2-4 GM/100ML-% IV SOLN
INTRAVENOUS | Status: AC
  Filled 2021-04-05: qty 100

## 2021-04-05 MED ORDER — GABAPENTIN 300 MG PO CAPS
300.0000 mg | ORAL_CAPSULE | Freq: Once | ORAL | Status: AC
  Administered 2021-04-05: 300 mg via ORAL

## 2021-04-05 MED ORDER — ONDANSETRON HCL 4 MG/2ML IJ SOLN
INTRAMUSCULAR | Status: DC | PRN
  Administered 2021-04-05: 4 mg via INTRAVENOUS

## 2021-04-05 MED ORDER — ASPIRIN 81 MG PO CHEW: 81.0000 mg | CHEWABLE_TABLET | Freq: Two times a day (BID) | ORAL | 0 refills | Status: DC

## 2021-04-05 MED ORDER — MIDAZOLAM HCL 2 MG/2ML IJ SOLN
1.0000 mg | Freq: Once | INTRAMUSCULAR | Status: AC
Start: 2021-04-05 — End: 2021-04-05
  Administered 2021-04-05: 1 mg via INTRAVENOUS

## 2021-04-05 MED ORDER — PROMETHAZINE HCL 25 MG/ML IJ SOLN: 6.2500 mg | INTRAMUSCULAR | Status: DC | PRN

## 2021-04-05 MED ORDER — GABAPENTIN 300 MG PO CAPS
ORAL_CAPSULE | ORAL | Status: AC
  Filled 2021-04-05: qty 1

## 2021-04-05 MED ORDER — SODIUM CHLORIDE (PF) 0.9 % IJ SOLN
INTRAMUSCULAR | Status: DC | PRN
  Administered 2021-04-05: 1000 mL

## 2021-04-05 MED ORDER — SUGAMMADEX SODIUM 200 MG/2ML IV SOLN
INTRAVENOUS | Status: DC | PRN
Start: 2021-04-05 — End: 2021-04-05
  Administered 2021-04-05: 200 mg via INTRAVENOUS

## 2021-04-05 MED ORDER — PHENYLEPHRINE 40 MCG/ML (10ML) SYRINGE FOR IV PUSH (FOR BLOOD PRESSURE SUPPORT)
PREFILLED_SYRINGE | INTRAVENOUS | Status: AC
  Filled 2021-04-05: qty 10

## 2021-04-05 MED ORDER — CELECOXIB 200 MG PO CAPS
ORAL_CAPSULE | ORAL | Status: AC
  Filled 2021-04-05: qty 1

## 2021-04-05 MED ORDER — VANCOMYCIN HCL 1000 MG IV SOLR
INTRAVENOUS | Status: DC | PRN
  Administered 2021-04-05: 1000 mg via TOPICAL

## 2021-04-05 MED ORDER — FENTANYL CITRATE (PF) 100 MCG/2ML IJ SOLN
50.0000 ug | Freq: Once | INTRAMUSCULAR | Status: AC
  Administered 2021-04-05: 50 ug via INTRAVENOUS

## 2021-04-05 MED ORDER — FENTANYL CITRATE (PF) 100 MCG/2ML IJ SOLN
INTRAMUSCULAR | Status: DC | PRN
  Administered 2021-04-05: 50 ug via INTRAVENOUS

## 2021-04-05 MED ORDER — ACETAMINOPHEN 500 MG PO TABS
ORAL_TABLET | ORAL | Status: AC
  Filled 2021-04-05: qty 2

## 2021-04-05 MED ORDER — OXYCODONE HCL 5 MG PO TABS: ORAL_TABLET | ORAL | 0 refills | Status: AC

## 2021-04-05 MED ORDER — ACETAMINOPHEN 500 MG PO TABS
1000.0000 mg | ORAL_TABLET | Freq: Once | ORAL | Status: AC
  Administered 2021-04-05: 1000 mg via ORAL

## 2021-04-05 MED ORDER — LACTATED RINGERS IV SOLN: INTRAVENOUS | Status: DC

## 2021-04-05 MED ORDER — PROPOFOL 10 MG/ML IV BOLUS
INTRAVENOUS | Status: DC | PRN
Start: 2021-04-05 — End: 2021-04-05
  Administered 2021-04-05: 150 mg via INTRAVENOUS

## 2021-04-05 MED ORDER — PHENYLEPHRINE HCL-NACL 20-0.9 MG/250ML-% IV SOLN
INTRAVENOUS | Status: DC | PRN
  Administered 2021-04-05: 50 ug/min via INTRAVENOUS

## 2021-04-05 MED ORDER — TRANEXAMIC ACID-NACL 1000-0.7 MG/100ML-% IV SOLN
INTRAVENOUS | Status: AC
  Filled 2021-04-05: qty 100

## 2021-04-05 MED ORDER — AMISULPRIDE (ANTIEMETIC) 5 MG/2ML IV SOLN: 10.0000 mg | Freq: Once | INTRAVENOUS | Status: DC | PRN

## 2021-04-05 MED ORDER — MELOXICAM 15 MG PO TABS: 15.0000 mg | ORAL_TABLET | Freq: Every day | ORAL | 0 refills | Status: DC

## 2021-04-05 MED ORDER — PROPOFOL 10 MG/ML IV BOLUS
INTRAVENOUS | Status: AC
  Filled 2021-04-05: qty 20

## 2021-04-05 MED ORDER — ROCURONIUM BROMIDE 100 MG/10ML IV SOLN
INTRAVENOUS | Status: DC | PRN
  Administered 2021-04-05: 70 mg via INTRAVENOUS

## 2021-04-05 MED ORDER — ALBUMIN HUMAN 5 % IV SOLN: INTRAVENOUS | Status: DC | PRN

## 2021-04-05 MED ORDER — OMEPRAZOLE 20 MG PO CPDR: 20.0000 mg | DELAYED_RELEASE_CAPSULE | Freq: Every day | ORAL | 0 refills | Status: AC

## 2021-04-05 MED ORDER — MIDAZOLAM HCL 2 MG/2ML IJ SOLN: 2.0000 mg | Freq: Once | INTRAMUSCULAR | Status: DC

## 2021-04-05 MED ORDER — ACETAMINOPHEN 500 MG PO TABS: 1000.0000 mg | ORAL_TABLET | Freq: Three times a day (TID) | ORAL | 0 refills | Status: DC

## 2021-04-05 MED ORDER — MIDAZOLAM HCL 2 MG/2ML IJ SOLN
INTRAMUSCULAR | Status: AC
  Filled 2021-04-05: qty 2

## 2021-04-05 MED ORDER — CEFAZOLIN SODIUM-DEXTROSE 2-4 GM/100ML-% IV SOLN
2.0000 g | INTRAVENOUS | Status: AC
  Administered 2021-04-05: 2 g via INTRAVENOUS

## 2021-04-05 SURGICAL SUPPLY — 73 items
AID PSTN UNV HD RSTRNT DISP (MISCELLANEOUS) ×1
APL PRP STRL LF DISP 70% ISPRP (MISCELLANEOUS) ×1
BASEPLATE GLENOID RSA 3X25 0D (Shoulder) ×1 IMPLANT
BIT DRILL 3.2 PERIPHERAL SCREW (BIT) ×1 IMPLANT
BLADE HEX COATED 2.75 (ELECTRODE) IMPLANT
BLADE SAW SAG 73X25 THK (BLADE) ×1
BLADE SAW SGTL 73X25 THK (BLADE) ×1 IMPLANT
BLADE SURG 10 STRL SS (BLADE) IMPLANT
BLADE SURG 15 STRL LF DISP TIS (BLADE) IMPLANT
BLADE SURG 15 STRL SS (BLADE)
BNDG COHESIVE 4X5 TAN ST LF (GAUZE/BANDAGES/DRESSINGS) IMPLANT
BRUSH SCRUB EZ PLAIN DRY (MISCELLANEOUS) ×2 IMPLANT
BSPLAT GLND +3 25 (Shoulder) ×1 IMPLANT
CHLORAPREP W/TINT 26 (MISCELLANEOUS) ×2 IMPLANT
COOLER ICEMAN CLASSIC (MISCELLANEOUS) ×2 IMPLANT
COVER BACK TABLE 60X90IN (DRAPES) ×2 IMPLANT
COVER MAYO STAND STRL (DRAPES) ×2 IMPLANT
DRAPE IMP U-DRAPE 54X76 (DRAPES) ×2 IMPLANT
DRAPE INCISE IOBAN 66X45 STRL (DRAPES) ×2 IMPLANT
DRAPE U-SHAPE 76X120 STRL (DRAPES) ×4 IMPLANT
DRSG AQUACEL AG ADV 3.5X 6 (GAUZE/BANDAGES/DRESSINGS) ×2 IMPLANT
ELECT BLADE 4.0 EZ CLEAN MEGAD (MISCELLANEOUS) ×2
ELECT REM PT RETURN 9FT ADLT (ELECTROSURGICAL) ×2
ELECTRODE BLDE 4.0 EZ CLN MEGD (MISCELLANEOUS) ×1 IMPLANT
ELECTRODE REM PT RTRN 9FT ADLT (ELECTROSURGICAL) ×1 IMPLANT
FACESHIELD WRAPAROUND (MASK) ×4 IMPLANT
FACESHIELD WRAPAROUND OR TEAM (MASK) ×2 IMPLANT
GLENOSPHERE PERFORM 39 3 (Shoulder) ×2 IMPLANT
GLOVE SRG 8 PF TXTR STRL LF DI (GLOVE) ×1 IMPLANT
GLOVE SURG ENC MOIS LTX SZ6.5 (GLOVE) ×4 IMPLANT
GLOVE SURG LTX SZ8 (GLOVE) ×4 IMPLANT
GLOVE SURG UNDER POLY LF SZ6.5 (GLOVE) ×2 IMPLANT
GLOVE SURG UNDER POLY LF SZ8 (GLOVE) ×2
GOWN STRL REUS W/ TWL LRG LVL3 (GOWN DISPOSABLE) ×2 IMPLANT
GOWN STRL REUS W/TWL LRG LVL3 (GOWN DISPOSABLE) ×4
GOWN STRL REUS W/TWL XL LVL3 (GOWN DISPOSABLE) ×2 IMPLANT
GUIDE PIN 3X75 SHOULDER (PIN) ×2
GUIDEWIRE GLENOID 2.5X220 (WIRE) ×1 IMPLANT
HANDPIECE INTERPULSE COAX TIP (DISPOSABLE) ×2
INSERT HUM PERFORM 3/4 39 +6 (Insert) ×1 IMPLANT
KIT SHOULDER STAB MARCO (KITS) ×2 IMPLANT
MANIFOLD NEPTUNE II (INSTRUMENTS) ×2 IMPLANT
PACK BASIN DAY SURGERY FS (CUSTOM PROCEDURE TRAY) ×2 IMPLANT
PACK SHOULDER (CUSTOM PROCEDURE TRAY) ×2 IMPLANT
PAD COLD SHLDR WRAP-ON (PAD) ×2 IMPLANT
PAD ORTHO SHOULDER 7X19 LRG (SOFTGOODS) ×2 IMPLANT
PENCIL SMOKE EVACUATOR (MISCELLANEOUS) IMPLANT
PIN GUIDE 3X75 SHOULDER (PIN) IMPLANT
RESTRAINT HEAD UNIVERSAL NS (MISCELLANEOUS) ×2 IMPLANT
SCREW 5.5X22 (Screw) ×1 IMPLANT
SCREW 5.5X26 (Screw) ×1 IMPLANT
SCREW BONE INTRNL SM 7 (Screw) ×1 IMPLANT
SCREW PERIPHERAL 30 (Screw) ×2 IMPLANT
SET HNDPC FAN SPRY TIP SCT (DISPOSABLE) ×1 IMPLANT
SHEET MEDIUM DRAPE 40X70 STRL (DRAPES) ×2 IMPLANT
SLEEVE SCD COMPRESS KNEE MED (STOCKING) ×2 IMPLANT
SPHERE GLENOD +3X39XLATERALIZE (Shoulder) IMPLANT
SPHR GLND +3X39XLATERALIZE (Shoulder) ×1 IMPLANT
SPIKE FLUID TRANSFER (MISCELLANEOUS) IMPLANT
SPONGE T-LAP 18X18 ~~LOC~~+RFID (SPONGE) ×2 IMPLANT
STEM HUM PERFORM 3+ (Stem) ×1 IMPLANT
STRIP CLOSURE SKIN 1/2X4 (GAUZE/BANDAGES/DRESSINGS) ×2 IMPLANT
SUT ETHIBOND 2 V 37 (SUTURE) ×2 IMPLANT
SUT ETHIBOND NAB CT1 #1 30IN (SUTURE) ×2 IMPLANT
SUT FIBERWIRE #5 38 CONV NDL (SUTURE) ×8
SUT MNCRL AB 4-0 PS2 18 (SUTURE) ×2 IMPLANT
SUT VIC AB 0 CT1 27 (SUTURE)
SUT VIC AB 0 CT1 27XCR 8 STRN (SUTURE) IMPLANT
SUT VIC AB 3-0 SH 27 (SUTURE) ×2
SUT VIC AB 3-0 SH 27X BRD (SUTURE) ×1 IMPLANT
SUTURE FIBERWR #5 38 CONV NDL (SUTURE) ×4 IMPLANT
TOWEL GREEN STERILE FF (TOWEL DISPOSABLE) ×6 IMPLANT
TUBE SUCTION HIGH CAP CLEAR NV (SUCTIONS) ×2 IMPLANT

## 2021-04-05 NOTE — Discharge Instructions (Addendum)
Ophelia Charter MD, MPH Noemi Chapel, PA-C [place] [address] [phone] (tel)   [phone] (fax)   [place] may leave the operative dressing in place until your follow-up appointment. KEEP THE INCISIONS CLEAN AND DRY. There may be a small amount of fluid/bleeding leaking at the surgical site. This is normal after surgery.  If it fills with liquid or blood please call us immediately to change it for you. Use the provided ice machine or Ice packs as often as possible for the first 3-4 days, then as needed for pain relief.   Keep a layer of cloth or a shirt between your skin and the cooling unit to prevent frost bite as it can get very cold.  SHOWERING: - You may shower on Post-Op Day #2.  - The dressing is water resistant but do not scrub it as it may start to peel up.   - You may remove the sling for showering, but keep a water resistant pillow under the arm to keep both the  elbow and shoulder away from the body (mimicking the abduction sling).  - Gently pat the area dry.  - Do not soak the shoulder in water. Do not go swimming in the pool or ocean until your incision has completely healed (about 4-6 weeks after surgery) - KEEP THE INCISIONS CLEAN AND DRY.  EXERCISES Wear the sling at all times You may remove the sling for showering, but keep the arm across the chest or in a secondary sling.    Accidental/Purposeful External Rotation and shoulder flexion (reaching behind you) is to be avoided at all costs for the first month. It is ok to come out of your sling if your are sitting and have assistance for eating.   Do not lift anything heavier than 1 pound until we discuss it further in clinic.   REGIONAL ANESTHESIA (NERVE BLOCKS) The anesthesia team may have performed a nerve block for you if safe in the setting of your care.  This is a great tool used to minimize pain.   Typically the block may start wearing off overnight but the long acting medicine may last for 3-4 days.  The nerve block wearing off can be a challenging period but please utilize your as needed pain medications to try and manage this period.    POST-OP MEDICATIONS- Multimodal approach to pain control In general your pain will be controlled with a combination of substances.  Prescriptions unless otherwise discussed are electronically sent to your pharmacy.  This is a carefully made plan we use to minimize narcotic use.     Meloxicam - Anti-inflammatory medication taken on a scheduled basis Acetaminophen - Non-narcotic pain medicine taken on a scheduled basis  Oxycodone - This is a strong narcotic, to be used only on an as needed basis for SEVERE pain. Aspirin 81mg  - This medicine is used to minimize the risk of blood clots after surgery. Omeprazole - daily medicine to protect your stomach while taking anti-inflammatories.   Zofran -  take as needed for nausea   FOLLOW-UP If you develop a Fever (>101.5), Redness or Drainage from the surgical incision site, please call our office to arrange for an evaluation. Please call the office to schedule a follow-up appointment for a wound check, 7-10 days post-operatively.  IF YOU HAVE ANY QUESTIONS, PLEASE FEEL FREE TO CALL OUR OFFICE.  HELPFUL INFORMATION  If you had a block,  it will wear off between 8-24 hrs postop typically.  This is period when your pain may go from nearly zero to the pain you would have had post-op without the block.  This is an abrupt transition but nothing dangerous is happening.  You may take an extra dose of narcotic when this happens.  Your arm will be in a sling following surgery. You will be in this sling for the next 4 weeks.  I will let you know the exact duration at your follow-up visit.  You may be more comfortable sleeping in a semi-seated position the first few nights following surgery.  Keep a pillow propped under  the elbow and forearm for comfort.  If you have a recliner type of chair it might be beneficial.  If not that is fine too, but it would be helpful to sleep propped up with pillows behind your operated shoulder as well under your elbow and forearm.  This will reduce pulling on the suture lines.  When dressing, put your operative arm in the sleeve first.  When getting undressed, take your operative arm out last.  Loose fitting, button-down shirts are recommended.  In most states it is against the law to drive while your arm is in a sling. And certainly against the law to drive while taking narcotics.  You may return to work/school in the next couple of days when you feel up to it. Desk work and typing in the sling is fine.  We suggest you use the pain medication the first night prior to going to bed, in order to ease any pain when the anesthesia wears off. You should avoid taking pain medications on an empty stomach as it will make you nauseous.  Do not drink alcoholic beverages or take illicit drugs when taking pain medications.  Pain medication may make you constipated.  Below are a few solutions to try in this order: Decrease the amount of pain medication if you arent having pain. Drink lots of decaffeinated fluids. Drink prune juice and/or each dried prunes  If the first 3 dont work start with additional solutions Take Colace - an over-the-counter stool softener Take Senokot - an over-the-counter laxative Take Miralax - a stronger over-the-counter laxative   Dental Antibiotics:  In most cases prophylactic antibiotics for Dental procdeures after total joint surgery are not necessary.  Exceptions are as follows:  1. History of prior total joint infection  2. Severely immunocompromised (Organ Transplant, cancer chemotherapy, Rheumatoid biologic meds such as [place])  3. Poorly controlled diabetes (A1C &gt; 8.0, blood glucose over 200)  If you have one of these conditions, contact  your surgeon for an antibiotic prescription, prior to your dental procedure.   For more information including helpful videos and documents visit our website:   [email or web address]        Post Anesthesia Home Care Instructions  Activity: Get plenty of rest for the remainder of the day. A responsible individual must stay with you for 24 hours following the procedure.  For the next 24 hours, DO NOT: -Drive a car -Paediatric nurse -Drink alcoholic beverages -Take any medication unless instructed by your physician -Make any legal decisions or sign important papers.  Meals: Start with liquid foods such as gelatin or soup. Progress to regular foods as tolerated. Avoid greasy, spicy, heavy foods. If nausea and/or vomiting occur, drink only clear liquids until the nausea and/or vomiting subsides. Call your physician if vomiting continues.  Special Instructions/Symptoms: Your throat may feel dry or sore  from the anesthesia or the breathing tube placed in your throat during surgery. If this causes discomfort, gargle with warm salt water. The discomfort should disappear within 24 hours.  If you had a scopolamine patch placed behind your ear for the management of post- operative nausea and/or vomiting:  1. The medication in the patch is effective for 72 hours, after which it should be removed.  Wrap patch in a tissue and discard in the trash. Wash hands thoroughly with soap and water. 2. You may remove the patch earlier than 72 hours if you experience unpleasant side effects which may include dry mouth, dizziness or visual disturbances. 3. Avoid touching the patch. Wash your hands with soap and water after contact with the patch.        Regional Anesthesia Blocks  1. Numbness or the inability to move the "blocked" extremity may last from 3-48 hours after placement. The length of time depends on the medication injected and your individual response to the  medication. If the numbness is not going away after 48 hours, call your surgeon.  2. The extremity that is blocked will need to be protected until the numbness is gone and the  Strength has returned. Because you cannot feel it, you will need to take extra care to avoid injury. Because it may be weak, you may have difficulty moving it or using it. You may not know what position it is in without looking at it while the block is in effect.  3. For blocks in the legs and feet, returning to weight bearing and walking needs to be done carefully. You will need to wait until the numbness is entirely gone and the strength has returned. You should be able to move your leg and foot normally before you try and bear weight or walk. You will need someone to be with you when you first try to ensure you do not fall and possibly risk injury.  4. Bruising and tenderness at the needle site are common side effects and will resolve in a few days.  5. Persistent numbness or new problems with movement should be communicated to the surgeon or the [facility] [phone]).      Information for Discharge Teaching: EXPAREL (bupivacaine liposome injectable suspension)   Your surgeon or anesthesiologist gave you EXPAREL(bupivacaine) to help control your pain after surgery.  EXPAREL is a local anesthetic that provides pain relief by numbing the tissue around the surgical site. EXPAREL is designed to release pain medication over time and can control pain for up to 72 hours. Depending on how you respond to EXPAREL, you may require less pain medication during your recovery.  Possible side effects: Temporary loss of sensation or ability to move in the area where bupivacaine was injected. Nausea, vomiting, constipation Rarely, numbness and tingling in your mouth or lips, lightheadedness, or anxiety may occur. Call your doctor right away if you think you may be  experiencing any of these sensations, or if you have other questions regarding possible side effects.  Follow all other discharge instructions given to you by your surgeon or nurse. Eat a healthy diet and drink plenty of water or other fluids.  If you return to the hospital for any reason within 96 hours following the administration of EXPAREL, it is important for health care providers to know that you have received this anesthetic. A teal colored band has been placed on your arm with the date, time and amount  of EXPAREL you have received in order to alert and inform your health care providers. Please leave this armband in place for the full 96 hours following administration, and then you may remove the band.       Next dose of Tylenol can be given after 12:48 PM. Next dose of NSAID (Ibuprofen, Motrin, Aleve) can be given after 12:48 PM.

## 2021-04-05 NOTE — Op Note (Signed)
Orthopaedic Surgery Operative Note (CSN: [id number])  Kyle [name]  1945/03/19 Date of Surgery: 04/05/2021   Diagnoses:  Right shoulder irreparable rotator cuff tear  Procedure: Right reverse lateralized total Shoulder Arthroplasty   Operative Finding Successful completion of planned procedure.  Patient's bone quality was relatively good and his fit on his humeral side was such that we actually downsized from our plan.  His cortical bone was similarly very robust in the scapula and it was difficult to get coaxial reaming of the central boss of the baseplate and the screw fixation.  Thus we used a press-fit post.  We good fixation at the end with both implants.  Good stability.  Actually nerve tug test was intact.  Case was somewhat atypical because of the cortical fit however otherwise things went well.  Post-operative plan: The patient will be NWB in sling.  The patient will be will be discharged from PACU if continues to be stable as was plan prior to surgery.  DVT prophylaxis Aspirin 81 mg twice daily for 6 weeks.  Pain control with PRN pain medication preferring oral medicines.  Follow up plan will be scheduled in approximately 7 days for incision check and XR.  Physical therapy to start as soon as possible.  Implants: Tornier perform humeral size 3+ humeral stem, +6 polyethylene, 39+3 glenosphere, 25+3 baseplate with a short center post and 4 peripheral screws  Post-Op Diagnosis: Same Surgeons:Primary: Hiram Gash, MD Assistants:Caroline McBane PA-C Location: [facility] OR ROOM 6 Anesthesia: General with Exparel Interscalene Antibiotics: Ancef 2g preop, Vancomycin 1000mg  locally Tourniquet time: None Estimated Blood Loss: 932 Complications: None Specimens: None Implants: Implant Name Type Inv. Item Serial No. Manufacturer Lot No. LRB No. Used Action  BASEPLATE GLENOID RSA [id number] 0D - [id number] Shoulder BASEPLATE GLENOID RSA 3U20 0D [id number] [employer]  Right 1 Implanted  SCREW BONE  INTRNL SM 7 - [id number] Screw SCREW BONE INTRNL SM 7 [id number] [employer]  Right 1 Implanted  SCREW PERIPHERAL 30 - [id number] Screw SCREW PERIPHERAL 30  [employer]  Right 2 Implanted  SCREW 5.5X22 - [id number] Screw SCREW 5.5X22  [employer]  Right 1 Implanted  SCREW 5.5X26 - [id number] Screw SCREW 5.5X26  [employer]  Right 1 Implanted  GLENOSPHERE PERFORM 39 3 - [id number] Shoulder GLENOSPHERE PERFORM 39 3 [id number] [employer]  Right 1 Implanted  tornier perform humeral system Orthopedic Implant  [id number] [employer]  Right 1 Implanted  STEM HUM PERFORM 3+ - [id number] Stem STEM HUM PERFORM 3+ [id number] [employer]  Right 1 Implanted    Indications for Surgery:   Kyle [name] is a 76 y.o. male with irreparable rotator cuff tear.  Benefits and risks of operative and nonoperative management were discussed prior to surgery with patient/guardian(s) and informed consent form was completed.  Infection and need for further surgery were discussed as was prosthetic stability and cuff issues.  We additionally specifically discussed risks of axillary nerve injury, infection, periprosthetic fracture, continued pain and longevity of implants prior to beginning procedure.      Procedure:   The patient was identified in the preoperative holding area where the surgical site was marked. Block placed by anesthesia with exparel.  The patient was taken to the OR where a procedural timeout was called and the above noted anesthesia was induced.  The patient was positioned beachchair on allen table with spider arm positioner.  Preoperative antibiotics were dosed.  The patient's right shoulder was prepped and draped in the usual sterile  fashion.  A second preoperative timeout was called.       Standard deltopectoral approach was performed with a #10 blade. We dissected down to the subcutaneous tissues and the cephalic vein was taken laterally with the deltoid. Clavipectoral fascia was incised in line with  the incision. Deep retractors were placed. The long of the biceps tendon was identified and there was significant tenosynovitis present.  Tenodesis was performed to the pectoralis tendon with #2 Ethibond. The remaining biceps was followed up into the rotator interval where it was released.   The subscapularis was taken down in a full thickness layer with capsule along the humeral neck extending inferiorly around the humeral head. We continued releasing the capsule directly off of the osteophytes inferiorly all the way around the corner. This allowed [place] to dislocate the humeral head.   The rotator cuff was carefully examined and noted to be irreperably torn.  The decision was confirmed that a reverse total shoulder was indicated for this patient.  There were osteophytes along the inferior humeral neck. The osteophytes were removed with an osteotome and a rongeur.  Osteophytes were removed with a rongeur and an osteotome and the anatomic neck was well visualized.     A humeral cutting guide was used extra medullary with a pin to help control version. The version was set at 20 of retroversion. Humeral osteotomy was performed with an oscillating saw. The head fragment was passed off the back table.  A cut protector plate was placed.  The subscapularis was again identified and immediately we took care to palpate the axillary nerve anteriorly and verify its position with gentle palpation as well as the tug test.  We then released the SGHL with bovie cautery prior to placing a curved mayo at the junction of the anterior glenoid well above the axillary nerve and bluntly dissecting the subscapularis from the capsule.  We then carefully protected the axillary nerve as we gently released the inferior capsule to fully mobilize the subscapularis.  An anterior deltoid retractor was then placed as well as a small Hohmann retractor superiorly.   The glenoid was relatively intact in the setting of an irreparable cuff  tear  The remaining labrum was removed circumferentially taking great care not to disrupt the posterior capsule.   The glenoid drill guide was placed and used to drill a guide pin in the center, inferior position. The glenoid face was then reamed concentrically over the guide wire. The center hole was drilled over the guidepin in a near anatomic angle of version. Next the glenoid vault was drilled back to a depth of 40 mm.  We tapped and then placed a 34mm size baseplate with additional 76mm lateralization.  Though we attempted to use a screw it did not coaxially fit with our center boss drill and there are vault was intact this was creating and in appropriate alignment.  We thus changed to a short post and had good fixation after malleting this into place.  We then placed 4 peripheral locking screws and nonlocking screws.  Next a 39 +3 mm glenosphere was selected and impacted onto the baseplate. The center screw was tightened.  We turned attention back to the humeral side. The cut protector was removed.  We used the perform humeral sizing block to select the appropriate size which for this patient was a 4.  We then placed our center pin and reamed over it concentrically obtaining appropriate inset.  We then used our lateralizing chisel to prepare  the lateral aspect of the humerus.  At that point we selected the appropriate implant trialing a 3 plus.  His cortical and cancellous bone was so robust that the 4 implant had a high likelihood of fracture.  Using this trial implant we trialed multiple polyethylene sizes settling on a 6 which provided good stability and range of motion without excess soft tissue tension. The offset was dialed in to match the normal anatomy. The shoulder was trialed.  There was good ROM in all planes and the shoulder was stable with no inferior translation.  The real humeral implants were opened after again confirming sizes.  The trial was removed. #5 Fiberwire x4 sutures passed  through the humeral neck for subscap repair. The humeral component was press-fit obtaining a secure fit. The joint was reduced and thoroughly irrigated with pulsatile lavage. Subscap was repaired back with #5 Fiberwire sutures through bone tunnels. Hemostasis was obtained. The deltopectoral interval was reapproximated with #1 Ethibond. The subcutaneous tissues were closed with 2-0 Vicryl and the skin was closed with running monocryl.    The wounds were cleaned and dried and an Aquacel dressing was placed. The drapes taken down. The arm was placed into sling with abduction pillow. Patient was awakened, extubated, and transferred to the recovery room in stable condition. There were no intraoperative complications. The sponge, needle, and attention counts were  correct at the end of the case.     Noemi Chapel, PA-C, present and scrubbed throughout the case, critical for completion in a timely fashion, and for retraction, instrumentation, closure.

## 2021-04-05 NOTE — Progress Notes (Signed)
Assisted Dr. Singer with right, ultrasound guided, interscalene  block. Side rails up, monitors on throughout procedure. See vital signs in flow sheet. Tolerated Procedure well. 

## 2021-04-05 NOTE — Interval H&P Note (Signed)
All questions answered, patient wants to proceed with procedure. ? ?

## 2021-04-05 NOTE — Transfer of Care (Signed)
Immediate Anesthesia Transfer of Care Note  Patient: Kyle [name]  Procedure(s) Performed: REVERSE SHOULDER ARTHROPLASTY (Right: Shoulder)  Patient Location: PACU  Anesthesia Type:General  Level of Consciousness: drowsy, patient cooperative and responds to stimulation  Airway & Oxygen Therapy: Patient Spontanous Breathing and Patient connected to face mask oxygen  Post-op Assessment: Report given to RN and Post -op Vital signs reviewed and stable  Post vital signs: Reviewed and stable  Last Vitals:  Vitals Value Taken Time  BP    Temp    Pulse 71 04/05/21 0906  Resp    SpO2 99 % 04/05/21 0906  Vitals shown include unvalidated device data.  Last Pain:  Vitals:   04/05/21 0644  TempSrc: Oral  PainSc: 0-No pain         Complications: No notable events documented.

## 2021-04-05 NOTE — Anesthesia Postprocedure Evaluation (Signed)
Anesthesia Post Note  Patient: Kyle [name]  Procedure(s) Performed: REVERSE SHOULDER ARTHROPLASTY (Right: Shoulder)     Patient location during evaluation: PACU Anesthesia Type: General Level of consciousness: sedated Pain management: pain level controlled Vital Signs Assessment: post-procedure vital signs reviewed and stable Respiratory status: spontaneous breathing and respiratory function stable Cardiovascular status: stable Postop Assessment: no apparent nausea or vomiting Anesthetic complications: no   No notable events documented.  Last Vitals:  Vitals:   04/05/21 0930 04/05/21 0943  BP: 107/73 115/72  Pulse: 74 75  Resp: 12 (!) 8  Temp:    SpO2: 99% 95%    Last Pain:  Vitals:   04/05/21 0930  TempSrc:   PainSc: 0-No pain                 Kyle [name]

## 2021-04-05 NOTE — Anesthesia Procedure Notes (Signed)
Procedure Name: Intubation Date/Time: 04/05/2021 7:32 AM Performed by: Glory Buff, CRNA Pre-anesthesia Checklist: Patient identified, Emergency Drugs available, Suction available and Patient being monitored Patient Re-evaluated:Patient Re-evaluated prior to induction Oxygen Delivery Method: Circle system utilized Preoxygenation: Pre-oxygenation with 100% oxygen Induction Type: IV induction Ventilation: Mask ventilation without difficulty Laryngoscope Size: Miller and 3 Grade View: Grade I Tube type: Oral Tube size: 7.5 mm Number of attempts: 1 Airway Equipment and Method: Stylet and Oral airway Placement Confirmation: ETT inserted through vocal cords under direct vision, positive ETCO2 and breath sounds checked- equal and bilateral Secured at: 22 cm Tube secured with: Tape Dental Injury: Teeth and Oropharynx as per pre-operative assessment

## 2021-04-05 NOTE — Anesthesia Procedure Notes (Signed)
Anesthesia Regional Block: Interscalene brachial plexus block   Pre-Anesthetic Checklist: , timeout performed,  Correct Patient, Correct Site, Correct Laterality,  Correct Procedure, Correct Position, site marked,  Risks and benefits discussed,  Surgical consent,  Pre-op evaluation,  At surgeon's request and post-op pain management  Laterality: Right  Prep: chloraprep       Needles:  Injection technique: Single-shot  Needle Type: Echogenic Stimulator Needle     Needle Length: 5cm  Needle Gauge: 22     Additional Needles:   Narrative:  Start time: 04/05/2021 7:06 AM End time: 04/05/2021 7:16 AM Injection made incrementally with aspirations every 5 mL.  Performed by: Personally  Anesthesiologist: Duane Boston, MD  Additional Notes: Functioning IV was confirmed and monitors applied.  A 75mm 22ga echogenic arrow stimulator was used. Sterile prep and drape,hand hygiene and sterile gloves were used.Ultrasound guidance: relevant anatomy identified, needle position confirmed, local anesthetic spread visualized around nerve(s)., vascular puncture avoided.  Image printed for medical record.  Negative aspiration and negative test dose prior to incremental administration of local anesthetic. The patient tolerated the procedure well.

## 2021-04-06 ENCOUNTER — Encounter (HOSPITAL_BASED_OUTPATIENT_CLINIC_OR_DEPARTMENT_OTHER): Payer: Self-pay | Admitting: Orthopaedic Surgery

## 2021-04-11 ENCOUNTER — Ambulatory Visit (INDEPENDENT_AMBULATORY_CARE_PROVIDER_SITE_OTHER): Payer: No Typology Code available for payment source | Admitting: Family Medicine

## 2021-04-11 ENCOUNTER — Other Ambulatory Visit: Payer: Self-pay

## 2021-04-11 ENCOUNTER — Encounter: Payer: Self-pay | Admitting: Family Medicine

## 2021-04-11 VITALS — BP 136/78 | HR 82 | Temp 98.0°F | Ht 68.0 in | Wt 179.6 lb

## 2021-04-11 DIAGNOSIS — Z0000 Encounter for general adult medical examination without abnormal findings: Secondary | ICD-10-CM
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
## 2021-04-11 NOTE — Patient Instructions (Addendum)
CONSTIPATION Difficult, uncomfortable, infrequent BM  1. Warm prune juice, hot water, tea, coffee, apple juice 2. Prevention: drink 8 glasses water daily, FIBER (raw fruit, veggies, bran cereal, whole grains), regular exercise 3. Bulk formers like Metamucil (psyllium), Citrucel (methylcellulose) usually help 4. Stool softeners (Docusate) occaisionally OK - ok to use anywhere from once a day to 2 tablets twice a day - do this first.  5. Occaisional over the counter Miralax usually safe 6. Overuse of stimulant  laxatives (Ex-Lax, Mag Citrate, Dulcolax, etc.) can be habit forming     Check with your insurance to see if they will cover the shingles shot.  The newer [facility] shot is much better than the older shot. The [facility] shot requires 2 shots given 6 months apart.  Almost always, this shot is not covered in our office by your insurance. It costs 500 dollars, but essentially all insurances cover it at your pharmacy.

## 2021-04-13 DIAGNOSIS — M19011 Primary osteoarthritis, right shoulder: Secondary | ICD-10-CM | POA: Diagnosis not present

## 2021-04-13 DIAGNOSIS — M25512 Pain in left shoulder: Secondary | ICD-10-CM | POA: Diagnosis not present

## 2021-04-19 ENCOUNTER — Other Ambulatory Visit: Payer: Self-pay

## 2021-04-19 ENCOUNTER — Ambulatory Visit: Payer: No Typology Code available for payment source | Attending: Orthopaedic Surgery | Admitting: Physical Therapy

## 2021-04-19 ENCOUNTER — Encounter: Payer: Self-pay | Admitting: Physical Therapy

## 2021-04-19 DIAGNOSIS — M25611 Stiffness of right shoulder, not elsewhere classified: Secondary | ICD-10-CM | POA: Insufficient documentation

## 2021-04-19 DIAGNOSIS — M25511 Pain in right shoulder: Secondary | ICD-10-CM | POA: Insufficient documentation

## 2021-04-19 DIAGNOSIS — M6281 Muscle weakness (generalized): Secondary | ICD-10-CM | POA: Diagnosis not present

## 2021-04-19 DIAGNOSIS — R600 Localized edema: Secondary | ICD-10-CM | POA: Diagnosis not present

## 2021-04-19 NOTE — Addendum Note (Signed)
Addended by: Mathis Dad on: 04/19/2021 02:24 PM   Modules accepted: Orders
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
## 2021-04-24 ENCOUNTER — Other Ambulatory Visit: Payer: Self-pay

## 2021-04-24 ENCOUNTER — Ambulatory Visit: Payer: No Typology Code available for payment source

## 2021-04-24 DIAGNOSIS — M6281 Muscle weakness (generalized): Secondary | ICD-10-CM | POA: Diagnosis not present

## 2021-04-24 DIAGNOSIS — M25611 Stiffness of right shoulder, not elsewhere classified: Secondary | ICD-10-CM

## 2021-04-24 DIAGNOSIS — R600 Localized edema: Secondary | ICD-10-CM

## 2021-04-24 DIAGNOSIS — M25511 Pain in right shoulder: Secondary | ICD-10-CM
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
## 2021-04-26 ENCOUNTER — Ambulatory Visit: Payer: No Typology Code available for payment source | Attending: Orthopaedic Surgery | Admitting: Physical Therapy

## 2021-04-26 ENCOUNTER — Encounter: Payer: Self-pay | Admitting: Physical Therapy

## 2021-04-26 ENCOUNTER — Other Ambulatory Visit: Payer: Self-pay

## 2021-04-26 DIAGNOSIS — M25611 Stiffness of right shoulder, not elsewhere classified: Secondary | ICD-10-CM | POA: Diagnosis not present

## 2021-04-26 DIAGNOSIS — M6281 Muscle weakness (generalized): Secondary | ICD-10-CM | POA: Insufficient documentation

## 2021-04-26 DIAGNOSIS — M25511 Pain in right shoulder: Secondary | ICD-10-CM | POA: Insufficient documentation

## 2021-04-26 DIAGNOSIS — R600 Localized edema: Secondary | ICD-10-CM | POA: Diagnosis not present
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
## 2021-05-01 ENCOUNTER — Other Ambulatory Visit: Payer: Self-pay

## 2021-05-01 ENCOUNTER — Ambulatory Visit: Payer: No Typology Code available for payment source

## 2021-05-01 DIAGNOSIS — M6281 Muscle weakness (generalized): Secondary | ICD-10-CM | POA: Diagnosis not present

## 2021-05-01 DIAGNOSIS — Z96651 Presence of right artificial knee joint: Secondary | ICD-10-CM | POA: Diagnosis not present

## 2021-05-01 DIAGNOSIS — M25511 Pain in right shoulder: Secondary | ICD-10-CM

## 2021-05-01 DIAGNOSIS — R600 Localized edema: Secondary | ICD-10-CM

## 2021-05-01 DIAGNOSIS — M25611 Stiffness of right shoulder, not elsewhere classified: Secondary | ICD-10-CM
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
## 2021-05-03 ENCOUNTER — Other Ambulatory Visit: Payer: Self-pay

## 2021-05-03 ENCOUNTER — Encounter: Payer: Self-pay | Admitting: Physical Therapy

## 2021-05-03 ENCOUNTER — Ambulatory Visit: Payer: No Typology Code available for payment source | Admitting: Physical Therapy

## 2021-05-03 DIAGNOSIS — M25611 Stiffness of right shoulder, not elsewhere classified: Secondary | ICD-10-CM

## 2021-05-03 DIAGNOSIS — M6281 Muscle weakness (generalized): Secondary | ICD-10-CM | POA: Diagnosis not present

## 2021-05-03 DIAGNOSIS — R600 Localized edema: Secondary | ICD-10-CM

## 2021-05-03 DIAGNOSIS — M25511 Pain in right shoulder: Secondary | ICD-10-CM
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
## 2021-05-04 DIAGNOSIS — M19011 Primary osteoarthritis, right shoulder: Secondary | ICD-10-CM | POA: Diagnosis not present

## 2021-05-08 ENCOUNTER — Other Ambulatory Visit: Payer: Self-pay

## 2021-05-08 ENCOUNTER — Ambulatory Visit: Payer: No Typology Code available for payment source | Admitting: Physical Therapy

## 2021-05-08 ENCOUNTER — Encounter: Payer: Self-pay | Admitting: Physical Therapy

## 2021-05-08 DIAGNOSIS — M25511 Pain in right shoulder: Secondary | ICD-10-CM

## 2021-05-08 DIAGNOSIS — M6281 Muscle weakness (generalized): Secondary | ICD-10-CM | POA: Diagnosis not present

## 2021-05-08 DIAGNOSIS — M25611 Stiffness of right shoulder, not elsewhere classified: Secondary | ICD-10-CM

## 2021-05-08 DIAGNOSIS — R600 Localized edema: Secondary | ICD-10-CM
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
## 2021-05-10 ENCOUNTER — Other Ambulatory Visit: Payer: Self-pay

## 2021-05-10 ENCOUNTER — Ambulatory Visit: Payer: No Typology Code available for payment source | Admitting: Physical Therapy

## 2021-05-10 ENCOUNTER — Encounter: Payer: Self-pay | Admitting: Physical Therapy

## 2021-05-10 ENCOUNTER — Telehealth: Payer: Self-pay | Admitting: Family Medicine

## 2021-05-10 DIAGNOSIS — M25511 Pain in right shoulder: Secondary | ICD-10-CM

## 2021-05-10 DIAGNOSIS — M6281 Muscle weakness (generalized): Secondary | ICD-10-CM

## 2021-05-10 DIAGNOSIS — M25611 Stiffness of right shoulder, not elsewhere classified: Secondary | ICD-10-CM

## 2021-05-10 DIAGNOSIS — R600 Localized edema: Secondary | ICD-10-CM
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
## 2021-05-10 NOTE — Telephone Encounter (Signed)
I printed out 2 separate documents from within [place] for the pt per my supervisor and the two dates are 3.9.23 & 3.16.23 and gave to pt ?
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
## 2021-05-15 ENCOUNTER — Other Ambulatory Visit: Payer: Self-pay

## 2021-05-15 ENCOUNTER — Ambulatory Visit: Payer: No Typology Code available for payment source

## 2021-05-15 DIAGNOSIS — M25511 Pain in right shoulder: Secondary | ICD-10-CM

## 2021-05-15 DIAGNOSIS — M6281 Muscle weakness (generalized): Secondary | ICD-10-CM | POA: Diagnosis not present

## 2021-05-15 DIAGNOSIS — R600 Localized edema: Secondary | ICD-10-CM

## 2021-05-15 DIAGNOSIS — M25611 Stiffness of right shoulder, not elsewhere classified: Secondary | ICD-10-CM

## 2021-05-17 ENCOUNTER — Encounter: Payer: Self-pay | Admitting: Physical Therapy

## 2021-05-17 ENCOUNTER — Ambulatory Visit: Payer: No Typology Code available for payment source | Admitting: Physical Therapy

## 2021-05-17 ENCOUNTER — Other Ambulatory Visit: Payer: Self-pay

## 2021-05-17 DIAGNOSIS — M25511 Pain in right shoulder: Secondary | ICD-10-CM

## 2021-05-17 DIAGNOSIS — R600 Localized edema: Secondary | ICD-10-CM

## 2021-05-17 DIAGNOSIS — M6281 Muscle weakness (generalized): Secondary | ICD-10-CM | POA: Diagnosis not present

## 2021-05-17 DIAGNOSIS — M25611 Stiffness of right shoulder, not elsewhere classified: Secondary | ICD-10-CM
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
## 2021-05-22 ENCOUNTER — Ambulatory Visit: Payer: No Typology Code available for payment source

## 2021-05-22 ENCOUNTER — Other Ambulatory Visit: Payer: Self-pay

## 2021-05-22 DIAGNOSIS — R600 Localized edema: Secondary | ICD-10-CM

## 2021-05-22 DIAGNOSIS — M25611 Stiffness of right shoulder, not elsewhere classified: Secondary | ICD-10-CM

## 2021-05-22 DIAGNOSIS — M6281 Muscle weakness (generalized): Secondary | ICD-10-CM

## 2021-05-22 DIAGNOSIS — M25511 Pain in right shoulder: Secondary | ICD-10-CM

## 2021-05-24 ENCOUNTER — Encounter: Payer: Self-pay | Admitting: Physical Therapy

## 2021-05-24 ENCOUNTER — Ambulatory Visit: Payer: No Typology Code available for payment source | Admitting: Physical Therapy

## 2021-05-24 DIAGNOSIS — M6281 Muscle weakness (generalized): Secondary | ICD-10-CM | POA: Diagnosis not present

## 2021-05-24 DIAGNOSIS — M25611 Stiffness of right shoulder, not elsewhere classified: Secondary | ICD-10-CM

## 2021-05-24 DIAGNOSIS — M25511 Pain in right shoulder: Secondary | ICD-10-CM
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
## 2021-05-29 ENCOUNTER — Ambulatory Visit: Payer: No Typology Code available for payment source | Attending: Orthopaedic Surgery | Admitting: Physical Therapy

## 2021-05-29 ENCOUNTER — Encounter: Payer: Self-pay | Admitting: Physical Therapy

## 2021-05-29 DIAGNOSIS — R600 Localized edema: Secondary | ICD-10-CM | POA: Diagnosis not present

## 2021-05-29 DIAGNOSIS — M25611 Stiffness of right shoulder, not elsewhere classified: Secondary | ICD-10-CM | POA: Insufficient documentation

## 2021-05-29 DIAGNOSIS — M25511 Pain in right shoulder: Secondary | ICD-10-CM | POA: Diagnosis not present

## 2021-05-29 DIAGNOSIS — M6281 Muscle weakness (generalized): Secondary | ICD-10-CM | POA: Diagnosis not present
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
## 2021-05-31 ENCOUNTER — Ambulatory Visit: Payer: No Typology Code available for payment source | Admitting: Physical Therapy

## 2021-05-31 ENCOUNTER — Encounter: Payer: Self-pay | Admitting: Physical Therapy

## 2021-05-31 DIAGNOSIS — M6281 Muscle weakness (generalized): Secondary | ICD-10-CM | POA: Diagnosis not present

## 2021-05-31 DIAGNOSIS — M25611 Stiffness of right shoulder, not elsewhere classified: Secondary | ICD-10-CM

## 2021-05-31 DIAGNOSIS — R600 Localized edema: Secondary | ICD-10-CM

## 2021-05-31 DIAGNOSIS — M25511 Pain in right shoulder: Secondary | ICD-10-CM
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
## 2021-06-04 DIAGNOSIS — H524 Presbyopia: Secondary | ICD-10-CM | POA: Diagnosis not present

## 2021-06-05 ENCOUNTER — Encounter: Payer: Self-pay | Admitting: Physical Therapy

## 2021-06-05 ENCOUNTER — Ambulatory Visit: Payer: No Typology Code available for payment source | Admitting: Physical Therapy

## 2021-06-05 DIAGNOSIS — R600 Localized edema: Secondary | ICD-10-CM

## 2021-06-05 DIAGNOSIS — M25511 Pain in right shoulder: Secondary | ICD-10-CM

## 2021-06-05 DIAGNOSIS — M6281 Muscle weakness (generalized): Secondary | ICD-10-CM | POA: Diagnosis not present

## 2021-06-05 DIAGNOSIS — M25611 Stiffness of right shoulder, not elsewhere classified: Secondary | ICD-10-CM
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
## 2021-06-07 ENCOUNTER — Ambulatory Visit: Payer: No Typology Code available for payment source | Admitting: Physical Therapy

## 2021-06-07 ENCOUNTER — Encounter: Payer: Self-pay | Admitting: Physical Therapy

## 2021-06-07 DIAGNOSIS — M25511 Pain in right shoulder: Secondary | ICD-10-CM

## 2021-06-07 DIAGNOSIS — R600 Localized edema: Secondary | ICD-10-CM

## 2021-06-07 DIAGNOSIS — M6281 Muscle weakness (generalized): Secondary | ICD-10-CM | POA: Diagnosis not present

## 2021-06-07 DIAGNOSIS — M25611 Stiffness of right shoulder, not elsewhere classified: Secondary | ICD-10-CM
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
## 2021-06-12 ENCOUNTER — Encounter: Payer: Self-pay | Admitting: Physical Therapy

## 2021-06-12 ENCOUNTER — Ambulatory Visit: Payer: No Typology Code available for payment source | Admitting: Physical Therapy

## 2021-06-12 DIAGNOSIS — R600 Localized edema: Secondary | ICD-10-CM

## 2021-06-12 DIAGNOSIS — M25511 Pain in right shoulder: Secondary | ICD-10-CM

## 2021-06-12 DIAGNOSIS — M6281 Muscle weakness (generalized): Secondary | ICD-10-CM

## 2021-06-12 DIAGNOSIS — M25611 Stiffness of right shoulder, not elsewhere classified: Secondary | ICD-10-CM
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
## 2021-06-14 ENCOUNTER — Encounter: Payer: Self-pay | Admitting: Physical Therapy

## 2021-06-14 ENCOUNTER — Ambulatory Visit: Payer: No Typology Code available for payment source | Admitting: Physical Therapy

## 2021-06-14 DIAGNOSIS — M6281 Muscle weakness (generalized): Secondary | ICD-10-CM

## 2021-06-14 DIAGNOSIS — R600 Localized edema: Secondary | ICD-10-CM

## 2021-06-14 DIAGNOSIS — M25611 Stiffness of right shoulder, not elsewhere classified: Secondary | ICD-10-CM

## 2021-06-14 DIAGNOSIS — M25511 Pain in right shoulder: Secondary | ICD-10-CM
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
## 2021-06-19 ENCOUNTER — Encounter: Payer: Self-pay | Admitting: Physical Therapy

## 2021-06-19 ENCOUNTER — Ambulatory Visit: Payer: No Typology Code available for payment source | Admitting: Physical Therapy

## 2021-06-19 DIAGNOSIS — M25611 Stiffness of right shoulder, not elsewhere classified: Secondary | ICD-10-CM

## 2021-06-19 DIAGNOSIS — M6281 Muscle weakness (generalized): Secondary | ICD-10-CM

## 2021-06-19 DIAGNOSIS — R600 Localized edema: Secondary | ICD-10-CM

## 2021-06-19 DIAGNOSIS — M25511 Pain in right shoulder: Secondary | ICD-10-CM
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
## 2021-06-21 ENCOUNTER — Encounter: Payer: Self-pay | Admitting: Physical Therapy

## 2021-06-21 ENCOUNTER — Ambulatory Visit: Payer: No Typology Code available for payment source | Admitting: Physical Therapy

## 2021-06-21 DIAGNOSIS — R600 Localized edema: Secondary | ICD-10-CM

## 2021-06-21 DIAGNOSIS — M6281 Muscle weakness (generalized): Secondary | ICD-10-CM

## 2021-06-21 DIAGNOSIS — M25511 Pain in right shoulder: Secondary | ICD-10-CM

## 2021-06-21 DIAGNOSIS — M25611 Stiffness of right shoulder, not elsewhere classified: Secondary | ICD-10-CM
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
## 2021-06-26 ENCOUNTER — Ambulatory Visit: Payer: No Typology Code available for payment source | Admitting: Physical Therapy

## 2021-06-28 ENCOUNTER — Ambulatory Visit: Payer: No Typology Code available for payment source

## 2021-06-28 ENCOUNTER — Telehealth: Payer: Self-pay | Admitting: Family Medicine

## 2021-06-28 NOTE — Telephone Encounter (Signed)
He is up to date - nothing additional needed. ? ?Tdap is up to date ?Bivalent covid done 11/2020 ? ?It looks like he needs Shingrix #2 ? ?Immunization History  ?Administered Date(s) Administered  ? Fluad Quad(high Dose 65+) 10/27/2018, 11/10/2019  ? Influenza Split 12/17/2010  ? Influenza Whole 12/10/2007  ? Influenza, High Dose Seasonal PF 11/22/2020  ? Influenza,inj,Quad PF,6+ Mos 02/03/2013, 02/23/2014, 12/29/2014, 12/19/2015, 12/10/2016, 12/10/2016, 11/24/2017  ? PFIZER(Purple Top)SARS-COV-2 Vaccination 04/01/2019, 04/26/2019, 11/22/2019, 07/03/2020  ? Pension scheme manager 33yr & up 12/07/2020  ? Pneumococcal Conjugate-13 02/16/2015  ? Pneumococcal Polysaccharide-23 02/10/2013  ? Tdap 09/19/2010, 02/09/2014  ? Zoster Recombinat (Shingrix) 03/13/2021  ? Zoster, Live 12/28/2010  ?  ?Health Maintenance  ?Topic Date Due  ? Zoster Vaccines- Shingrix (2 of 2) 05/08/2021  ? INFLUENZA VACCINE  09/25/2021  ? TETANUS/TDAP  02/10/2024  ? Pneumonia Vaccine 75 Years old  Completed  ? COVID-19 Vaccine  Completed  ? Hepatitis C Screening  Completed  ? HPV VACCINES  Aged Out  ? COLONOSCOPY (Pts 45-466yrInsurance coverage will need to be confirmed)  Discontinued  ?  ? ?

## 2021-06-28 NOTE — Telephone Encounter (Signed)
Kyle [name] notified as instructed by telephone.  Patient states understanding.  ?

## 2021-06-28 NOTE — Telephone Encounter (Signed)
Pt wanted to call and see if Dr Lorelei Pont had any opinions on the new vaccines being put out by [facility], a new Tdap and the 4th covid shot and he wasn't sure if its something he should do or not worry about. He asked for a call back at [phone] ?

## 2021-07-03 ENCOUNTER — Ambulatory Visit: Payer: No Typology Code available for payment source | Attending: Orthopaedic Surgery | Admitting: Physical Therapy

## 2021-07-03 ENCOUNTER — Encounter: Payer: Self-pay | Admitting: Physical Therapy

## 2021-07-03 DIAGNOSIS — R600 Localized edema: Secondary | ICD-10-CM | POA: Diagnosis not present

## 2021-07-03 DIAGNOSIS — M6281 Muscle weakness (generalized): Secondary | ICD-10-CM | POA: Insufficient documentation

## 2021-07-03 DIAGNOSIS — M19011 Primary osteoarthritis, right shoulder: Secondary | ICD-10-CM | POA: Diagnosis not present

## 2021-07-03 DIAGNOSIS — M25611 Stiffness of right shoulder, not elsewhere classified: Secondary | ICD-10-CM | POA: Diagnosis not present

## 2021-07-03 DIAGNOSIS — M25511 Pain in right shoulder: Secondary | ICD-10-CM | POA: Diagnosis not present
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
## 2021-07-05 ENCOUNTER — Ambulatory Visit: Payer: No Typology Code available for payment source | Admitting: Physical Therapy

## 2021-07-10 ENCOUNTER — Ambulatory Visit: Payer: No Typology Code available for payment source | Admitting: Physical Therapy

## 2021-07-12 ENCOUNTER — Encounter: Payer: No Typology Code available for payment source | Admitting: Physical Therapy

## 2021-07-30 DIAGNOSIS — D2272 Melanocytic nevi of left lower limb, including hip: Secondary | ICD-10-CM | POA: Diagnosis not present

## 2021-07-30 DIAGNOSIS — L570 Actinic keratosis: Secondary | ICD-10-CM | POA: Diagnosis not present

## 2021-07-30 DIAGNOSIS — L812 Freckles: Secondary | ICD-10-CM | POA: Diagnosis not present

## 2021-07-30 DIAGNOSIS — D1801 Hemangioma of skin and subcutaneous tissue: Secondary | ICD-10-CM | POA: Diagnosis not present

## 2021-07-30 DIAGNOSIS — L218 Other seborrheic dermatitis: Secondary | ICD-10-CM | POA: Diagnosis not present

## 2021-07-30 DIAGNOSIS — L821 Other seborrheic keratosis: Secondary | ICD-10-CM | POA: Diagnosis not present

## 2021-07-30 DIAGNOSIS — Z85828 Personal history of other malignant neoplasm of skin: Secondary | ICD-10-CM | POA: Diagnosis not present

## 2021-10-17 DIAGNOSIS — M5416 Radiculopathy, lumbar region: Secondary | ICD-10-CM | POA: Diagnosis not present

## 2021-10-19 ENCOUNTER — Other Ambulatory Visit: Payer: Self-pay | Admitting: Orthopedic Surgery

## 2021-10-19 DIAGNOSIS — M5416 Radiculopathy, lumbar region: Secondary | ICD-10-CM

## 2021-10-22 ENCOUNTER — Ambulatory Visit
Admission: RE | Admit: 2021-10-22 | Discharge: 2021-10-22 | Disposition: A | Discharge status: Home / Self Care | Payer: No Typology Code available for payment source | Source: Ambulatory Visit | Attending: Orthopedic Surgery | Admitting: Orthopedic Surgery

## 2021-10-22 DIAGNOSIS — M48061 Spinal stenosis, lumbar region without neurogenic claudication: Secondary | ICD-10-CM | POA: Diagnosis not present

## 2021-10-22 DIAGNOSIS — M545 Low back pain, unspecified: Secondary | ICD-10-CM | POA: Diagnosis not present

## 2021-10-22 DIAGNOSIS — M4316 Spondylolisthesis, lumbar region: Secondary | ICD-10-CM | POA: Diagnosis not present

## 2021-10-22 DIAGNOSIS — M5416 Radiculopathy, lumbar region: Secondary | ICD-10-CM

## 2021-11-13 ENCOUNTER — Emergency Department (HOSPITAL_COMMUNITY): Payer: No Typology Code available for payment source

## 2021-11-13 ENCOUNTER — Other Ambulatory Visit: Payer: Self-pay

## 2021-11-13 ENCOUNTER — Emergency Department (HOSPITAL_COMMUNITY)
Admission: EM | Admit: 2021-11-13 | Discharge: 2021-11-13 | Discharge status: Against Medical Advice | Payer: No Typology Code available for payment source | Attending: Emergency Medicine | Admitting: Emergency Medicine

## 2021-11-13 DIAGNOSIS — M542 Cervicalgia: Secondary | ICD-10-CM | POA: Diagnosis not present

## 2021-11-13 DIAGNOSIS — Z5321 Procedure and treatment not carried out due to patient leaving prior to being seen by health care provider: Secondary | ICD-10-CM | POA: Insufficient documentation

## 2021-11-13 DIAGNOSIS — Y92410 Unspecified street and highway as the place of occurrence of the external cause: Secondary | ICD-10-CM | POA: Insufficient documentation

## 2021-11-13 DIAGNOSIS — S0181XA Laceration without foreign body of other part of head, initial encounter: Secondary | ICD-10-CM | POA: Insufficient documentation

## 2021-11-13 DIAGNOSIS — Z041 Encounter for examination and observation following transport accident: Secondary | ICD-10-CM | POA: Diagnosis not present

## 2021-11-13 DIAGNOSIS — M47812 Spondylosis without myelopathy or radiculopathy, cervical region: Secondary | ICD-10-CM | POA: Diagnosis not present

## 2021-11-13 DIAGNOSIS — M25511 Pain in right shoulder: Secondary | ICD-10-CM | POA: Diagnosis not present

## 2021-11-13 DIAGNOSIS — Z471 Aftercare following joint replacement surgery: Secondary | ICD-10-CM | POA: Diagnosis not present

## 2021-11-13 DIAGNOSIS — Z96611 Presence of right artificial shoulder joint: Secondary | ICD-10-CM | POA: Diagnosis not present

## 2021-11-13 DIAGNOSIS — R9431 Abnormal electrocardiogram [ECG] [EKG]: Secondary | ICD-10-CM | POA: Diagnosis not present

## 2021-11-13 LAB — CBC WITH DIFFERENTIAL/PLATELET
Abs Immature Granulocytes: 0.07 10*3/uL (ref 0.00–0.07)
Basophils Absolute: 0.1 10*3/uL (ref 0.0–0.1)
Basophils Relative: 1 %
Eosinophils Absolute: 0.3 10*3/uL (ref 0.0–0.5)
Eosinophils Relative: 2 %
HCT: 41.9 % (ref 39.0–52.0)
Hemoglobin: 13.7 g/dL (ref 13.0–17.0)
Immature Granulocytes: 1 %
Lymphocytes Relative: 14 %
Lymphs Abs: 1.9 10*3/uL (ref 0.7–4.0)
MCH: 31.4 pg (ref 26.0–34.0)
MCHC: 32.7 g/dL (ref 30.0–36.0)
MCV: 95.9 fL (ref 80.0–100.0)
Monocytes Absolute: 0.9 10*3/uL (ref 0.1–1.0)
Monocytes Relative: 7 %
Neutro Abs: 10.4 10*3/uL — ABNORMAL HIGH (ref 1.7–7.7)
Neutrophils Relative %: 75 %
Platelets: 274 10*3/uL (ref 150–400)
RBC: 4.37 MIL/uL (ref 4.22–5.81)
RDW: 12.7 % (ref 11.5–15.5)
WBC: 13.6 10*3/uL — ABNORMAL HIGH (ref 4.0–10.5)
nRBC: 0 % (ref 0.0–0.2)

## 2021-11-13 LAB — BASIC METABOLIC PANEL
Anion gap: 10 (ref 5–15)
BUN: 22 mg/dL (ref 8–23)
CO2: 25 mmol/L (ref 22–32)
Calcium: 9.3 mg/dL (ref 8.9–10.3)
Chloride: 105 mmol/L (ref 98–111)
Creatinine, Ser: 0.97 mg/dL (ref 0.61–1.24)
GFR, Estimated: >60 mL/min (ref >60)
Glucose, Bld: 99 mg/dL (ref 70–99)
Potassium: 4.5 mmol/L (ref 3.5–5.1)
Sodium: 140 mmol/L (ref 135–145)

## 2021-11-13 MED ORDER — ACETAMINOPHEN 325 MG PO TABS
650.0000 mg | ORAL_TABLET | Freq: Four times a day (QID) | ORAL | Status: DC | PRN
  Administered 2021-11-13: 650 mg via ORAL
  Filled 2021-11-13: qty 2
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
## 2021-11-13 NOTE — ED Notes (Signed)
Pt stated that they no longer wanted to wait  

## 2021-11-14 ENCOUNTER — Telehealth: Payer: Self-pay | Admitting: Family Medicine
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
## 2021-11-15 ENCOUNTER — Encounter: Payer: Self-pay | Admitting: Family Medicine

## 2021-11-15 ENCOUNTER — Ambulatory Visit (INDEPENDENT_AMBULATORY_CARE_PROVIDER_SITE_OTHER): Payer: No Typology Code available for payment source | Admitting: Family Medicine

## 2021-11-15 ENCOUNTER — Ambulatory Visit: Payer: No Typology Code available for payment source | Admitting: Family Medicine

## 2021-11-15 VITALS — BP 122/64 | HR 70 | Temp 98.3°F | Ht 68.0 in | Wt 173.2 lb

## 2021-11-15 DIAGNOSIS — M542 Cervicalgia: Secondary | ICD-10-CM | POA: Diagnosis not present

## 2021-11-15 DIAGNOSIS — M5416 Radiculopathy, lumbar region: Secondary | ICD-10-CM

## 2021-11-15 DIAGNOSIS — S134XXA Sprain of ligaments of cervical spine, initial encounter: Secondary | ICD-10-CM

## 2021-11-15 MED ORDER — CYCLOBENZAPRINE HCL 10 MG PO TABS: 10.0000 mg | ORAL_TABLET | Freq: Every evening | ORAL | 1 refills | Status: DC | PRN

## 2021-11-15 MED ORDER — GABAPENTIN 100 MG PO CAPS: 100.0000 mg | ORAL_CAPSULE | Freq: Three times a day (TID) | ORAL | 3 refills | Status: DC

## 2021-11-15 NOTE — Patient Instructions (Signed)
Generic Gabapentin Titration Schedule  Generic Gabapentin (generic form of Neurontin) comes in 100 mg tablets or capsules.   You have to titrate your dose slowly to reduce side effects and reduce sedation / sleepiness.    Week               Breakfast  Lunch   Dinner One                 0   0   100 mg Two   '100mg'$    0   '100mg'$  Three   '100mg'$    '100mg'$    '100mg'$  Four   '100mg'$    '100mg'$    '200mg'$  (2 tabs) Five   '200mg'$  (2 tabs) '100mg'$    '200mg'$  (2 tabs) Six   '200mg'$  (2 tabs) '200mg'$  (2 tabs) '200mg'$  (2 tabs) Seven   '200mg'$  (2 tabs) '200mg'$  (2 tabs) '300mg'$  (3 tabs) Eight   '300mg'$  (3 tabs) '200mg'$  (2 tabs) '300mg'$  (3 tabs)  If you have any problems at any time, drop back to the previous dosing schedule. Continue with this dose for 1 week, and then try to go up the next step again.
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
## 2021-11-20 DIAGNOSIS — M542 Cervicalgia: Secondary | ICD-10-CM | POA: Diagnosis not present

## 2021-11-23 DIAGNOSIS — Z6826 Body mass index (BMI) 26.0-26.9, adult: Secondary | ICD-10-CM | POA: Diagnosis not present

## 2021-11-23 DIAGNOSIS — M48062 Spinal stenosis, lumbar region with neurogenic claudication: Secondary | ICD-10-CM | POA: Diagnosis not present

## 2021-11-23 DIAGNOSIS — M47812 Spondylosis without myelopathy or radiculopathy, cervical region: Secondary | ICD-10-CM | POA: Diagnosis not present

## 2021-11-23 DIAGNOSIS — M47816 Spondylosis without myelopathy or radiculopathy, lumbar region: Secondary | ICD-10-CM | POA: Diagnosis not present

## 2021-11-23 DIAGNOSIS — M542 Cervicalgia: Secondary | ICD-10-CM | POA: Diagnosis not present

## 2021-11-26 DIAGNOSIS — M542 Cervicalgia: Secondary | ICD-10-CM | POA: Diagnosis not present

## 2021-11-26 DIAGNOSIS — M545 Low back pain, unspecified: Secondary | ICD-10-CM | POA: Diagnosis not present

## 2021-11-26 DIAGNOSIS — Z008 Encounter for other general examination: Secondary | ICD-10-CM | POA: Diagnosis not present

## 2021-11-26 DIAGNOSIS — Z6826 Body mass index (BMI) 26.0-26.9, adult: Secondary | ICD-10-CM | POA: Diagnosis not present

## 2021-11-26 DIAGNOSIS — E663 Overweight: Secondary | ICD-10-CM | POA: Diagnosis not present

## 2021-11-29 ENCOUNTER — Other Ambulatory Visit: Payer: Self-pay | Admitting: Orthopedic Surgery

## 2021-11-29 DIAGNOSIS — M545 Low back pain, unspecified: Secondary | ICD-10-CM

## 2021-11-29 DIAGNOSIS — M5416 Radiculopathy, lumbar region: Secondary | ICD-10-CM

## 2021-12-01 ENCOUNTER — Encounter: Payer: Self-pay | Admitting: Family Medicine

## 2021-12-03 ENCOUNTER — Telehealth: Payer: Self-pay | Admitting: Family Medicine

## 2021-12-03 NOTE — Telephone Encounter (Signed)
Mr. Kilgallon notified by MyChart that his records are ready to be picked up at the front desk.
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
## 2021-12-03 NOTE — Telephone Encounter (Signed)
Patient will need 2 copies of each.

## 2021-12-04 ENCOUNTER — Other Ambulatory Visit: Payer: Self-pay | Admitting: Family Medicine

## 2021-12-04 DIAGNOSIS — M542 Cervicalgia: Secondary | ICD-10-CM | POA: Diagnosis not present

## 2021-12-04 NOTE — Telephone Encounter (Signed)
Pt called stating pharmacy told him they couldn't refill meds, gabapentin (NEURONTIN) 100 MG capsule due to insurance not covering meds. Pt is requesting a call back to discuss the dosage. Call back # [phone]

## 2021-12-04 NOTE — Telephone Encounter (Signed)
Spoke with Mr. Lacher.  He states he has titrated up on the Gabapentin and is taking 3 capsules 3 times a day.  He needs a new Rx with updated dosing instruction so insurance will pay for refill.

## 2021-12-05 ENCOUNTER — Ambulatory Visit
Admission: RE | Admit: 2021-12-05 | Discharge: 2021-12-05 | Disposition: A | Discharge status: Home / Self Care | Payer: No Typology Code available for payment source | Source: Ambulatory Visit | Attending: Orthopedic Surgery | Admitting: Orthopedic Surgery

## 2021-12-05 DIAGNOSIS — M545 Low back pain, unspecified: Secondary | ICD-10-CM

## 2021-12-05 DIAGNOSIS — M5416 Radiculopathy, lumbar region: Secondary | ICD-10-CM

## 2021-12-05 DIAGNOSIS — M48061 Spinal stenosis, lumbar region without neurogenic claudication: Secondary | ICD-10-CM | POA: Diagnosis not present

## 2021-12-05 MED ORDER — GABAPENTIN 300 MG PO CAPS: 300.0000 mg | ORAL_CAPSULE | Freq: Three times a day (TID) | ORAL | 3 refills | Status: DC

## 2021-12-05 NOTE — Telephone Encounter (Signed)
Kyle [name] notified by telephone that Dr. Lorelei Pont has sent in a new dose of Gabapentin for 300 mg so he will just need to take one capsule by mouth three times a day.  Patient states understanding.

## 2021-12-10 DIAGNOSIS — M5416 Radiculopathy, lumbar region: Secondary | ICD-10-CM | POA: Diagnosis not present

## 2021-12-17 DIAGNOSIS — M48062 Spinal stenosis, lumbar region with neurogenic claudication: Secondary | ICD-10-CM | POA: Diagnosis not present

## 2022-01-01 DIAGNOSIS — M48062 Spinal stenosis, lumbar region with neurogenic claudication: Secondary | ICD-10-CM | POA: Diagnosis not present

## 2022-01-04 DIAGNOSIS — M5416 Radiculopathy, lumbar region: Secondary | ICD-10-CM | POA: Diagnosis not present

## 2022-01-04 DIAGNOSIS — S32050A Wedge compression fracture of fifth lumbar vertebra, initial encounter for closed fracture: Secondary | ICD-10-CM | POA: Insufficient documentation

## 2022-01-04 DIAGNOSIS — M48062 Spinal stenosis, lumbar region with neurogenic claudication: Secondary | ICD-10-CM | POA: Diagnosis not present

## 2022-01-21 DIAGNOSIS — M545 Low back pain, unspecified: Secondary | ICD-10-CM | POA: Diagnosis not present

## 2022-01-31 DIAGNOSIS — M48062 Spinal stenosis, lumbar region with neurogenic claudication: Secondary | ICD-10-CM | POA: Diagnosis not present

## 2022-01-31 DIAGNOSIS — R69 Illness, unspecified: Secondary | ICD-10-CM | POA: Diagnosis not present

## 2022-02-04 DIAGNOSIS — L821 Other seborrheic keratosis: Secondary | ICD-10-CM | POA: Diagnosis not present

## 2022-02-04 DIAGNOSIS — L820 Inflamed seborrheic keratosis: Secondary | ICD-10-CM | POA: Diagnosis not present

## 2022-02-04 DIAGNOSIS — D225 Melanocytic nevi of trunk: Secondary | ICD-10-CM | POA: Diagnosis not present

## 2022-02-04 DIAGNOSIS — Z85828 Personal history of other malignant neoplasm of skin: Secondary | ICD-10-CM | POA: Diagnosis not present

## 2022-02-04 DIAGNOSIS — L570 Actinic keratosis: Secondary | ICD-10-CM | POA: Diagnosis not present

## 2022-02-04 DIAGNOSIS — L812 Freckles: Secondary | ICD-10-CM | POA: Diagnosis not present

## 2022-02-04 DIAGNOSIS — L738 Other specified follicular disorders: Secondary | ICD-10-CM | POA: Diagnosis not present

## 2022-02-11 DIAGNOSIS — M48062 Spinal stenosis, lumbar region with neurogenic claudication: Secondary | ICD-10-CM | POA: Diagnosis not present

## 2022-02-15 ENCOUNTER — Other Ambulatory Visit: Payer: Self-pay | Admitting: Orthopedic Surgery

## 2022-02-15 DIAGNOSIS — S32050S Wedge compression fracture of fifth lumbar vertebra, sequela: Secondary | ICD-10-CM

## 2022-03-06 ENCOUNTER — Ambulatory Visit
Admission: RE | Admit: 2022-03-06 | Discharge: 2022-03-06 | Disposition: A | Discharge status: Home / Self Care | Payer: No Typology Code available for payment source | Source: Ambulatory Visit | Attending: Orthopedic Surgery | Admitting: Orthopedic Surgery

## 2022-03-06 DIAGNOSIS — M545 Low back pain, unspecified: Secondary | ICD-10-CM | POA: Diagnosis not present

## 2022-03-06 DIAGNOSIS — M48061 Spinal stenosis, lumbar region without neurogenic claudication: Secondary | ICD-10-CM | POA: Diagnosis not present

## 2022-03-06 DIAGNOSIS — S32050S Wedge compression fracture of fifth lumbar vertebra, sequela: Secondary | ICD-10-CM

## 2022-03-12 DIAGNOSIS — M5416 Radiculopathy, lumbar region: Secondary | ICD-10-CM | POA: Diagnosis not present

## 2022-03-21 ENCOUNTER — Other Ambulatory Visit: Payer: Self-pay | Admitting: Orthopedic Surgery

## 2022-04-01 ENCOUNTER — Ambulatory Visit (INDEPENDENT_AMBULATORY_CARE_PROVIDER_SITE_OTHER): Payer: No Typology Code available for payment source

## 2022-04-01 VITALS — Ht 68.0 in | Wt 173.0 lb

## 2022-04-01 DIAGNOSIS — Z0000 Encounter for general adult medical examination without abnormal findings: Secondary | ICD-10-CM
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
## 2022-04-01 NOTE — Patient Instructions (Addendum)
Kyle [name] , Thank you for taking time to come for your Medicare Wellness Visit. I appreciate your ongoing commitment to your health goals. Please review the following plan we discussed and let me know if I can assist you in the future.   These are the goals we discussed:  Goals Addressed               This Visit's Progress     Patient Stated     Increase physical activity (pt-stated)        Add stationary bike to exercise routine         This is a list of the screening recommended for you and due dates:  Health Maintenance  Topic Date Due   COVID-19 Vaccine (6 - 2023-24 season) 04/17/2022*   Medicare Annual Wellness Visit  04/02/2023   DTaP/Tdap/Td vaccine (3 - Td or Tdap) 02/10/2024   Pneumonia Vaccine  Completed   Flu Shot  Completed   Hepatitis C Screening: USPSTF Recommendation to screen - Ages 18-79 yo.  Completed   Zoster (Shingles) Vaccine  Completed   HPV Vaccine  Aged Out   Colon Cancer Screening  Discontinued  *Topic was postponed. The date shown is not the original due date.    Advanced directives: End of life planning; Advance aging; Advanced directives discussed.  Copy of current HCPOA/Living Will requested.    Conditions/risks identified: none new  Next appointment: Follow up in one year for your annual wellness visit.   Preventive Care 76 Years and Older, Male  Preventive care refers to lifestyle choices and visits with your health care provider that can promote health and wellness. What does preventive care include? A yearly physical exam. This is also called an annual well check. Dental exams once or twice a year. Routine eye exams. Ask your health care provider how often you should have your eyes checked. Personal lifestyle choices, including: Daily care of your teeth and gums. Regular physical activity. Eating a healthy diet. Avoiding tobacco and drug use. Limiting alcohol use. Practicing safe sex. Taking low doses of aspirin every  day. Taking vitamin and mineral supplements as recommended by your health care provider. What happens during an annual well check? The services and screenings done by your health care provider during your annual well check will depend on your age, overall health, lifestyle risk factors, and family history of disease. Counseling  Your health care provider may ask you questions about your: Alcohol use. Tobacco use. Drug use. Emotional well-being. Home and relationship well-being. Sexual activity. Eating habits. History of falls. Memory and ability to understand (cognition). Work and work Statistician. Screening  You may have the following tests or measurements: Height, weight, and BMI. Blood pressure. Lipid and cholesterol levels. These may be checked every 5 years, or more frequently if you are over 53 years old. Skin check. Lung cancer screening. You may have this screening every year starting at age 44 if you have a 30-pack-year history of smoking and currently smoke or have quit within the past 15 years. Fecal occult blood test (FOBT) of the stool. You may have this test every year starting at age 72. Flexible sigmoidoscopy or colonoscopy. You may have a sigmoidoscopy every 5 years or a colonoscopy every 10 years starting at age 12. Prostate cancer screening. Recommendations will vary depending on your family history and other risks. Hepatitis C blood test. Hepatitis B blood test. Sexually transmitted disease (STD) testing. Diabetes screening. This is done by checking your blood  sugar (glucose) after you have not eaten for a while (fasting). You may have this done every 1-3 years. Abdominal aortic aneurysm (AAA) screening. You may need this if you are a current or former smoker. Osteoporosis. You may be screened starting at age 5 if you are at high risk. Talk with your health care provider about your test results, treatment options, and if necessary, the need for more  tests. Vaccines  Your health care provider may recommend certain vaccines, such as: Influenza vaccine. This is recommended every year. Tetanus, diphtheria, and acellular pertussis (Tdap, Td) vaccine. You may need a Td booster every 10 years. Zoster vaccine. You may need this after age 42. Pneumococcal 13-valent conjugate (PCV13) vaccine. One dose is recommended after age 43. Pneumococcal polysaccharide (PPSV23) vaccine. One dose is recommended after age 26. Talk to your health care provider about which screenings and vaccines you need and how often you need them. This information is not intended to replace advice given to you by your health care provider. Make sure you discuss any questions you have with your health care provider. Document Released: 03/10/2015 Document Revised: 11/01/2015 Document Reviewed: 12/13/2014 Elsevier Interactive Patient Education  2017 [place] Prevention in the Home Falls can cause injuries. They can happen to people of all ages. There are many things you can do to make your home safe and to help prevent falls. What can I do on the outside of my home? Regularly fix the edges of walkways and driveways and fix any cracks. Remove anything that might make you trip as you walk through a door, such as a raised step or threshold. Trim any bushes or trees on the path to your home. Use bright outdoor lighting. Clear any walking paths of anything that might make someone trip, such as rocks or tools. Regularly check to see if handrails are loose or broken. Make sure that both sides of any steps have handrails. Any raised decks and porches should have guardrails on the edges. Have any leaves, snow, or ice cleared regularly. Use sand or salt on walking paths during winter. Clean up any spills in your garage right away. This includes oil or grease spills. What can I do in the bathroom? Use night lights. Install grab bars by the toilet and in the tub and shower.  Do not use towel bars as grab bars. Use non-skid mats or decals in the tub or shower. If you need to sit down in the shower, use a plastic, non-slip stool. Keep the floor dry. Clean up any water that spills on the floor as soon as it happens. Remove soap buildup in the tub or shower regularly. Attach bath mats securely with double-sided non-slip rug tape. Do not have throw rugs and other things on the floor that can make you trip. What can I do in the bedroom? Use night lights. Make sure that you have a light by your bed that is easy to reach. Do not use any sheets or blankets that are too big for your bed. They should not hang down onto the floor. Have a firm chair that has side arms. You can use this for support while you get dressed. Do not have throw rugs and other things on the floor that can make you trip. What can I do in the kitchen? Clean up any spills right away. Avoid walking on wet floors. Keep items that you use a lot in easy-to-reach places. If you need to reach something above you,  use a strong step stool that has a grab bar. Keep electrical cords out of the way. Do not use floor polish or wax that makes floors slippery. If you must use wax, use non-skid floor wax. Do not have throw rugs and other things on the floor that can make you trip. What can I do with my stairs? Do not leave any items on the stairs. Make sure that there are handrails on both sides of the stairs and use them. Fix handrails that are broken or loose. Make sure that handrails are as long as the stairways. Check any carpeting to make sure that it is firmly attached to the stairs. Fix any carpet that is loose or worn. Avoid having throw rugs at the top or bottom of the stairs. If you do have throw rugs, attach them to the floor with carpet tape. Make sure that you have a light switch at the top of the stairs and the bottom of the stairs. If you do not have them, ask someone to add them for you. What else  can I do to help prevent falls? Wear shoes that: Do not have high heels. Have rubber bottoms. Are comfortable and fit you well. Are closed at the toe. Do not wear sandals. If you use a stepladder: Make sure that it is fully opened. Do not climb a closed stepladder. Make sure that both sides of the stepladder are locked into place. Ask someone to hold it for you, if possible. Clearly mark and make sure that you can see: Any grab bars or handrails. First and last steps. Where the edge of each step is. Use tools that help you move around (mobility aids) if they are needed. These include: Canes. Walkers. Scooters. Crutches. Turn on the lights when you go into a dark area. Replace any light bulbs as soon as they burn out. Set up your furniture so you have a clear path. Avoid moving your furniture around. If any of your floors are uneven, fix them. If there are any pets around you, be aware of where they are. Review your medicines with your doctor. Some medicines can make you feel dizzy. This can increase your chance of falling. Ask your doctor what other things that you can do to help prevent falls. This information is not intended to replace advice given to you by your health care provider. Make sure you discuss any questions you have with your health care provider. Document Released: 12/08/2008 Document Revised: 07/20/2015 Document Reviewed: 03/18/2014 Elsevier Interactive Patient Education  2017 [employer].

## 2022-04-07 ENCOUNTER — Other Ambulatory Visit: Payer: Self-pay | Admitting: Family Medicine

## 2022-04-07 DIAGNOSIS — E782 Mixed hyperlipidemia: Secondary | ICD-10-CM

## 2022-04-07 DIAGNOSIS — Z79899 Other long term (current) drug therapy: Secondary | ICD-10-CM

## 2022-04-07 DIAGNOSIS — R739 Hyperglycemia, unspecified: Secondary | ICD-10-CM

## 2022-04-07 DIAGNOSIS — Z125 Encounter for screening for malignant neoplasm of prostate: Secondary | ICD-10-CM

## 2022-04-09 ENCOUNTER — Other Ambulatory Visit (INDEPENDENT_AMBULATORY_CARE_PROVIDER_SITE_OTHER): Payer: No Typology Code available for payment source

## 2022-04-09 DIAGNOSIS — R739 Hyperglycemia, unspecified: Secondary | ICD-10-CM

## 2022-04-09 DIAGNOSIS — E782 Mixed hyperlipidemia: Secondary | ICD-10-CM

## 2022-04-09 DIAGNOSIS — Z79899 Other long term (current) drug therapy: Secondary | ICD-10-CM | POA: Diagnosis not present

## 2022-04-09 DIAGNOSIS — Z125 Encounter for screening for malignant neoplasm of prostate: Secondary | ICD-10-CM | POA: Diagnosis not present

## 2022-04-09 LAB — LIPID PANEL
Cholesterol: 194 mg/dL (ref 0–200)
HDL: 66.7 mg/dL (ref >39.00)
LDL Cholesterol: 113 mg/dL — ABNORMAL HIGH (ref 0–99)
NonHDL: 126.98
Total CHOL/HDL Ratio: 3
Triglycerides: 72 mg/dL (ref 0.0–149.0)
VLDL: 14.4 mg/dL (ref 0.0–40.0)

## 2022-04-09 LAB — BASIC METABOLIC PANEL
BUN: 15 mg/dL (ref 6–23)
CO2: 28 mEq/L (ref 19–32)
Calcium: 9.3 mg/dL (ref 8.4–10.5)
Chloride: 100 mEq/L (ref 96–112)
Creatinine, Ser: 0.87 mg/dL (ref 0.40–1.50)
GFR: 84.45 mL/min (ref >60.00)
Glucose, Bld: 96 mg/dL (ref 70–99)
Potassium: 4.3 mEq/L (ref 3.5–5.1)
Sodium: 136 mEq/L (ref 135–145)

## 2022-04-09 LAB — HEPATIC FUNCTION PANEL
ALT: 20 U/L (ref 0–53)
AST: 21 U/L (ref 0–37)
Albumin: 3.9 g/dL (ref 3.5–5.2)
Alkaline Phosphatase: 72 U/L (ref 39–117)
Bilirubin, Direct: 0.1 mg/dL (ref 0.0–0.3)
Total Bilirubin: 0.7 mg/dL (ref 0.2–1.2)
Total Protein: 7 g/dL (ref 6.0–8.3)

## 2022-04-09 LAB — CBC WITH DIFFERENTIAL/PLATELET
Basophils Absolute: 0.1 10*3/uL (ref 0.0–0.1)
Basophils Relative: 1 % (ref 0.0–3.0)
Eosinophils Absolute: 0.4 10*3/uL (ref 0.0–0.7)
Eosinophils Relative: 3.5 % (ref 0.0–5.0)
HCT: 43.9 % (ref 39.0–52.0)
Hemoglobin: 14.9 g/dL (ref 13.0–17.0)
Lymphocytes Relative: 30.5 % (ref 12.0–46.0)
Lymphs Abs: 3.5 10*3/uL (ref 0.7–4.0)
MCHC: 34 g/dL (ref 30.0–36.0)
MCV: 95 fl (ref 78.0–100.0)
Monocytes Absolute: 1 10*3/uL (ref 0.1–1.0)
Monocytes Relative: 8.8 % (ref 3.0–12.0)
Neutro Abs: 6.4 10*3/uL (ref 1.4–7.7)
Neutrophils Relative %: 56.2 % (ref 43.0–77.0)
Platelets: 298 10*3/uL (ref 150.0–400.0)
RBC: 4.62 Mil/uL (ref 4.22–5.81)
RDW: 12.9 % (ref 11.5–15.5)
WBC: 11.4 10*3/uL — ABNORMAL HIGH (ref 4.0–10.5)

## 2022-04-09 LAB — PSA, MEDICARE: PSA: 0.84 ng/ml (ref 0.10–4.00)

## 2022-04-09 LAB — HEMOGLOBIN A1C: Hgb A1c MFr Bld: 5.7 % (ref 4.6–6.5)
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
## 2022-04-15 ENCOUNTER — Ambulatory Visit (INDEPENDENT_AMBULATORY_CARE_PROVIDER_SITE_OTHER): Payer: No Typology Code available for payment source | Admitting: Family Medicine

## 2022-04-15 ENCOUNTER — Encounter: Payer: Self-pay | Admitting: Family Medicine

## 2022-04-15 VITALS — BP 110/70 | HR 75 | Temp 98.0°F | Ht 67.75 in | Wt 190.2 lb

## 2022-04-15 DIAGNOSIS — Z0000 Encounter for general adult medical examination without abnormal findings: Secondary | ICD-10-CM | POA: Diagnosis not present

## 2022-04-23 NOTE — Pre-Procedure Instructions (Signed)
Surgical Instructions    Your procedure is scheduled on May 01, 2022.  Report to [facility] Main Entrance "A" at 6:30 A.M., then check in with the Admitting office.  Call this number if you have problems the morning of surgery:  [phone]  If you have any questions prior to your surgery date call [phone]: Open Monday-Friday 8am-4pm If you experience any cold or flu symptoms such as cough, fever, chills, shortness of breath, etc. between now and your scheduled surgery, please notify us at the above number.     Remember:  Do not eat after midnight the night before your surgery  You may drink clear liquids until 5:30 AM the morning of your surgery.   Clear liquids allowed are: Water, Non-Citrus Juices (without pulp), Carbonated Beverages, Clear Tea, Black Coffee Only (NO MILK, CREAM OR POWDERED CREAMER of any kind), and Gatorade.  Patient Instructions  The night before surgery:  No food after midnight. ONLY clear liquids after midnight  The day of surgery (if you do NOT have diabetes):  Drink ONE (1) Pre-Surgery Clear Ensure by 5:30 AM the morning of surgery. Drink in one sitting. Do not sip.  This drink was given to you during your hospital  pre-op appointment visit.  Nothing else to drink after completing the  Pre-Surgery Clear Ensure.         If you have questions, please contact your surgeon's office.      Take these medicines the morning of surgery with A SIP OF WATER:  NONE   As of today, STOP taking any Aspirin (unless otherwise instructed by your surgeon) Aleve, Naproxen, Ibuprofen, Motrin, Advil, Goody's, BC's, all herbal medications, fish oil, and all vitamins.                     Do NOT Smoke (Tobacco/Vaping) for 24 hours prior to your procedure.  If you use a CPAP at night, you may bring your mask/headgear for your overnight stay.   Contacts, glasses, piercing's, hearing aid's, dentures or partials may not be worn into surgery, please bring cases for  these belongings.    For patients admitted to the hospital, discharge time will be determined by your treatment team.   Patients discharged the day of surgery will not be allowed to drive home, and someone needs to stay with them for 24 hours.  SURGICAL WAITING ROOM VISITATION Patients having surgery or a procedure may have no more than 2 support people in the waiting area - these visitors may rotate.   Children under the age of 50 must have an adult with them who is not the patient. If the patient needs to stay at the hospital during part of their recovery, the visitor guidelines for inpatient rooms apply. Pre-op nurse will coordinate an appropriate time for 1 support person to accompany patient in pre-op.  This support person may not rotate.   Please refer to the [facility] website for the visitor guidelines for Inpatients (after your surgery is over and you are in a regular room).    Special instructions:   [place]- Preparing For Surgery  Before surgery, you can play an important role. Because skin is not sterile, your skin needs to be as free of germs as possible. You can reduce the number of germs on your skin by washing with CHG (chlorahexidine gluconate) Soap before surgery.  CHG is an antiseptic cleaner which kills germs and bonds with the skin to continue killing germs even after washing.  Oral Hygiene is also important to reduce your risk of infection.  Remember - BRUSH YOUR TEETH THE MORNING OF SURGERY WITH YOUR REGULAR TOOTHPASTE  Please do not use if you have an allergy to CHG or antibacterial soaps. If your skin becomes reddened/irritated stop using the CHG.  Do not shave (including legs and underarms) for at least 48 hours prior to first CHG shower. It is OK to shave your face.  Please follow these instructions carefully.   Shower the NIGHT BEFORE SURGERY and the MORNING OF SURGERY  If you chose to wash your hair, wash your hair first as usual with your normal  shampoo.  After you shampoo, rinse your hair and body thoroughly to remove the shampoo.  Use CHG Soap as you would any other liquid soap. You can apply CHG directly to the skin and wash gently with a scrungie or a clean washcloth.   Apply the CHG Soap to your body ONLY FROM THE NECK DOWN.  Do not use on open wounds or open sores. Avoid contact with your eyes, ears, mouth and genitals (private parts). Wash Face and genitals (private parts)  with your normal soap.   Wash thoroughly, paying special attention to the area where your surgery will be performed.  Thoroughly rinse your body with warm water from the neck down.  DO NOT shower/wash with your normal soap after using and rinsing off the CHG Soap.  Pat yourself dry with a CLEAN TOWEL.  Wear CLEAN PAJAMAS to bed the night before surgery  Place CLEAN SHEETS on your bed the night before your surgery  DO NOT SLEEP WITH PETS.   Day of Surgery: Take a shower with CHG soap. Do not wear jewelry or makeup Do not wear lotions, powders, perfumes/colognes, or deodorant. Do not shave 48 hours prior to surgery.  Men may shave face and neck. Do not bring valuables to the hospital.  [facility] is not responsible for any belongings or valuables. Do not wear nail polish, gel polish, artificial nails, or any other type of covering on natural nails (fingers and toes) If you have artificial nails or gel coating that need to be removed by a nail salon, please have this removed prior to surgery. Artificial nails or gel coating may interfere with anesthesia's ability to adequately monitor your vital signs.  Wear Clean/Comfortable clothing the morning of surgery Remember to brush your teeth WITH YOUR REGULAR TOOTHPASTE.   Please read over the following fact sheets that you were given.    If you received a COVID test during your pre-op visit  it is requested that you wear a mask when out in public, stay away from anyone that may not be feeling well  and notify your surgeon if you develop symptoms. If you have been in contact with anyone that has tested positive in the last 10 days please notify you surgeon.

## 2022-04-24 ENCOUNTER — Other Ambulatory Visit: Payer: Self-pay

## 2022-04-24 ENCOUNTER — Encounter (HOSPITAL_COMMUNITY): Payer: Self-pay

## 2022-04-24 ENCOUNTER — Encounter (HOSPITAL_COMMUNITY)
Admission: RE | Admit: 2022-04-24 | Discharge: 2022-04-24 | Disposition: A | Discharge status: Home / Self Care | Payer: No Typology Code available for payment source | Source: Ambulatory Visit | Attending: Orthopedic Surgery | Admitting: Orthopedic Surgery

## 2022-04-24 VITALS — BP 139/84 | HR 75 | Temp 98.6°F | Resp 17 | Ht 67.75 in | Wt 192.0 lb

## 2022-04-24 DIAGNOSIS — Z01818 Encounter for other preprocedural examination: Secondary | ICD-10-CM | POA: Diagnosis not present

## 2022-04-24 HISTORY — DX: Unspecified osteoarthritis, unspecified site: M19.90

## 2022-04-24 LAB — TYPE AND SCREEN
ABO/RH(D): A POS
Antibody Screen: NEGATIVE

## 2022-04-24 LAB — SURGICAL PCR SCREEN
MRSA, PCR: NEGATIVE
Staphylococcus aureus: NEGATIVE

## 2022-04-24 NOTE — Progress Notes (Signed)
PCP - Dr. Frederico Hamman Copland Cardiologist - Denies  PPM/ICD - Denies Device Orders - n/a Rep Notified - n/a  Chest x-ray - n/a EKG - 11/14/2021 Stress Test - Denies ECHO - Denies Cardiac Cath - Denies  Sleep Study - Denies CPAP - n/a  No DM  Last dose of GLP1 agonist- n/a  GLP1 instructions: n/a  Blood Thinner Instructions: n/a Aspirin Instructions: n/a  ERAS Protcol - Clear liquids until 0530 morning of surgery PRE-SURGERY Ensure or G2- Ensure given to pt with instructions  COVID TEST- n/a   Anesthesia review: No.   Patient denies shortness of breath, fever, cough and chest pain at PAT appointment   All instructions explained to the patient, with a verbal understanding of the material. Patient agrees to go over the instructions while at home for a better understanding. Patient also instructed to self quarantine after being tested for COVID-19. The opportunity to ask questions was provided.

## 2022-04-30 NOTE — Anesthesia Preprocedure Evaluation (Signed)
Anesthesia Evaluation  Patient identified by MRN, date of birth, ID band Patient awake    Reviewed: Allergy & Precautions, NPO status , Patient's Chart, lab work & pertinent test results  History of Anesthesia Complications Negative for: history of anesthetic complications  Airway Mallampati: III  TM Distance: >3 FB Neck ROM: Limited    Dental no notable dental hx. (+) Dental Advisory Given, Teeth Intact   Pulmonary neg pulmonary ROS   Pulmonary exam normal breath sounds clear to auscultation       Cardiovascular negative cardio ROS Normal cardiovascular exam Rhythm:Regular Rate:Normal     Neuro/Psych negative neurological ROS     GI/Hepatic negative GI ROS, Neg liver ROS,,,  Endo/Other  negative endocrine ROS    Renal/GU      Musculoskeletal  (+) Arthritis ,    Abdominal   Peds  Hematology negative hematology ROS (+)   Anesthesia Other Findings   Reproductive/Obstetrics                             Anesthesia Physical Anesthesia Plan  ASA: 2  Anesthesia Plan: General   Post-op Pain Management: Ketamine IV*, Lidocaine infusion*, Gabapentin PO (pre-op)* and Tylenol PO (pre-op)*   Induction:   PONV Risk Score and Plan: 3 and Ondansetron, Dexamethasone, Propofol infusion and Treatment may vary due to age or medical condition  Airway Management Planned: Oral ETT  Additional Equipment:   Intra-op Plan:   Post-operative Plan: Extubation in OR  Informed Consent: I have reviewed the patients History and Physical, chart, labs and discussed the procedure including the risks, benefits and alternatives for the proposed anesthesia with the patient or authorized representative who has indicated his/her understanding and acceptance.     Dental advisory given  Plan Discussed with: CRNA  Anesthesia Plan Comments: (2 x PIV, +/- glidescope)        Anesthesia Quick Evaluation

## 2022-05-01 ENCOUNTER — Inpatient Hospital Stay (HOSPITAL_COMMUNITY): Payer: No Typology Code available for payment source

## 2022-05-01 ENCOUNTER — Encounter (HOSPITAL_COMMUNITY)
Admission: RE | Disposition: A | Discharge status: Home / Self Care | Payer: Self-pay | Source: Home / Self Care | Attending: Orthopedic Surgery

## 2022-05-01 ENCOUNTER — Encounter (HOSPITAL_COMMUNITY): Payer: Self-pay | Admitting: Orthopedic Surgery

## 2022-05-01 ENCOUNTER — Telehealth: Payer: Self-pay | Admitting: Family Medicine

## 2022-05-01 ENCOUNTER — Inpatient Hospital Stay (HOSPITAL_COMMUNITY): Payer: No Typology Code available for payment source | Admitting: Anesthesiology

## 2022-05-01 ENCOUNTER — Other Ambulatory Visit: Payer: Self-pay | Admitting: Family Medicine

## 2022-05-01 ENCOUNTER — Inpatient Hospital Stay (HOSPITAL_COMMUNITY)
Admission: RE | Admit: 2022-05-01 | Discharge: 2022-05-01 | DRG: 552 | Disposition: A | Discharge status: Home / Self Care | Payer: No Typology Code available for payment source | Attending: Orthopedic Surgery | Admitting: Orthopedic Surgery

## 2022-05-01 ENCOUNTER — Other Ambulatory Visit: Payer: Self-pay

## 2022-05-01 DIAGNOSIS — M4316 Spondylolisthesis, lumbar region: Secondary | ICD-10-CM

## 2022-05-01 DIAGNOSIS — Z885 Allergy status to narcotic agent status: Secondary | ICD-10-CM

## 2022-05-01 DIAGNOSIS — Z79899 Other long term (current) drug therapy: Secondary | ICD-10-CM | POA: Diagnosis not present

## 2022-05-01 DIAGNOSIS — G629 Polyneuropathy, unspecified: Secondary | ICD-10-CM | POA: Diagnosis present

## 2022-05-01 DIAGNOSIS — M48061 Spinal stenosis, lumbar region without neurogenic claudication: Secondary | ICD-10-CM

## 2022-05-01 DIAGNOSIS — Z981 Arthrodesis status: Principal | ICD-10-CM

## 2022-05-01 DIAGNOSIS — Z538 Procedure and treatment not carried out for other reasons: Secondary | ICD-10-CM | POA: Diagnosis not present

## 2022-05-01 DIAGNOSIS — M79604 Pain in right leg: Secondary | ICD-10-CM | POA: Diagnosis present

## 2022-05-01 DIAGNOSIS — R001 Bradycardia, unspecified: Secondary | ICD-10-CM | POA: Diagnosis not present

## 2022-05-01 DIAGNOSIS — M532X6 Spinal instabilities, lumbar region: Secondary | ICD-10-CM

## 2022-05-01 DIAGNOSIS — I9589 Other hypotension: Secondary | ICD-10-CM

## 2022-05-01 DIAGNOSIS — Z96611 Presence of right artificial shoulder joint: Secondary | ICD-10-CM | POA: Diagnosis not present

## 2022-05-01 DIAGNOSIS — Z96653 Presence of artificial knee joint, bilateral: Secondary | ICD-10-CM | POA: Diagnosis not present

## 2022-05-01 DIAGNOSIS — M79605 Pain in left leg: Secondary | ICD-10-CM | POA: Diagnosis present

## 2022-05-01 DIAGNOSIS — I483 Typical atrial flutter: Secondary | ICD-10-CM

## 2022-05-01 DIAGNOSIS — I4892 Unspecified atrial flutter: Secondary | ICD-10-CM | POA: Diagnosis not present

## 2022-05-01 DIAGNOSIS — M1990 Unspecified osteoarthritis, unspecified site: Secondary | ICD-10-CM | POA: Diagnosis not present

## 2022-05-01 LAB — ABO/RH: ABO/RH(D): A POS

## 2022-05-01 SURGERY — CANCELLED PROCEDURE
Anesthesia: General

## 2022-05-01 MED ORDER — POVIDONE-IODINE 7.5 % EX SOLN: Freq: Once | CUTANEOUS | Status: DC

## 2022-05-01 MED ORDER — GABAPENTIN 300 MG PO CAPS
300.0000 mg | ORAL_CAPSULE | Freq: Once | ORAL | Status: AC
  Administered 2022-05-01: 300 mg via ORAL
  Filled 2022-05-01: qty 1

## 2022-05-01 MED ORDER — CEFAZOLIN SODIUM-DEXTROSE 2-4 GM/100ML-% IV SOLN
2.0000 g | INTRAVENOUS | Status: AC
  Administered 2022-05-01: 2 g via INTRAVENOUS
  Filled 2022-05-01: qty 100

## 2022-05-01 MED ORDER — ORAL CARE MOUTH RINSE: 15.0000 mL | Freq: Once | OROMUCOSAL | Status: AC

## 2022-05-01 MED ORDER — ONDANSETRON HCL 4 MG/2ML IJ SOLN
INTRAMUSCULAR | Status: DC | PRN
  Administered 2022-05-01: 4 mg via INTRAVENOUS

## 2022-05-01 MED ORDER — PHENYLEPHRINE HCL-NACL 20-0.9 MG/250ML-% IV SOLN
INTRAVENOUS | Status: DC | PRN
  Administered 2022-05-01: 30 ug/min via INTRAVENOUS

## 2022-05-01 MED ORDER — PROPOFOL 10 MG/ML IV BOLUS
INTRAVENOUS | Status: AC
  Filled 2022-05-01: qty 20

## 2022-05-01 MED ORDER — MIDAZOLAM HCL 2 MG/2ML IJ SOLN
INTRAMUSCULAR | Status: AC
  Filled 2022-05-01: qty 2

## 2022-05-01 MED ORDER — SUCCINYLCHOLINE CHLORIDE 200 MG/10ML IV SOSY
PREFILLED_SYRINGE | INTRAVENOUS | Status: DC | PRN
  Administered 2022-05-01: 100 mg via INTRAVENOUS

## 2022-05-01 MED ORDER — AMISULPRIDE (ANTIEMETIC) 5 MG/2ML IV SOLN: 10.0000 mg | Freq: Once | INTRAVENOUS | Status: DC | PRN

## 2022-05-01 MED ORDER — PROMETHAZINE HCL 25 MG/ML IJ SOLN: 6.2500 mg | INTRAMUSCULAR | Status: DC | PRN

## 2022-05-01 MED ORDER — MIDAZOLAM HCL 2 MG/2ML IJ SOLN
INTRAMUSCULAR | Status: DC | PRN
  Administered 2022-05-01: 2 mg via INTRAVENOUS

## 2022-05-01 MED ORDER — FENTANYL CITRATE (PF) 250 MCG/5ML IJ SOLN
INTRAMUSCULAR | Status: DC | PRN
  Administered 2022-05-01 (×2): 50 ug via INTRAVENOUS

## 2022-05-01 MED ORDER — HYDROMORPHONE HCL 1 MG/ML IJ SOLN: 0.2500 mg | INTRAMUSCULAR | Status: DC | PRN

## 2022-05-01 MED ORDER — FENTANYL CITRATE (PF) 250 MCG/5ML IJ SOLN
INTRAMUSCULAR | Status: AC
  Filled 2022-05-01: qty 5

## 2022-05-01 MED ORDER — ACETAMINOPHEN 500 MG PO TABS
1000.0000 mg | ORAL_TABLET | Freq: Once | ORAL | Status: AC
  Administered 2022-05-01: 1000 mg via ORAL
  Filled 2022-05-01: qty 2

## 2022-05-01 MED ORDER — LIDOCAINE HCL (CARDIAC) PF 100 MG/5ML IV SOSY
PREFILLED_SYRINGE | INTRAVENOUS | Status: DC | PRN
  Administered 2022-05-01: 80 mg via INTRATRACHEAL

## 2022-05-01 MED ORDER — LACTATED RINGERS IV SOLN: INTRAVENOUS | Status: DC

## 2022-05-01 MED ORDER — KETAMINE HCL 10 MG/ML IJ SOLN
INTRAMUSCULAR | Status: DC | PRN
  Administered 2022-05-01: 25 mg via INTRAVENOUS

## 2022-05-01 MED ORDER — PROPOFOL 1000 MG/100ML IV EMUL
INTRAVENOUS | Status: AC
  Filled 2022-05-01: qty 200

## 2022-05-01 MED ORDER — KETAMINE HCL 50 MG/5ML IJ SOSY
PREFILLED_SYRINGE | INTRAMUSCULAR | Status: AC
  Filled 2022-05-01: qty 5

## 2022-05-01 MED ORDER — MEPERIDINE HCL 25 MG/ML IJ SOLN: 6.2500 mg | INTRAMUSCULAR | Status: DC | PRN

## 2022-05-01 MED ORDER — BUPIVACAINE-EPINEPHRINE (PF) 0.25% -1:200000 IJ SOLN
INTRAMUSCULAR | Status: AC
  Filled 2022-05-01: qty 30

## 2022-05-01 MED ORDER — PROPOFOL 10 MG/ML IV BOLUS
INTRAVENOUS | Status: DC | PRN
  Administered 2022-05-01: 120 mg via INTRAVENOUS

## 2022-05-01 MED ORDER — THROMBIN 20000 UNITS EX SOLR
CUTANEOUS | Status: AC
  Filled 2022-05-01: qty 20000

## 2022-05-01 MED ORDER — PHENYLEPHRINE 80 MCG/ML (10ML) SYRINGE FOR IV PUSH (FOR BLOOD PRESSURE SUPPORT)
PREFILLED_SYRINGE | INTRAVENOUS | Status: DC | PRN
  Administered 2022-05-01: 160 ug via INTRAVENOUS

## 2022-05-01 MED ORDER — CHLORHEXIDINE GLUCONATE 0.12 % MT SOLN
15.0000 mL | Freq: Once | OROMUCOSAL | Status: AC
  Administered 2022-05-01: 15 mL via OROMUCOSAL
  Filled 2022-05-01: qty 15

## 2022-05-01 SURGICAL SUPPLY — 54 items
BAG COUNTER SPONGE SURGICOUNT (BAG) ×2 IMPLANT
BLADE CLIPPER SURG (BLADE) IMPLANT
BLADE SURG 10 STRL SS (BLADE) ×2 IMPLANT
CORD BIPOLAR FORCEPS 12FT (ELECTRODE) ×2 IMPLANT
COVER SURGICAL LIGHT HANDLE (MISCELLANEOUS) ×2 IMPLANT
DRAPE C-ARM 42X72 X-RAY (DRAPES) ×4 IMPLANT
DRAPE C-ARMOR (DRAPES) ×2 IMPLANT
DRAPE POUCH INSTRU U-SHP 10X18 (DRAPES) ×2 IMPLANT
DRAPE SURG 17X23 STRL (DRAPES) ×2 IMPLANT
DRAPE U-SHAPE 47X51 STRL (DRAPES) ×2 IMPLANT
DRSG MEPILEX BORDER 4X8 (GAUZE/BANDAGES/DRESSINGS) ×2 IMPLANT
DURAPREP 26ML APPLICATOR (WOUND CARE) ×2 IMPLANT
ELECT BLADE 4.0 EZ CLEAN MEGAD (MISCELLANEOUS) ×1
ELECT CAUTERY BLADE 6.4 (BLADE) ×2 IMPLANT
ELECT REM PT RETURN 9FT ADLT (ELECTROSURGICAL) ×1
ELECTRODE BLDE 4.0 EZ CLN MEGD (MISCELLANEOUS) ×2 IMPLANT
ELECTRODE REM PT RTRN 9FT ADLT (ELECTROSURGICAL) ×2 IMPLANT
GAUZE 4X4 16PLY ~~LOC~~+RFID DBL (SPONGE) ×4 IMPLANT
GLOVE BIO SURGEON STRL SZ7 (GLOVE) ×4 IMPLANT
GLOVE BIO SURGEON STRL SZ8 (GLOVE) ×2 IMPLANT
GLOVE BIOGEL PI IND STRL 7.0 (GLOVE) ×4 IMPLANT
GLOVE BIOGEL PI IND STRL 8 (GLOVE) ×2 IMPLANT
GLOVE SURG ENC MOIS LTX SZ6.5 (GLOVE) ×4 IMPLANT
GOWN STRL REUS W/ TWL LRG LVL3 (GOWN DISPOSABLE) ×6 IMPLANT
GOWN STRL REUS W/TWL LRG LVL3 (GOWN DISPOSABLE) ×3
KIT BASIN OR (CUSTOM PROCEDURE TRAY) ×2 IMPLANT
KIT DILATOR XLIF 5 (KITS) ×1 IMPLANT
KIT SURGICAL ACCESS MAXCESS 4 (KITS) ×1 IMPLANT
KIT TURNOVER KIT B (KITS) ×2 IMPLANT
MODULE NVM5 NEXT GEN EMG (NEEDLE) ×1 IMPLANT
NDL HYPO 25GX1X1/2 BEV (NEEDLE) ×1 IMPLANT
NEEDLE HYPO 25GX1X1/2 BEV (NEEDLE) ×1 IMPLANT
NS IRRIG 1000ML POUR BTL (IV SOLUTION) ×2 IMPLANT
PACK LAMINECTOMY ORTHO (CUSTOM PROCEDURE TRAY) ×2 IMPLANT
PACK UNIVERSAL I (CUSTOM PROCEDURE TRAY) ×2 IMPLANT
PAD ARMBOARD 7.5X6 YLW CONV (MISCELLANEOUS) ×4 IMPLANT
SPONGE INTESTINAL PEANUT (DISPOSABLE) ×4 IMPLANT
SPONGE SURGIFOAM ABS GEL 100 (HEMOSTASIS) ×1 IMPLANT
SPONGE T-LAP 4X18 ~~LOC~~+RFID (SPONGE) ×2 IMPLANT
STRIP CLOSURE SKIN 1/2X4 (GAUZE/BANDAGES/DRESSINGS) IMPLANT
SURGIFLO W/THROMBIN 8M KIT (HEMOSTASIS) IMPLANT
SUT MNCRL AB 4-0 PS2 18 (SUTURE) ×2 IMPLANT
SUT PROLENE 5 0 C 1 24 (SUTURE) ×1 IMPLANT
SUT VIC AB 1 CT1 27 (SUTURE) ×1
SUT VIC AB 1 CT1 27XBRD ANBCTR (SUTURE) ×3 IMPLANT
SUT VIC AB 2-0 CP2 18 (SUTURE) ×1 IMPLANT
SUT VIC AB 2-0 CT2 18 VCP726D (SUTURE) ×4 IMPLANT
SYR BULB IRRIG 60ML STRL (SYRINGE) ×2 IMPLANT
TAPE CLOTH SURG 6X10 WHT LF (GAUZE/BANDAGES/DRESSINGS) ×4 IMPLANT
TOWEL GREEN STERILE (TOWEL DISPOSABLE) ×2 IMPLANT
TOWEL GREEN STERILE FF (TOWEL DISPOSABLE) ×2 IMPLANT
TRAY FOLEY MTR SLVR 16FR STAT (SET/KITS/TRAYS/PACK) ×2 IMPLANT
WATER STERILE IRR 1000ML POUR (IV SOLUTION) ×2 IMPLANT
YANKAUER SUCT BULB TIP NO VENT (SUCTIONS) ×2 IMPLANT

## 2022-05-01 NOTE — Anesthesia Postprocedure Evaluation (Signed)
Anesthesia Post Note  Patient: Kyle [name]  Procedure(s) Performed: CANCELLED PROCEDURE     Patient location during evaluation: PACU Anesthesia Type: General Level of consciousness: awake and alert Pain management: pain level controlled Vital Signs Assessment: post-procedure vital signs reviewed and stable Respiratory status: spontaneous breathing Cardiovascular status: stable Anesthetic complications: no Comments: Pt converted to a-flutter in OR with variable AV conduction. He has a few that were 5-6:1 A-V conduction intermittently. Then, he went into the 30's and was hypotensive. The decision was made to abort at that time. He was taken to PACU and recovered without issue. EKG was rate controlled a-flutter with intermittent AV conduction. Discussed atrial flutter in OR and need for cardiology evaluation with Mr. Vanderzanden and his wife. They expressed understanding. I reached out to Dr. Edilia Bo, Mr. Gershon PCP to inform him of the event. Cardiology referral is being made.    No notable events documented.  Last Vitals:  Vitals:   05/01/22 1030 05/01/22 1045  BP: (!) 145/94 (!) 141/80  Pulse: 67 68  Resp: 16 17  Temp:  36.6 C  SpO2: 99% 96%    Last Pain:  Vitals:   05/01/22 1030  TempSrc:   PainSc: 0-No pain                 Nolon Nations

## 2022-05-01 NOTE — Telephone Encounter (Signed)
Can you call and tell [name] that I have referred him to Cardiology.  Sorry that he is going through all of this.

## 2022-05-01 NOTE — Anesthesia Procedure Notes (Signed)
Procedure Name: Intubation Date/Time: 05/01/2022 8:54 AM  Performed by: Elvin So, CRNAPre-anesthesia Checklist: Patient identified, Emergency Drugs available, Suction available and Patient being monitored Patient Re-evaluated:Patient Re-evaluated prior to induction Oxygen Delivery Method: Circle System Utilized Preoxygenation: Pre-oxygenation with 100% oxygen Induction Type: IV induction Ventilation: Mask ventilation without difficulty Laryngoscope Size: Mac and 4 Grade View: Grade I Tube type: Oral Tube size: 7.5 mm Number of attempts: 1 Airway Equipment and Method: Stylet and Oral airway Placement Confirmation: ETT inserted through vocal cords under direct vision, positive ETCO2 and breath sounds checked- equal and bilateral Secured at: 22 cm Tube secured with: Tape Dental Injury: Teeth and Oropharynx as per pre-operative assessment
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
## 2022-05-01 NOTE — Telephone Encounter (Signed)
Kyle [name] notified as instructed by telephone.  He has already be called and is scheduled to see Dr. Acie Fredrickson on 05/06/2022 at 1:00 pm.   FYI to Dr. Lorelei Pont.

## 2022-05-01 NOTE — Progress Notes (Signed)
While performing a timeout, immediately prior to making an incision, the patient was noted to be in atrial flutter, after which point he became bradycardic, and his blood pressure dropped into the 60s/40s.  This was immediately identified by his CRNA, Keenan Bachelor, and Dr. Lissa Hoard.  The decision was made to abort proceeding with surgery.  I spoke with the patient's wife and updated her.  Dr. Lissa Hoard will also be speaking with her.  The plan at this point is to have the patient follow-up with a cardiologist, and subsequent to that, he and I will discuss whether we make another attempt at proceeding with the surgery that was planned for today.

## 2022-05-01 NOTE — Telephone Encounter (Signed)
Pt called stating he was scheduled for back surgery this morning, 3/6 & the surgery was cancelled due to some heart issues the pt was having. Pt states he's never had heart issues before & was suggested to reach out to Dr. Lorelei Pont for a referral. Call back # [phone]

## 2022-05-01 NOTE — Transfer of Care (Signed)
Immediate Anesthesia Transfer of Care Note  Patient: Kyle [name]  Procedure(s) Performed: CANCELLED LEFT-SIDED LUMBAR 2- LUMBAR 3, LUMBAR 3- LUMBAR 4, LUMBAR 4- LUMBAR 5 LATERAL INTERBODY FUSION WITH INSTRUMENTATION AND ALLOGRAFT (Left)  Patient Location: PACU  Anesthesia Type:General  Level of Consciousness: awake and patient cooperative  Airway & Oxygen Therapy: Patient Spontanous Breathing and Patient connected to face mask oxygen  Post-op Assessment: Report given to RN, Post -op Vital signs reviewed and stable, and Patient moving all extremities  Post vital signs: Reviewed and stable  Last Vitals:  Vitals Value Taken Time  BP 145/90 05/01/22 0941  Temp 36.6 C 05/01/22 0941  Pulse 68 05/01/22 0958  Resp 17 05/01/22 0958  SpO2 94 % 05/01/22 0958  Vitals shown include unvalidated device data.  Last Pain:  Vitals:   05/01/22 0941  TempSrc:   PainSc: 0-No pain      Patients Stated Pain Goal: 0 (0000000 [id number])  Complications: No notable events documented.

## 2022-05-02 ENCOUNTER — Inpatient Hospital Stay (HOSPITAL_COMMUNITY)
Admission: RE | Admit: 2022-05-02 | Payer: No Typology Code available for payment source | Source: Ambulatory Visit | Admitting: Orthopedic Surgery

## 2022-05-02 SURGERY — POSTERIOR LUMBAR FUSION 4 LEVEL
Anesthesia: General

## 2022-05-05 ENCOUNTER — Encounter: Payer: Self-pay | Admitting: Cardiovascular Disease
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
## 2022-05-06 ENCOUNTER — Ambulatory Visit
Payer: No Typology Code available for payment source | Attending: Cardiovascular Disease | Admitting: Cardiovascular Disease

## 2022-05-06 ENCOUNTER — Encounter: Payer: Self-pay | Admitting: Cardiovascular Disease

## 2022-05-06 ENCOUNTER — Telehealth: Payer: Self-pay

## 2022-05-06 VITALS — BP 138/80 | HR 74 | Ht 67.0 in | Wt 191.6 lb

## 2022-05-06 DIAGNOSIS — Z01810 Encounter for preprocedural cardiovascular examination: Secondary | ICD-10-CM

## 2022-05-06 DIAGNOSIS — I483 Typical atrial flutter: Secondary | ICD-10-CM

## 2022-05-06 DIAGNOSIS — R0683 Snoring: Secondary | ICD-10-CM

## 2022-05-06 MED ORDER — APIXABAN 5 MG PO TABS: 5.0000 mg | ORAL_TABLET | Freq: Two times a day (BID) | ORAL | 11 refills | Status: DC

## 2022-05-06 NOTE — Patient Instructions (Signed)
Medication Instructions:  START Eliquis '5mg'$  twice daily *If you need a refill on your cardiac medications before your next appointment, please call your pharmacy*   Lab Work: TSH today BMET CBC one week prior to procedure  If you have labs (blood work) drawn today and your tests are completely normal, you will receive your results only by: [place] [employer] (if you have MyChart) OR A paper copy in the mail If you have any lab test that is abnormal or we need to change your treatment, we will call you to review the results.   Testing/Procedures: ECHO Your physician has requested that you have an echocardiogram. Echocardiography is a painless test that uses sound waves to create images of your heart. It provides your doctor with information about the size and shape of your heart and how well your heart's chambers and valves are working. This procedure takes approximately one hour. There are no restrictions for this procedure. Please do NOT wear cologne, perfume, aftershave, or lotions (deodorant is allowed). Please arrive 15 minutes prior to your appointment time.  Itamar sleep study Your physician has recommended that you have a sleep study. This test records several body functions during sleep, including: brain activity, eye movement, oxygen and carbon dioxide blood levels, heart rate and rhythm, breathing rate and rhythm, the flow of air through your mouth and nose, snoring, body muscle movements, and chest and belly movement.  Follow-Up: At [facility], you and your health needs are our priority.  As part of our continuing mission to provide you with exceptional heart care, we have created designated Provider Care Teams.  These Care Teams include your primary Cardiologist (physician) and Advanced Practice Providers (APPs -  Physician Assistants and Nurse Practitioners) who all work together to provide you with the care you need, when you need it.  Your next appointment:   3  month(s)  Provider:   Mertie Moores, MD     Dear Kyle [name]  You are scheduled for a Cardioversion on Thursday, April 4 with Dr. Harriet Masson.  Please arrive at the [facility] (Main Entrance A) at [facility]: [address] [place], [place] [address] at 7:00 AM.  DIET: Nothing to eat or drink after midnight except a sip of water with medications (see medication instructions below) MEDICATION INSTRUCTIONS: Continue taking your anticoagulant (blood thinner): Apixaban (Eliquis).  You will need to continue this after your procedure until you are told by your provider that it is safe to stop.   LABS: BMET, CBC one week prior to procedure (3/25-4/3) FYI: You must have a responsible person to drive you home and stay in the waiting area during your procedure. Failure to do so could result in cancellation. Bring your insurance cards. *Special Note: Every effort is made to have your procedure done on time. Occasionally there are emergencies that occur at the hospital that may cause delays. Please be patient if a delay does occur.

## 2022-05-06 NOTE — Telephone Encounter (Signed)
Patient was seen in office today by Dr. Acie Fredrickson who ordered a sleep study. Patient agreeable to signed waiver and not to open box until he is given the PIN#

## 2022-05-07 LAB — TSH: TSH: 3.16 u[IU]/mL (ref 0.450–4.500)

## 2022-05-07 NOTE — Telephone Encounter (Signed)
Thank you Kyle [name] for the note.

## 2022-05-10 NOTE — Telephone Encounter (Signed)
Prior Authorization for [facility] sent to [employer] via web portal. Tracking Number . [id number] REQ

## 2022-05-10 NOTE — Telephone Encounter (Signed)
Pt has been given PIN# [id number] and ok to proceed. Pt states he will do sleep study this weekend. Pt asked me to please go over instructions on how to use the sleep study and what to do after he is done. I went over all instructions on how to use the sleep study. Pt thanked me for the help.   Called and made the patient aware that he may proceed with the [facility] Sleep Study. PIN # provided to the patient. Patient made aware that he will be contacted after the test has been read with the results and any recommendations. Patient verbalized understanding and thanked me for the call.

## 2022-05-12 ENCOUNTER — Encounter (INDEPENDENT_AMBULATORY_CARE_PROVIDER_SITE_OTHER): Payer: No Typology Code available for payment source | Admitting: Cardiology

## 2022-05-12 DIAGNOSIS — G4733 Obstructive sleep apnea (adult) (pediatric): Secondary | ICD-10-CM

## 2022-05-14 ENCOUNTER — Ambulatory Visit (HOSPITAL_COMMUNITY): Payer: No Typology Code available for payment source | Attending: Internal Medicine

## 2022-05-14 DIAGNOSIS — I483 Typical atrial flutter: Secondary | ICD-10-CM | POA: Insufficient documentation

## 2022-05-14 DIAGNOSIS — I371 Nonrheumatic pulmonary valve insufficiency: Secondary | ICD-10-CM | POA: Diagnosis not present

## 2022-05-14 LAB — ECHOCARDIOGRAM COMPLETE
AR max vel: 2.52 cm2
AV Area VTI: 2.45 cm2
AV Area mean vel: 2.53 cm2
AV Mean grad: 8.3 mmHg
AV Peak grad: 15.7 mmHg
Ao pk vel: 1.98 m/s
Area-P 1/2: 3.76 cm2
S' Lateral: 2.4 cm

## 2022-05-15 ENCOUNTER — Telehealth: Payer: Self-pay | Admitting: Cardiovascular Disease

## 2022-05-15 ENCOUNTER — Telehealth: Payer: Self-pay | Admitting: *Deleted

## 2022-05-15 ENCOUNTER — Ambulatory Visit: Payer: No Typology Code available for payment source | Attending: Cardiovascular Disease

## 2022-05-15 DIAGNOSIS — I483 Typical atrial flutter: Secondary | ICD-10-CM

## 2022-05-15 DIAGNOSIS — G4733 Obstructive sleep apnea (adult) (pediatric): Secondary | ICD-10-CM

## 2022-05-15 NOTE — Telephone Encounter (Signed)
The patient has been notified of the result. Left detailed message on voicemail and informed patient to call back..Kyle [name], CMA   

## 2022-05-15 NOTE — Telephone Encounter (Signed)
-----   Message from Lauralee Evener, [place] sent at 05/15/2022  9:03 AM EDT -----  ----- Message ----- From: Sueanne Margarita, MD Sent: 05/15/2022   8:59 AM EDT To: [facility] Sleep Studies  Please let patient know that they have sleep apnea and recommend treating with CPAP.  Please order an auto CPAP from 4-15cm H2O with heated humidity and mask of choice>> THIS NEEDS TO BE DONE ASAP and Patient NEEDS CPAP STARTED BEFORE HIS CARDIOVERSION IN EARLY APRIL.  Order overnight pulse ox on CPAP.  Followup with me in 6 weeks.

## 2022-05-15 NOTE — Telephone Encounter (Signed)
-----   Message from Thayer Headings, MD sent at 05/15/2022 10:36 AM EDT ----- Normal LVEF of 60-65% Trivial aortic insufficiency

## 2022-05-15 NOTE — Telephone Encounter (Signed)
Pt would like a callback regarding Echo results. Please advise 

## 2022-05-15 NOTE — Telephone Encounter (Signed)
Return Call: The patient has been notified of the result and verbalized understanding.  All questions (if any) were answered. Marolyn Hammock, [place] 05/15/2022 4:52 PM    Upon patient request DME selection is [facility]. Patient understands he will be contacted by [facility] to set up his cpap. Patient understands to call if [facility] does not contact him with new setup in a timely manner. Patient understands they will be called once confirmation has been received from [employer]/ that they have received their new machine to schedule 10 week follow up appointment.   [facility] notified of new cpap order  Please add to airview Patient was grateful for the call and thanked me.

## 2022-05-15 NOTE — Procedures (Signed)
SLEEP STUDY REPORT Patient Information Study Date: 05/12/2022 Patient Name: Kyle [name] Patient ID: [id number] Birth Date: 04-13-1945 Age: 77 Gender: Male BMI: 30.1 (W=192 lb, H=5' 7'') Referring Physician: Mertie Moores, MD  TEST DESCRIPTION: Home sleep apnea testing was completed using the WatchPat, a Type 1 device, utilizing peripheral arterial tonometry (PAT), chest movement, actigraphy, pulse oximetry, pulse rate, body position and snore. AHI was calculated with apnea and hypopnea using valid sleep time as the denominator. RDI includes apneas, hypopneas, and RERAs. The data acquired and the scoring of sleep and all associated events were performed in accordance with the recommended standards and specifications as outlined in the AASM Manual for the Scoring of Sleep and Associated Events 2.2.0 (2015).   FINDINGS:   1. Mild Obstructive Sleep Apnea with AHI 11.7/hr.   2. None Central Sleep Apnea with pAHIc 1.7/hr.   3. Oxygen desaturations as low as 88%.   4. Mild snoring was present. O2 sats were < 88% for 0.2 min.   5. Total sleep time was 5 hrs and 58 min.   6.16.2% of total sleep time was spent in REM sleep.   7. Normal sleep onset latency at 22 min.   8. Shortened REM sleep onset latency at 43 min.   9. Total awakenings were 12.  10. Arrhythmia detection:  Suggestive of possible brief atrial fibrillation lasting 5 hr 51 min and 10 secs.  This is not diagnostic and further testing with outpatient telemetry monitoring is recommended.  DIAGNOSIS: Mild Obstructive Sleep Apnea (G47.33) Possible Atrial Fibrillation/Flutter  RECOMMENDATIONS:   1.  Clinical correlation of these findings is necessary.  The decision to treat obstructive sleep apnea (OSA) is usually based on the presence of apnea symptoms or the presence of associated medical conditions such as Hypertension, Congestive Heart Failure, Atrial Fibrillation or Obesity.  The most common symptoms of OSA are snoring,  gasping for breath while sleeping, daytime sleepiness and fatigue.   2.  Initiating apnea therapy is recommended given the presence of symptoms and/or associated conditions. Recommend proceeding with one of the following:     a.  Auto-CPAP therapy with a pressure range of 5-20cm H2O.     b.  An oral appliance (OA) that can be obtained from certain dentists with expertise in sleep medicine.  These are primarily of use in non-obese patients with mild and moderate disease.     c.  An ENT consultation which may be useful to look for specific causes of obstruction and possible treatment options.     d.  If patient is intolerant to PAP therapy, consider referral to ENT for evaluation for hypoglossal nerve stimulator.   3.  Close follow-up is necessary to ensure success with CPAP or oral appliance therapy for maximum benefit.  4.  A follow-up oximetry study on CPAP is recommended to assess the adequacy of therapy and determine the need for supplemental oxygen or the potential need for Bi-level therapy.  An arterial blood gas to determine the adequacy of baseline ventilation and oxygenation should also be considered.  5.  Healthy sleep recommendations include:  adequate nightly sleep (normal 7-9 hrs/night), avoidance of caffeine after noon and alcohol near bedtime, and maintaining a sleep environment that is cool, dark and quiet.  6.  Weight loss for overweight patients is recommended.  Even modest amounts of weight loss can significantly improve the severity of sleep apnea.  7.  Snoring recommendations include:  weight loss where appropriate, side sleeping, and avoidance of  alcohol before bed.  8.  Operation of motor vehicle should be avoided when sleepy.  Signature: Fransico Him, MD; [facility]; [place], [place] Board of Sleep Medicine Electronically Signed: 05/15/2022

## 2022-05-15 NOTE — Telephone Encounter (Signed)
I spoke with patient and reviewed echo results with him 

## 2022-05-21 ENCOUNTER — Ambulatory Visit: Payer: No Typology Code available for payment source | Attending: Cardiovascular Disease

## 2022-05-21 ENCOUNTER — Other Ambulatory Visit: Payer: Self-pay | Admitting: *Deleted

## 2022-05-21 DIAGNOSIS — Z01810 Encounter for preprocedural cardiovascular examination: Secondary | ICD-10-CM

## 2022-05-21 DIAGNOSIS — I483 Typical atrial flutter: Secondary | ICD-10-CM

## 2022-05-21 LAB — CBC
Hematocrit: 42.7 % (ref 37.5–51.0)
Hemoglobin: 14.5 g/dL (ref 13.0–17.7)
MCH: 31.5 pg (ref 26.6–33.0)
MCHC: 34 g/dL (ref 31.5–35.7)
MCV: 93 fL (ref 79–97)
Platelets: 306 10*3/uL (ref 150–450)
RBC: 4.6 x10E6/uL (ref 4.14–5.80)
RDW: 13.1 % (ref 11.6–15.4)
WBC: 12.6 10*3/uL — ABNORMAL HIGH (ref 3.4–10.8)

## 2022-05-21 LAB — BASIC METABOLIC PANEL
BUN/Creatinine Ratio: 14 (ref 10–24)
BUN: 14 mg/dL (ref 8–27)
CO2: 28 mmol/L (ref 20–29)
Calcium: 9.7 mg/dL (ref 8.6–10.2)
Chloride: 101 mmol/L (ref 96–106)
Creatinine, Ser: 0.98 mg/dL (ref 0.76–1.27)
Glucose: 94 mg/dL (ref 70–99)
Potassium: 4.5 mmol/L (ref 3.5–5.2)
Sodium: 137 mmol/L (ref 134–144)
eGFR: 80 mL/min/{1.73_m2} (ref >59)

## 2022-05-27 DIAGNOSIS — G4733 Obstructive sleep apnea (adult) (pediatric): Secondary | ICD-10-CM | POA: Diagnosis not present

## 2022-05-27 DIAGNOSIS — I483 Typical atrial flutter: Secondary | ICD-10-CM | POA: Diagnosis not present

## 2022-05-30 ENCOUNTER — Ambulatory Visit (HOSPITAL_BASED_OUTPATIENT_CLINIC_OR_DEPARTMENT_OTHER): Payer: No Typology Code available for payment source | Admitting: Certified Registered Nurse Anesthetist

## 2022-05-30 ENCOUNTER — Ambulatory Visit (HOSPITAL_COMMUNITY)
Admission: RE | Admit: 2022-05-30 | Discharge: 2022-05-30 | Disposition: A | Discharge status: Home / Self Care | Payer: No Typology Code available for payment source | Attending: Cardiology | Admitting: Cardiology

## 2022-05-30 ENCOUNTER — Encounter (HOSPITAL_COMMUNITY): Payer: Self-pay | Admitting: Cardiology

## 2022-05-30 ENCOUNTER — Encounter (HOSPITAL_COMMUNITY)
Admission: RE | Disposition: A | Discharge status: Home / Self Care | Payer: Self-pay | Source: Home / Self Care | Attending: Cardiology

## 2022-05-30 ENCOUNTER — Ambulatory Visit (HOSPITAL_COMMUNITY): Payer: No Typology Code available for payment source | Admitting: Certified Registered Nurse Anesthetist

## 2022-05-30 DIAGNOSIS — Z7901 Long term (current) use of anticoagulants: Secondary | ICD-10-CM | POA: Insufficient documentation

## 2022-05-30 DIAGNOSIS — I483 Typical atrial flutter: Secondary | ICD-10-CM

## 2022-05-30 DIAGNOSIS — I4892 Unspecified atrial flutter: Secondary | ICD-10-CM | POA: Diagnosis not present

## 2022-05-30 DIAGNOSIS — I4891 Unspecified atrial fibrillation: Secondary | ICD-10-CM | POA: Diagnosis not present

## 2022-05-30 HISTORY — PX: CARDIOVERSION: SHX1299

## 2022-05-30 SURGERY — CARDIOVERSION
Anesthesia: General

## 2022-05-30 MED ORDER — SODIUM CHLORIDE 0.9 % IV SOLN: INTRAVENOUS | Status: DC

## 2022-05-30 MED ORDER — PROPOFOL 10 MG/ML IV BOLUS
INTRAVENOUS | Status: DC | PRN
  Administered 2022-05-30: 30 mg via INTRAVENOUS

## 2022-05-30 MED ORDER — LIDOCAINE 2% (20 MG/ML) 5 ML SYRINGE
INTRAMUSCULAR | Status: DC | PRN
  Administered 2022-05-30: 50 mg via INTRAVENOUS

## 2022-05-30 NOTE — Anesthesia Procedure Notes (Signed)
Procedure Name: General with mask airway Date/Time: 05/30/2022 8:06 AM  Performed by: Maude Leriche, CRNAPre-anesthesia Checklist: Patient identified, Emergency Drugs available, Suction available, Patient being monitored and Timeout performed Patient Re-evaluated:Patient Re-evaluated prior to induction Oxygen Delivery Method: Ambu bag Preoxygenation: Pre-oxygenation with 100% oxygen Induction Type: IV induction Placement Confirmation: positive ETCO2 Dental Injury: Teeth and Oropharynx as per pre-operative assessment

## 2022-05-30 NOTE — Transfer of Care (Signed)
Immediate Anesthesia Transfer of Care Note  Patient: Kyle [name]  Procedure(s) Performed: CARDIOVERSION  Patient Location: Endoscopy Unit  Anesthesia Type:General  Level of Consciousness: awake, alert , and oriented  Airway & Oxygen Therapy: Patient Spontanous Breathing  Post-op Assessment: Report given to RN, Post -op Vital signs reviewed and stable, Patient moving all extremities X 4, and Patient able to stick tongue midline  Post vital signs: Reviewed  Last Vitals:  Vitals Value Taken Time  BP 131/80   Temp 97.6   Pulse 78   Resp 19   SpO2 100     Last Pain:  Vitals:   05/30/22 0703  TempSrc: Temporal  PainSc: 0-No pain         Complications: No notable events documented.

## 2022-05-30 NOTE — Anesthesia Postprocedure Evaluation (Signed)
Anesthesia Post Note  Patient: Kyle [name]  Procedure(s) Performed: CARDIOVERSION     Patient location during evaluation: PACU Anesthesia Type: General Level of consciousness: awake and alert Pain management: pain level controlled Vital Signs Assessment: post-procedure vital signs reviewed and stable Respiratory status: spontaneous breathing, nonlabored ventilation, respiratory function stable and patient connected to nasal cannula oxygen Cardiovascular status: blood pressure returned to baseline and stable Postop Assessment: no apparent nausea or vomiting Anesthetic complications: no  No notable events documented.  Last Vitals:  Vitals:   05/30/22 0820 05/30/22 0830  BP: 117/75 121/67  Pulse: 71 66  Resp: 15 19  Temp:    SpO2: 97% 97%    Last Pain:  Vitals:   05/30/22 0830  TempSrc:   PainSc: 0-No pain                 Nixon Kolton S

## 2022-05-30 NOTE — Interval H&P Note (Signed)
History and Physical Interval Note:  05/30/2022 7:39 AM  Kyle [name]  has presented today for surgery, with the diagnosis of AFLUTTER.  The various methods of treatment have been discussed with the patient and family. After consideration of risks, benefits and other options for treatment, the patient has consented to  Procedure(s): CARDIOVERSION (N/A) as a surgical intervention.  The patient's history has been reviewed, patient examined, no change in status, stable for surgery.  I have reviewed the patient's chart and labs.  Questions were answered to the patient's satisfaction.     Bernard Slayden

## 2022-05-30 NOTE — CV Procedure (Signed)
   Electrical Cardioversion Procedure Note Kyle [name] [id number] 05/31/1945  Procedure: Electrical Cardioversion Indications:  Atrial Fibrillation  Time Out: Verified patient identification, verified procedure,medications/allergies/relevent history reviewed, required imaging and test results available.  Performed  Procedure Details  The patient was NPO after midnight. Anesthesia was administered at the beside  by Dr.Rosen.  Cardioversion was done with synchronized biphasic defibrillation with AP pads with 200 Joules x1 shock.  The patient converted to normal sinus rhythm. The patient tolerated the procedure well   IMPRESSION:  Successful cardioversion of atrial fibrillation to sinus rhythm with Pac.    Kyle [name] 05/30/2022, 8:10 AM

## 2022-05-30 NOTE — Anesthesia Preprocedure Evaluation (Addendum)
Anesthesia Evaluation  Patient identified by MRN, date of birth, ID band Patient awake    Reviewed: Allergy & Precautions, H&P , NPO status , Patient's Chart, lab work & pertinent test results  Airway Mallampati: II  TM Distance: >3 FB Neck ROM: Full    Dental no notable dental hx.    Pulmonary neg pulmonary ROS   Pulmonary exam normal breath sounds clear to auscultation       Cardiovascular Normal cardiovascular exam+ dysrhythmias Atrial Fibrillation  Rhythm:Irregular Rate:Normal  . Left ventricular ejection fraction, by estimation, is 60 to 65%. The  left ventricle has normal function. The left ventricle has no regional  wall motion abnormalities. There is mild concentric left ventricular  hypertrophy. Left ventricular diastolic  parameters are indeterminate.   2. Right ventricular systolic function is normal. The right ventricular  size is normal. Tricuspid regurgitation signal is inadequate for assessing  PA pressure.   3. Left atrial size was moderately dilated.   4. The mitral valve is degenerative. No evidence of mitral valve  regurgitation. Moderate mitral annular calcification.   5. Aortic valve regurgitation is trivial. Aortic valve  sclerosis/calcification is present, without any evidence of aortic  stenosis.   6. The inferior vena cava is normal in size with greater than 50%  respiratory variability, suggesting right atrial pressure of 3 mmHg.      Neuro/Psych negative neurological ROS  negative psych ROS   GI/Hepatic negative GI ROS, Neg liver ROS,,,  Endo/Other  negative endocrine ROS    Renal/GU negative Renal ROS  negative genitourinary   Musculoskeletal negative musculoskeletal ROS (+)    Abdominal   Peds negative pediatric ROS (+)  Hematology negative hematology ROS (+)   Anesthesia Other Findings   Reproductive/Obstetrics negative OB ROS                              Anesthesia Physical Anesthesia Plan  ASA: 2  Anesthesia Plan: General   Post-op Pain Management: Minimal or no pain anticipated   Induction: Intravenous  PONV Risk Score and Plan: 2 and Ondansetron, Dexamethasone and Treatment may vary due to age or medical condition  Airway Management Planned: Mask  Additional Equipment:   Intra-op Plan:   Post-operative Plan: Extubation in OR  Informed Consent: I have reviewed the patients History and Physical, chart, labs and discussed the procedure including the risks, benefits and alternatives for the proposed anesthesia with the patient or authorized representative who has indicated his/her understanding and acceptance.     Dental advisory given  Plan Discussed with: CRNA and Surgeon  Anesthesia Plan Comments:        Anesthesia Quick Evaluation

## 2022-05-30 NOTE — Discharge Instructions (Signed)

## 2022-06-03 ENCOUNTER — Encounter (HOSPITAL_COMMUNITY): Payer: Self-pay | Admitting: Cardiology

## 2022-06-04 ENCOUNTER — Telehealth: Payer: Self-pay | Admitting: Cardiovascular Disease

## 2022-06-04 NOTE — Telephone Encounter (Signed)
Returned call to patient.  Patient would like to know if he needs to see Dr. Elease Hashimoto prior to rescheduling his back surgery, he asked about clearance for surgery. Advised patient to speak with his surgeon's office and have them fax over a request for surgical clearance to [place] at [phone].  Patient states he is now on a CPAP machine and has had it for 2 weeks now, states he is doing well with the CPAP.  He reports he had his cardioversion on 05/30/22 and converted back to NSR.  Patient would like to know if Dr. Elease Hashimoto could tell him how likely it might be for him to go back into a-flutter during induction for surgery. He is worried about this reoccurring. He is curious to know how often Dr. Elease Hashimoto has heard of this happening.  Will forward to Dr. Elease Hashimoto and his nurse to review and follow-up.

## 2022-06-04 NOTE — Telephone Encounter (Signed)
Patient states he is returning another call. He states he accidentally ended the call.

## 2022-06-04 NOTE — Telephone Encounter (Signed)
Calling to speak with the nurse about his afib. Please advise

## 2022-06-10 NOTE — Telephone Encounter (Signed)
Returned call to patient to provide Dr Harvie Bridge recommendations. He had multiple questions regarding anesthesia. Advised that he re-direct these to surgeon's office regarding the agent they'll used and response time, etc. Answered overall sedation guideline questions.

## 2022-06-11 DIAGNOSIS — H524 Presbyopia: Secondary | ICD-10-CM | POA: Diagnosis not present

## 2022-06-24 ENCOUNTER — Encounter: Payer: Self-pay | Admitting: Cardiology

## 2022-06-26 DIAGNOSIS — I483 Typical atrial flutter: Secondary | ICD-10-CM | POA: Diagnosis not present

## 2022-06-26 DIAGNOSIS — G4733 Obstructive sleep apnea (adult) (pediatric): Secondary | ICD-10-CM | POA: Diagnosis not present

## 2022-06-28 ENCOUNTER — Other Ambulatory Visit: Payer: Self-pay | Admitting: Orthopedic Surgery

## 2022-07-03 ENCOUNTER — Telehealth: Payer: Self-pay | Admitting: Cardiovascular Disease

## 2022-07-03 ENCOUNTER — Ambulatory Visit: Payer: No Typology Code available for payment source | Admitting: Family Medicine

## 2022-07-03 ENCOUNTER — Encounter: Payer: Self-pay | Admitting: Family Medicine

## 2022-07-03 VITALS — BP 124/72 | HR 84 | Temp 99.0°F | Ht 67.75 in | Wt 192.4 lb

## 2022-07-03 DIAGNOSIS — R079 Chest pain, unspecified: Secondary | ICD-10-CM

## 2022-07-03 DIAGNOSIS — I483 Typical atrial flutter: Secondary | ICD-10-CM

## 2022-07-03 MED ORDER — ISOSORBIDE MONONITRATE ER 30 MG PO TB24
30.0000 mg | ORAL_TABLET | Freq: Every day | ORAL | 3 refills | Status: DC
Start: 2022-07-03 — End: 2022-08-14

## 2022-07-03 MED ORDER — NITROGLYCERIN 0.4 MG SL SUBL
SUBLINGUAL_TABLET | SUBLINGUAL | 6 refills | Status: DC
Start: 2022-07-03 — End: 2022-12-13
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
## 2022-07-03 NOTE — Telephone Encounter (Signed)
-----   Message from Vesta Mixer, MD sent at 07/03/2022  5:23 PM EDT ----- Regarding: FW: exertional chest pain Symptoms are concerning for Unstable angina   Please start on Imdur 30 mg a day  NTG 0.4 mg SL PRN See if he can be worked into a DOD slot  As I am out until Wednesday   ER precautions if the symptoms do not resolve    PN   ----- Message ----- From: Hannah Beat, MD Sent: 07/03/2022   5:03 PM EDT To: Vesta Mixer, MD Subject: exertional chest pain                          Phil,   I know you just saw this patient recently, but he has been having new exertional chest pain for the last 2 weeks very frequently.  It will last 15-20 minutes, and it resolves with rest.  I worry that he may be having some angina, so I re-referred him to your office.   He had a cardioversion about a month ago for A. Flutter.   I hope that you and your family are well.   Karleen Hampshire

## 2022-07-03 NOTE — Telephone Encounter (Signed)
Called and spoke with patient to review medications and need for OV. Pt scheduled as DOD add-in on Monday 07/08/22

## 2022-07-04 NOTE — Progress Notes (Signed)
Cardiology Office Note:    Date:  07/08/2022   ID:  Kyle [name], DOB 1946-01-10, MRN [id number]  PCP:  Hannah Beat, MD   [facility] Providers Cardiologist:  Alverda Skeans, MD (DOD VISIT) Primary cardiologist: Delane Ginger, MD Referring MD: Hannah Beat, MD   Chief Complaint/Reason for Referral:  Chest pain (DOD)  ASSESSMENT:    1. Precordial pain   2. Typical atrial flutter (HCC)     PLAN:    In order of problems listed above: 1.  Chest pain: I believe this is likely related to the patient's recurrent atrial flutter but given the GERD like component we will start an empiric PPI.  Because of his exertional symptoms we will obtain a Lexiscan stress test.  Only if he has high risk findings would any further evaluation be required.  Will have the patient see EP for further recommendations given recurrent atrial flutter.  He patient has follow-up with Dr. Elease Hashimoto scheduled in June. 2.  Atrial flutter: Tolerating apixaban.  EP consult with Dr. Nelly Laurence tomorrow.  The patient does have back surgery scheduled for early June.  Depending on the timing of ablation this may need to be rescheduled.                 Medication Adjustments/Labs and Tests Ordered: Current medicines are reviewed at length with the patient today.  Concerns regarding medicines are outlined above.  The following changes have been made:  no change   Labs/tests ordered: Orders Placed This Encounter  Procedures   Ambulatory referral to Cardiac Electrophysiology   Cardiac Stress Test: Informed Consent Details: Physician/Practitioner Attestation; Transcribe to consent form and obtain patient signature   MYOCARDIAL PERFUSION IMAGING   EKG 12-Lead    Medication Changes: Meds ordered this encounter  Medications   pantoprazole (PROTONIX) 40 MG tablet    Sig: Take 1 tablet (40 mg total) by mouth daily.    Dispense:  30 tablet    Refill:  11     Current medicines are reviewed at length with  the patient today.  The patient does not have concerns regarding medicines.   History of Present Illness:    FOCUSED PROBLEM LIST:   1.  Atrial flutter on apixaban; CV 2 score of 2 2.  OSA on CPAP  The patient is a 77 y.o. male with the indicated medical history here for an expedited doctor of the day appointment with me due to chest pain.  The patient normally follows with Dr. Elease Hashimoto.  The patient saw Dr. Elease Hashimoto in March due to atrial flutter.  An echocardiogram demonstrated preserved LV function with no significant valvular abnormalities.  He was started on Eliquis and referred for a cardioversion in early April which was successful.  He was diagnosed with OSA and started CPAP which has helped his sleep quality.  The patient tells me that over the last few weeks he has developed shortness of breath and chest discomfort.  Interestingly the patient tells me it feels like GERD.  He feels an acid indigestion type feeling on with exertion and sometimes after eating.  He was seen by his PCP who referred the patient for cardiology evaluation.  He was started on Imdur 30 mg empirically.  This has not really helped his symptoms.  EKG at his PCPs office was not done.  EKG done today demonstrates recurrent atrial flutter with 4:1 conduction.  He does have back surgery scheduled for early June.  Current Medications: Current Meds  Medication Sig   acetaminophen (TYLENOL) 500 MG tablet Take 500-1,000 mg by mouth every 6 (six) hours as needed for moderate pain.   apixaban (ELIQUIS) 5 MG TABS tablet Take 1 tablet (5 mg total) by mouth 2 (two) times daily.   B Complex Vitamins (B COMPLEX PO) Take 1 tablet by mouth daily.   Calcium Carbonate (CALCIUM 500 PO) Take 1 tablet by mouth daily.   Carboxymethylcellul-Glycerin (LUBRICATING EYE DROPS OP) Place 1 drop into both eyes daily.   cholecalciferol (VITAMIN D3) 25 MCG (1000 UNIT) tablet Take 1,000 Units by mouth in the morning and at bedtime.    Coenzyme Q10 (COQ10) 200 MG CAPS Take 2 tablets by mouth daily.   COLLAGEN PO Take 1 Scoop by mouth daily.   CYANOCOBALAMIN PO Take 1 tablet by mouth daily.   hydrocortisone 2.5 % cream Apply 1 application topically daily.   isosorbide mononitrate (IMDUR) 30 MG 24 hr tablet Take 1 tablet (30 mg total) by mouth daily.   ketoconazole (NIZORAL) 2 % shampoo Apply 1 Application topically 3 (three) times a week.   MAGNESIUM PO Take 1 tablet by mouth at bedtime.   Melatonin 10 MG CAPS Take 10 mg by mouth at bedtime.   Multiple Vitamins-Minerals (CENTRUM SILVER 50+MEN PO) Take 1 tablet by mouth every morning.   Multiple Vitamins-Minerals (EMERGEN-C IMMUNE) PACK Take 1 packet by mouth daily as needed (immune support).   nitroGLYCERIN (NITROSTAT) 0.4 MG SL tablet Dissolve 1 tablet under the tongue every 5 minutes as needed for chest pain. Max of 3 doses, then 911.   Nutritional Supplements (ADULT NUTRITIONAL SUPPLEMENT PO) Alpha Lipoic Acid 250 mg before meals   Nutritional Supplements (ADULT NUTRITIONAL SUPPLEMENT PO) MCT Oil-1 tsp in coffee   Omega-3 Fatty Acids (SALMON OIL PO) Take 1,000 mg by mouth daily. Wild [place] salmon oil   pantoprazole (PROTONIX) 40 MG tablet Take 1 tablet (40 mg total) by mouth daily.   Sennosides 25 MG TABS Take 50 mg by mouth daily as needed (constipation).   vitamin E 180 MG (400 UNITS) capsule Take 400 Units by mouth daily.   VITAMIN K PO Take 90 mcg by mouth daily. (MK-7)   Zinc 50 MG TABS Take 1 tablet by mouth daily.     Allergies:    Codeine   Social History:   Social History   Tobacco Use   Smoking status: Never   Smokeless tobacco: Never  Substance Use Topics   Alcohol use: Yes    Alcohol/week: 2.0 standard drinks of alcohol    Types: 2 Standard drinks or equivalent per week   Drug use: No     Family Hx: Family History  Problem Relation Age of Onset   Colon cancer Neg Hx    Colon polyps Neg Hx    Esophageal cancer Neg Hx    Stomach cancer Neg  Hx    Rectal cancer Neg Hx      Review of Systems:   Please see the history of present illness.    All other systems reviewed and are negative.     EKGs/Labs/Other Test Reviewed:    EKG:  EKG performed April 2024 that I personally reviewed demonstrates this rhythm first-degree AV block; EKG performed today that I personally reviewed demonstrates atrial flutter with 4-1 conduction.  Prior CV studies:  Cardiac Studies & Procedures       ECHOCARDIOGRAM  ECHOCARDIOGRAM COMPLETE 05/14/2022  Narrative ECHOCARDIOGRAM REPORT    Patient Name:  Kyle [name] Date of Exam: 05/14/2022 Medical Rec #:  [id number]      Height:       67.0 in Accession #:    [id number]     Weight:       191.6 lb Date of Birth:  05-24-1945       BSA:          1.986 m Patient Age:    76 years       BP:           138/80 mmHg Patient Gender: M              HR:           86 bpm. Exam Location:  [address]  Procedure: 2D Echo, 3D Echo, Cardiac Doppler and Color Doppler  Indications:    I48.3 Atrial Flutter  History:        Patient has no prior history of Echocardiogram examinations. Abnormal ECG; Arrythmias:Atrial Flutter. Pre-Operative Eval for Back Surgery. Patient was prepped for surgery and given Anesthesia, then found to be in atrial flutter.  Sonographer:    Farrel Conners RDCS Referring Phys: [place]  IMPRESSIONS   1. Left ventricular ejection fraction, by estimation, is 60 to 65%. The left ventricle has normal function. The left ventricle has no regional wall motion abnormalities. There is mild concentric left ventricular hypertrophy. Left ventricular diastolic parameters are indeterminate. 2. Right ventricular systolic function is normal. The right ventricular size is normal. Tricuspid regurgitation signal is inadequate for assessing PA pressure. 3. Left atrial size was moderately dilated. 4. The mitral valve is degenerative. No evidence of mitral valve regurgitation. Moderate  mitral annular calcification. 5. Aortic valve regurgitation is trivial. Aortic valve sclerosis/calcification is present, without any evidence of aortic stenosis. 6. The inferior vena cava is normal in size with greater than 50% respiratory variability, suggesting right atrial pressure of 3 mmHg.  Comparison(s): No prior Echocardiogram.  FINDINGS Left Ventricle: Left ventricular ejection fraction, by estimation, is 60 to 65%. The left ventricle has normal function. The left ventricle has no regional wall motion abnormalities. The left ventricular internal cavity size was normal in size. There is mild concentric left ventricular hypertrophy. Left ventricular diastolic parameters are indeterminate.  Right Ventricle: The right ventricular size is normal. Right ventricular systolic function is normal. Tricuspid regurgitation signal is inadequate for assessing PA pressure.  Left Atrium: Left atrial size was moderately dilated.  Right Atrium: Right atrial size was normal in size.  Pericardium: There is no evidence of pericardial effusion.  Mitral Valve: The mitral valve is degenerative in appearance. Moderate mitral annular calcification. No evidence of mitral valve regurgitation.  Tricuspid Valve: Tricuspid valve regurgitation is not demonstrated.  Aortic Valve: Aortic valve regurgitation is trivial. Aortic valve sclerosis/calcification is present, without any evidence of aortic stenosis. Aortic valve mean gradient measures 8.3 mmHg. Aortic valve peak gradient measures 15.7 mmHg. Aortic valve area, by VTI measures 2.45 cm.  Pulmonic Valve: Pulmonic valve regurgitation is mild.  Aorta: The aortic root and ascending aorta are structurally normal, with no evidence of dilitation.  Venous: The inferior vena cava is normal in size with greater than 50% respiratory variability, suggesting right atrial pressure of 3 mmHg.  IAS/Shunts: No atrial level shunt detected by color flow Doppler.   LEFT  VENTRICLE PLAX 2D LVIDd:         4.20 cm   Diastology LVIDs:         2.40 cm   LV  e' medial:    7.29 cm/s LV PW:         1.10 cm   LV E/e' medial:  16.0 LV IVS:        1.20 cm   LV e' lateral:   9.46 cm/s LVOT diam:     2.50 cm   LV E/e' lateral: 12.4 LV SV:         97 LV SV Index:   49 LVOT Area:     4.91 cm  3D Volume EF: 3D EF:        62 % LV EDV:       152 ml LV ESV:       59 ml LV SV:        94 ml  RIGHT VENTRICLE RV Basal diam:  3.10 cm RV S prime:     12.70 cm/s TAPSE (M-mode): 1.8 cm  LEFT ATRIUM              Index        RIGHT ATRIUM           Index LA diam:        5.20 cm  2.62 cm/m   RA Pressure: 3.00 mmHg LA Vol (A2C):   114.0 ml 57.41 ml/m  RA Area:     16.10 cm LA Vol (A4C):   94.3 ml  47.49 ml/m  RA Volume:   39.90 ml  20.09 ml/m LA Biplane Vol: 104.0 ml 52.37 ml/m AORTIC VALVE AV Area (Vmax):    2.52 cm AV Area (Vmean):   2.53 cm AV Area (VTI):     2.45 cm AV Vmax:           198.00 cm/s AV Vmean:          133.333 cm/s AV VTI:            0.397 m AV Peak Grad:      15.7 mmHg AV Mean Grad:      8.3 mmHg LVOT Vmax:         101.55 cm/s LVOT Vmean:        68.800 cm/s LVOT VTI:          0.198 m LVOT/AV VTI ratio: 0.50  AORTA Ao Root diam: 3.60 cm Ao Asc diam:  3.80 cm  MITRAL VALVE                TRICUSPID VALVE MV Area (PHT): cm          Estimated RAP:  3.00 mmHg MV Decel Time: 202 msec MV E velocity: 117.00 cm/s  SHUNTS Systemic VTI:  0.20 m Systemic Diam: 2.50 cm  Carolan Clines Electronically signed by Carolan Clines Signature Date/Time: 05/14/2022/12:32:47 PM    Final             Other studies Reviewed: Review of the additional studies/records demonstrates: Imaging studies available did not show aortic atherosclerosis or coronary artery calcification  Recent Labs: 04/09/2022: ALT 20 05/06/2022: TSH 3.160 05/21/2022: BUN 14; Creatinine, Ser 0.98; Hemoglobin 14.5; Platelets 306; Potassium 4.5; Sodium 137   Recent Lipid Panel Lab  Results  Component Value Date/Time   CHOL 194 04/09/2022 07:33 AM   TRIG 72.0 04/09/2022 07:33 AM   HDL 66.70 04/09/2022 07:33 AM   LDLCALC 113 (H) 04/09/2022 07:33 AM    Risk Assessment/Calculations:     CHA2DS2-VASc Score = 2   This indicates a 2.2% annual risk of stroke. The patient's score is based upon: CHF History: 0  HTN History: 0 Diabetes History: 0 Stroke History: 0 Vascular Disease History: 0 Age Score: 2 Gender Score: 0    STOP-Bang Score:  6            Physical Exam:    VS:  BP 122/60   Pulse 79   Ht 5' 7.5" (1.715 m)   Wt 193 lb 9.6 oz (87.8 kg)   SpO2 97%   BMI 29.87 kg/m    Wt Readings from Last 3 Encounters:  07/08/22 193 lb 9.6 oz (87.8 kg)  07/03/22 192 lb 6 oz (87.3 kg)  05/30/22 190 lb (86.2 kg)    GENERAL:  No apparent distress, AOx3 HEENT:  No carotid bruits, +2 carotid impulses, no scleral icterus CAR: RRR no murmurs, gallops, rubs, or thrills RES:  Clear to auscultation bilaterally ABD:  Soft, nontender, nondistended, positive bowel sounds x 4 VASC:  +2 radial pulses, +2 carotid pulses, palpable pedal pulses NEURO:  CN 2-12 grossly intact; motor and sensory grossly intact PSYCH:  No active depression or anxiety EXT:  No edema, ecchymosis, or cyanosis  Signed, Orbie Pyo, MD  07/08/2022 12:05 PM    [facility] [address] [place], [place], [place]  [address] Phone: [phone]; Fax: [phone]   Note:  This document was prepared using Dragon voice recognition software and may include unintentional dictation errors.

## 2022-07-08 ENCOUNTER — Encounter: Payer: Self-pay | Admitting: *Deleted

## 2022-07-08 ENCOUNTER — Ambulatory Visit: Payer: No Typology Code available for payment source | Attending: Internal Medicine | Admitting: Internal Medicine

## 2022-07-08 ENCOUNTER — Encounter: Payer: Self-pay | Admitting: Internal Medicine

## 2022-07-08 VITALS — BP 122/60 | HR 79 | Ht 67.5 in | Wt 193.6 lb

## 2022-07-08 DIAGNOSIS — I483 Typical atrial flutter: Secondary | ICD-10-CM | POA: Diagnosis not present

## 2022-07-08 DIAGNOSIS — R072 Precordial pain: Secondary | ICD-10-CM

## 2022-07-08 MED ORDER — PANTOPRAZOLE SODIUM 40 MG PO TBEC: 40.0000 mg | DELAYED_RELEASE_TABLET | Freq: Every day | ORAL | 11 refills | Status: DC

## 2022-07-08 NOTE — Addendum Note (Signed)
Addended by: Alverda Skeans on: 07/08/2022 04:36 PM   Modules accepted: Orders

## 2022-07-08 NOTE — Patient Instructions (Addendum)
Medication Instructions:  Your physician has recommended you make the following change in your medication:   1.) start pantoprazole 40 mg - take one tablet daily  *If you need a refill on your cardiac medications before your next appointment, please call your pharmacy*   Lab Work: none   Testing/Procedures: Your physician has requested that you have a lexiscan myoview. For further information please visit [email or web address] Please follow instruction sheet, as given.   Follow-Up: As planned w Dr. Elease Hashimoto Other Instructions You have been referred to Dr. York Pellant  07/09/22 10:45 am please arrive 15 min early for check-in, registration

## 2022-07-08 NOTE — Addendum Note (Signed)
Addended by: Lendon Ka on: 07/08/2022 04:36 PM   Modules accepted: Orders

## 2022-07-09 ENCOUNTER — Encounter: Payer: Self-pay | Admitting: Cardiovascular Disease

## 2022-07-09 ENCOUNTER — Other Ambulatory Visit: Payer: Self-pay

## 2022-07-09 ENCOUNTER — Ambulatory Visit
Payer: No Typology Code available for payment source | Attending: Cardiovascular Disease | Admitting: Cardiovascular Disease

## 2022-07-09 VITALS — BP 124/78 | HR 72 | Ht 67.5 in | Wt 196.6 lb

## 2022-07-09 DIAGNOSIS — I483 Typical atrial flutter: Secondary | ICD-10-CM

## 2022-07-09 NOTE — Patient Instructions (Signed)
Medication Instructions:  Your physician recommends that you continue on your current medications as directed. Please refer to the Current Medication list given to you today. *If you need a refill on your cardiac medications before your next appointment, please call your pharmacy*   Testing/Procedures: Atrial Flutter Ablation Your physician has recommended that you have an ablation. Catheter ablation is a medical procedure used to treat some cardiac arrhythmias (irregular heartbeats). During catheter ablation, a long, thin, flexible tube is put into a blood vessel in your groin (upper thigh), or neck. This tube is called an ablation catheter. It is then guided to your heart through the blood vessel. Radio frequency waves destroy small areas of heart tissue where abnormal heartbeats may cause an arrhythmia to start. Please see the instruction sheet given to you today.  You are scheduled for  Atrial Flutter Ablation  on Monday, October 7 with Dr. Halford Chessman.Please arrive at the Main Entrance A at [facility]: [address] [place], [place] [address] at 5:30 AM    Follow-Up: At [facility], you and your health needs are our priority.  As part of our continuing mission to provide you with exceptional heart care, we have created designated Provider Care Teams.  These Care Teams include your primary Cardiologist (physician) and Advanced Practice Providers (APPs -  Physician Assistants and Nurse Practitioners) who all work together to provide you with the care you need, when you need it.  We recommend signing up for the patient portal called "MyChart".  Sign up information is provided on this After Visit Summary.  MyChart is used to connect with patients for Virtual Visits (Telemedicine).  Patients are able to view lab/test results, encounter notes, upcoming appointments, etc.  Non-urgent messages can be sent to your provider as well.   To learn more about what you can do with MyChart, go  to [email or web address].    Your next appointment:   We will be in contact with you to set this up  Provider:   York Pellant, MD
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
## 2022-07-09 NOTE — Addendum Note (Signed)
Addended by: Sherle Poe R on: 07/09/2022 11:29 AM   Modules accepted: Orders
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
## 2022-07-10 ENCOUNTER — Telehealth (HOSPITAL_COMMUNITY): Payer: Self-pay | Admitting: *Deleted

## 2022-07-10 NOTE — Telephone Encounter (Signed)
Spoke with patient and he was given detailed instructions about his STRESS TEST on 5/17 at 7:15.

## 2022-07-12 ENCOUNTER — Ambulatory Visit (HOSPITAL_COMMUNITY): Payer: No Typology Code available for payment source | Attending: Internal Medicine

## 2022-07-12 DIAGNOSIS — R072 Precordial pain: Secondary | ICD-10-CM

## 2022-07-12 LAB — MYOCARDIAL PERFUSION IMAGING
LV dias vol: 117 mL (ref 62–150)
LV sys vol: 42 mL
Nuc Stress EF: 64 %
Peak HR: 82 {beats}/min
Rest HR: 76 {beats}/min
Rest Nuclear Isotope Dose: 10.4 mCi
SDS: 1
SRS: 0
SSS: 1
ST Depression (mm): 0 mm
Stress Nuclear Isotope Dose: 30.6 mCi
TID: 1.06

## 2022-07-12 MED ORDER — REGADENOSON 0.4 MG/5ML IV SOLN
0.4000 mg | Freq: Once | INTRAVENOUS | Status: AC
  Administered 2022-07-12: 0.4 mg via INTRAVENOUS

## 2022-07-12 MED ORDER — TECHNETIUM TC 99M TETROFOSMIN IV KIT
10.4000 | PACK | Freq: Once | INTRAVENOUS | Status: AC | PRN
  Administered 2022-07-12: 10.4 via INTRAVENOUS

## 2022-07-12 MED ORDER — TECHNETIUM TC 99M TETROFOSMIN IV KIT
30.6000 | PACK | Freq: Once | INTRAVENOUS | Status: AC | PRN
  Administered 2022-07-12: 30.6 via INTRAVENOUS

## 2022-07-15 ENCOUNTER — Telehealth: Payer: Self-pay | Admitting: Internal Medicine

## 2022-07-15 NOTE — Telephone Encounter (Signed)
Patient stated he lost his debit and insurance card last Friday when he came for a nuclear test and was checking to see if these documents were found in the area.  Patient wants a call back if found or not found.

## 2022-07-15 NOTE — Telephone Encounter (Signed)
Called patient back about his message. Informed patient that we check the the Nuc Med department and the lost and found, and we do not have his cards. Patient thanked me for the call and he will get his card replaced.

## 2022-07-16 ENCOUNTER — Telehealth: Payer: Self-pay | Admitting: Cardiovascular Disease

## 2022-07-16 NOTE — Telephone Encounter (Signed)
Patient called stating he is scheduled for back surgery next month.  He wants to know if he should wait to have back surgery until after he has his ablation done. Please advise.

## 2022-07-16 NOTE — Telephone Encounter (Signed)
Pt is scheduled for 08/05/22.Marland KitchenMarland Kitchen

## 2022-07-23 ENCOUNTER — Ambulatory Visit: Payer: No Typology Code available for payment source | Attending: Cardiovascular Disease

## 2022-07-23 DIAGNOSIS — I483 Typical atrial flutter: Secondary | ICD-10-CM

## 2022-07-24 LAB — BASIC METABOLIC PANEL
BUN/Creatinine Ratio: 19 (ref 10–24)
BUN: 17 mg/dL (ref 8–27)
CO2: 22 mmol/L (ref 20–29)
Calcium: 9.2 mg/dL (ref 8.6–10.2)
Chloride: 100 mmol/L (ref 96–106)
Creatinine, Ser: 0.91 mg/dL (ref 0.76–1.27)
Glucose: 95 mg/dL (ref 70–99)
Potassium: 4.4 mmol/L (ref 3.5–5.2)
Sodium: 135 mmol/L (ref 134–144)
eGFR: 88 mL/min/{1.73_m2} (ref >59)

## 2022-07-24 LAB — CBC
Hematocrit: 40.9 % (ref 37.5–51.0)
Hemoglobin: 13.7 g/dL (ref 13.0–17.7)
MCH: 31.1 pg (ref 26.6–33.0)
MCHC: 33.5 g/dL (ref 31.5–35.7)
MCV: 93 fL (ref 79–97)
Platelets: 270 10*3/uL (ref 150–450)
RBC: 4.41 x10E6/uL (ref 4.14–5.80)
RDW: 12.3 % (ref 11.6–15.4)
WBC: 9.4 10*3/uL (ref 3.4–10.8)

## 2022-07-27 DIAGNOSIS — G4733 Obstructive sleep apnea (adult) (pediatric): Secondary | ICD-10-CM | POA: Diagnosis not present

## 2022-07-27 DIAGNOSIS — I483 Typical atrial flutter: Secondary | ICD-10-CM | POA: Diagnosis not present

## 2022-07-31 ENCOUNTER — Inpatient Hospital Stay (HOSPITAL_COMMUNITY): Admit: 2022-07-31 | Payer: No Typology Code available for payment source | Admitting: Orthopedic Surgery

## 2022-07-31 SURGERY — ANTERIOR LATERAL LUMBAR FUSION 3 LEVELS
Anesthesia: General | Laterality: Left

## 2022-08-01 ENCOUNTER — Inpatient Hospital Stay (HOSPITAL_COMMUNITY): Admit: 2022-08-01 | Payer: No Typology Code available for payment source | Admitting: Orthopedic Surgery

## 2022-08-01 SURGERY — POSTERIOR LUMBAR FUSION 4 LEVEL
Anesthesia: General

## 2022-08-02 NOTE — Progress Notes (Signed)
Mr. Karnes spoke called having concerns with what type of anesthesia he would be on Monday for his procedure explaining that he had an issue with anesthesia for a recent surgery. I spoke with Otilio Saber regarding the patient's concern, he said he would forward it on to Dr.Mealor. He also wanted me to relay to the patient that the Anesthesia team would talk to him Monday before the procedure and answer any questions he may have. I passed this information onto the patient about talking to the anesthesia team before his procedure about any concerns he may have.

## 2022-08-02 NOTE — Pre-Procedure Instructions (Signed)
Instructed patient on the following items: Arrival time 515 Nothing to eat or drink after midnight No meds AM of procedure Responsible person to drive you home and stay with you for 24 hrs  Have you missed any doses of anti-coagulant Eliquis- takes twice a day, hasn't missed any disease

## 2022-08-05 ENCOUNTER — Other Ambulatory Visit: Payer: Self-pay

## 2022-08-05 ENCOUNTER — Ambulatory Visit (HOSPITAL_COMMUNITY): Payer: No Typology Code available for payment source | Admitting: Anesthesiology

## 2022-08-05 ENCOUNTER — Encounter (HOSPITAL_COMMUNITY)
Admission: RE | Disposition: A | Discharge status: Home / Self Care | Payer: Self-pay | Source: Home / Self Care | Attending: Cardiovascular Disease

## 2022-08-05 ENCOUNTER — Encounter (HOSPITAL_COMMUNITY): Payer: Self-pay | Admitting: Cardiovascular Disease

## 2022-08-05 ENCOUNTER — Ambulatory Visit (HOSPITAL_COMMUNITY)
Admission: RE | Admit: 2022-08-05 | Discharge: 2022-08-05 | Disposition: A | Discharge status: Home / Self Care | Payer: No Typology Code available for payment source | Attending: Cardiovascular Disease | Admitting: Cardiovascular Disease

## 2022-08-05 ENCOUNTER — Ambulatory Visit (HOSPITAL_BASED_OUTPATIENT_CLINIC_OR_DEPARTMENT_OTHER): Payer: No Typology Code available for payment source | Admitting: Anesthesiology

## 2022-08-05 DIAGNOSIS — R0602 Shortness of breath: Secondary | ICD-10-CM | POA: Insufficient documentation

## 2022-08-05 DIAGNOSIS — I517 Cardiomegaly: Secondary | ICD-10-CM

## 2022-08-05 DIAGNOSIS — I4891 Unspecified atrial fibrillation: Secondary | ICD-10-CM

## 2022-08-05 DIAGNOSIS — M549 Dorsalgia, unspecified: Secondary | ICD-10-CM | POA: Diagnosis not present

## 2022-08-05 DIAGNOSIS — I4719 Other supraventricular tachycardia: Secondary | ICD-10-CM | POA: Insufficient documentation

## 2022-08-05 DIAGNOSIS — D6869 Other thrombophilia: Secondary | ICD-10-CM | POA: Insufficient documentation

## 2022-08-05 DIAGNOSIS — I4892 Unspecified atrial flutter: Secondary | ICD-10-CM | POA: Diagnosis not present

## 2022-08-05 DIAGNOSIS — G4730 Sleep apnea, unspecified: Secondary | ICD-10-CM | POA: Diagnosis not present

## 2022-08-05 DIAGNOSIS — Z7901 Long term (current) use of anticoagulants: Secondary | ICD-10-CM | POA: Insufficient documentation

## 2022-08-05 DIAGNOSIS — M1990 Unspecified osteoarthritis, unspecified site: Secondary | ICD-10-CM | POA: Diagnosis not present

## 2022-08-05 HISTORY — PX: A-FLUTTER ABLATION: EP1230

## 2022-08-05 SURGERY — A-FLUTTER ABLATION
Anesthesia: General

## 2022-08-05 MED ORDER — ACETAMINOPHEN 325 MG PO TABS: 650.0000 mg | ORAL_TABLET | ORAL | Status: DC | PRN

## 2022-08-05 MED ORDER — ONDANSETRON HCL 4 MG/2ML IJ SOLN: 4.0000 mg | Freq: Four times a day (QID) | INTRAMUSCULAR | Status: DC | PRN

## 2022-08-05 MED ORDER — SUGAMMADEX SODIUM 200 MG/2ML IV SOLN
INTRAVENOUS | Status: DC | PRN
  Administered 2022-08-05: 200 mg via INTRAVENOUS

## 2022-08-05 MED ORDER — PROPOFOL 10 MG/ML IV BOLUS
INTRAVENOUS | Status: DC | PRN
  Administered 2022-08-05: 130 mg via INTRAVENOUS

## 2022-08-05 MED ORDER — HEPARIN SODIUM (PORCINE) 1000 UNIT/ML IJ SOLN
INTRAMUSCULAR | Status: DC | PRN
  Administered 2022-08-05 (×2): 3000 [IU] via INTRAVENOUS
  Administered 2022-08-05: 16000 [IU] via INTRAVENOUS

## 2022-08-05 MED ORDER — PHENYLEPHRINE HCL-NACL 20-0.9 MG/250ML-% IV SOLN
INTRAVENOUS | Status: DC | PRN
  Administered 2022-08-05: 25 ug/min via INTRAVENOUS

## 2022-08-05 MED ORDER — ONDANSETRON HCL 4 MG/2ML IJ SOLN
INTRAMUSCULAR | Status: DC | PRN
  Administered 2022-08-05: 4 mg via INTRAVENOUS

## 2022-08-05 MED ORDER — SODIUM CHLORIDE 0.9 % IV SOLN: 250.0000 mL | INTRAVENOUS | Status: DC | PRN

## 2022-08-05 MED ORDER — MIDAZOLAM HCL 5 MG/5ML IJ SOLN
INTRAMUSCULAR | Status: DC | PRN
  Administered 2022-08-05: 1 mg via INTRAVENOUS

## 2022-08-05 MED ORDER — SODIUM CHLORIDE 0.9 % IV SOLN: INTRAVENOUS | Status: DC

## 2022-08-05 MED ORDER — LIDOCAINE 2% (20 MG/ML) 5 ML SYRINGE
INTRAMUSCULAR | Status: DC | PRN
  Administered 2022-08-05: 100 mg via INTRAVENOUS

## 2022-08-05 MED ORDER — PROTAMINE SULFATE 10 MG/ML IV SOLN
INTRAVENOUS | Status: DC | PRN
  Administered 2022-08-05: 50 mg via INTRAVENOUS

## 2022-08-05 MED ORDER — FENTANYL CITRATE (PF) 100 MCG/2ML IJ SOLN
INTRAMUSCULAR | Status: DC | PRN
  Administered 2022-08-05 (×2): 25 ug via INTRAVENOUS

## 2022-08-05 MED ORDER — ROCURONIUM BROMIDE 100 MG/10ML IV SOLN
INTRAVENOUS | Status: DC | PRN
  Administered 2022-08-05: 90 mg via INTRAVENOUS
  Administered 2022-08-05: 20 mg via INTRAVENOUS

## 2022-08-05 MED ORDER — HEPARIN SODIUM (PORCINE) 1000 UNIT/ML IJ SOLN
INTRAMUSCULAR | Status: AC
  Filled 2022-08-05: qty 10

## 2022-08-05 MED ORDER — DEXAMETHASONE SODIUM PHOSPHATE 10 MG/ML IJ SOLN
INTRAMUSCULAR | Status: DC | PRN
  Administered 2022-08-05: 5 mg via INTRAVENOUS

## 2022-08-05 MED ORDER — HEPARIN (PORCINE) IN NACL 1000-0.9 UT/500ML-% IV SOLN
INTRAVENOUS | Status: DC | PRN
  Administered 2022-08-05 (×3): 500 mL

## 2022-08-05 MED ORDER — SODIUM CHLORIDE 0.9% FLUSH: 3.0000 mL | Freq: Two times a day (BID) | INTRAVENOUS | Status: DC

## 2022-08-05 MED ORDER — SODIUM CHLORIDE 0.9% FLUSH: 3.0000 mL | INTRAVENOUS | Status: DC | PRN

## 2022-08-05 MED ORDER — PHENYLEPHRINE 80 MCG/ML (10ML) SYRINGE FOR IV PUSH (FOR BLOOD PRESSURE SUPPORT)
PREFILLED_SYRINGE | INTRAVENOUS | Status: DC | PRN
  Administered 2022-08-05 (×4): 80 ug via INTRAVENOUS

## 2022-08-05 MED ORDER — HEPARIN SODIUM (PORCINE) 1000 UNIT/ML IJ SOLN
INTRAMUSCULAR | Status: DC | PRN
  Administered 2022-08-05: 1000 [IU] via INTRAVENOUS

## 2022-08-05 SURGICAL SUPPLY — 16 items
CATH 8FR REPROCESSED SOUNDSTAR (CATHETERS) ×1 IMPLANT
CATH 8FR SOUNDSTAR REPROCESSED (CATHETERS) IMPLANT
CATH PIGTAIL STEERABLE D1 8.7 (WIRE) IMPLANT
CATH S-M CIRCA TEMP PROBE (CATHETERS) IMPLANT
CATH SMTCH THERMOCOOL SF FJ (CATHETERS) IMPLANT
CATH WEB BI DIR CSDF CRV REPRO (CATHETERS) IMPLANT
CLOSURE PERCLOSE PROSTYLE (VASCULAR PRODUCTS) IMPLANT
DEVICE CLOSURE MYNXGRIP 6/7F (Vascular Products) IMPLANT
PACK EP LATEX FREE (CUSTOM PROCEDURE TRAY) ×1
PACK EP LF (CUSTOM PROCEDURE TRAY) ×1 IMPLANT
PAD DEFIB RADIO PHYSIO CONN (PAD) ×1 IMPLANT
PATCH CARTO3 (PAD) IMPLANT
SHEATH PINNACLE 8F 10CM (SHEATH) IMPLANT
SHEATH PINNACLE 9F 10CM (SHEATH) IMPLANT
SHEATH PROBE COVER 6X72 (BAG) IMPLANT
TUBING SMART ABLATE COOLFLOW (TUBING) IMPLANT

## 2022-08-05 NOTE — Discharge Instructions (Signed)

## 2022-08-05 NOTE — Interval H&P Note (Signed)
History and Physical Interval Note:  08/05/2022 8:04 AM  Kyle [name]  has presented today for surgery, with the diagnosis of aflutter.  The various methods of treatment have been discussed with the patient and family. After consideration of risks, benefits and other options for treatment, the patient has consented to  Procedure(s): A-FLUTTER ABLATION (N/A) as a surgical intervention.  The patient's history has been reviewed, patient examined, no change in status, stable for surgery.  I have reviewed the patient's chart and labs.  Questions were answered to the patient's satisfaction.     Roberts Gaudy Lavoy Bernards

## 2022-08-05 NOTE — Progress Notes (Signed)
Per Dr. Nelly Laurence, patient can ambulate now and discharge as long as patient is stable.

## 2022-08-05 NOTE — Anesthesia Postprocedure Evaluation (Signed)
Anesthesia Post Note  Patient: Kyle [name]  Procedure(s) Performed: A-FLUTTER ABLATION     Patient location during evaluation: PACU Anesthesia Type: General Level of consciousness: awake and alert Pain management: pain level controlled Vital Signs Assessment: post-procedure vital signs reviewed and stable Respiratory status: spontaneous breathing, nonlabored ventilation and respiratory function stable Cardiovascular status: blood pressure returned to baseline and stable Postop Assessment: no apparent nausea or vomiting Anesthetic complications: no   No notable events documented.  Last Vitals:  Vitals:   08/05/22 1230 08/05/22 1300  BP: 114/75 (!) 150/80  Pulse: 77 82  Resp: 18 18  Temp:    SpO2: 95% 97%    Last Pain:  Vitals:   08/05/22 1145  TempSrc:   PainSc: 0-No pain                 Lowella Curb

## 2022-08-05 NOTE — Anesthesia Procedure Notes (Signed)
Procedure Name: Intubation Date/Time: 08/05/2022 8:31 AM  Performed by: Marny Lowenstein, CRNAPre-anesthesia Checklist: Patient identified, Emergency Drugs available, Suction available and Patient being monitored Patient Re-evaluated:Patient Re-evaluated prior to induction Oxygen Delivery Method: Circle system utilized Preoxygenation: Pre-oxygenation with 100% oxygen Induction Type: IV induction Ventilation: Mask ventilation without difficulty Laryngoscope Size: Miller and 2 Grade View: Grade II Tube type: Oral Tube size: 7.5 mm Number of attempts: 1 Airway Equipment and Method: Patient positioned with wedge pillow and Stylet Placement Confirmation: ETT inserted through vocal cords under direct vision, positive ETCO2 and breath sounds checked- equal and bilateral Secured at: 23 cm Tube secured with: Tape Dental Injury: Teeth and Oropharynx as per pre-operative assessment

## 2022-08-05 NOTE — Transfer of Care (Signed)
Immediate Anesthesia Transfer of Care Note  Patient: Kyle [name]  Procedure(s) Performed: A-FLUTTER ABLATION  Patient Location: PACU and Cath Lab  Anesthesia Type:General  Level of Consciousness: drowsy and patient cooperative  Airway & Oxygen Therapy: Patient Spontanous Breathing and Patient connected to nasal cannula oxygen  Post-op Assessment: Report given to RN and Post -op Vital signs reviewed and stable  Post vital signs: Reviewed and stable  Last Vitals:  Vitals Value Taken Time  BP    Temp    Pulse    Resp    SpO2      Last Pain:  Vitals:   08/05/22 0555  PainSc: 0-No pain         Complications: No notable events documented.

## 2022-08-05 NOTE — Anesthesia Preprocedure Evaluation (Signed)
Anesthesia Evaluation  Patient identified by MRN, date of birth, ID band Patient awake    Reviewed: Allergy & Precautions, H&P , NPO status , Patient's Chart, lab work & pertinent test results  Airway Mallampati: II  TM Distance: >3 FB Neck ROM: Full    Dental no notable dental hx.    Pulmonary neg pulmonary ROS   Pulmonary exam normal breath sounds clear to auscultation       Cardiovascular Normal cardiovascular exam+ dysrhythmias Atrial Fibrillation  Rhythm:Irregular Rate:Normal  . Left ventricular ejection fraction, by estimation, is 60 to 65%. The  left ventricle has normal function. The left ventricle has no regional  wall motion abnormalities. There is mild concentric left ventricular  hypertrophy. Left ventricular diastolic  parameters are indeterminate.   2. Right ventricular systolic function is normal. The right ventricular  size is normal. Tricuspid regurgitation signal is inadequate for assessing  PA pressure.   3. Left atrial size was moderately dilated.   4. The mitral valve is degenerative. No evidence of mitral valve  regurgitation. Moderate mitral annular calcification.   5. Aortic valve regurgitation is trivial. Aortic valve  sclerosis/calcification is present, without any evidence of aortic  stenosis.   6. The inferior vena cava is normal in size with greater than 50%  respiratory variability, suggesting right atrial pressure of 3 mmHg.      Neuro/Psych negative neurological ROS  negative psych ROS   GI/Hepatic negative GI ROS, Neg liver ROS,,,  Endo/Other  negative endocrine ROS    Renal/GU negative Renal ROS  negative genitourinary   Musculoskeletal  (+) Arthritis , Osteoarthritis,    Abdominal   Peds negative pediatric ROS (+)  Hematology negative hematology ROS (+)   Anesthesia Other Findings   Reproductive/Obstetrics negative OB ROS                              Anesthesia Physical Anesthesia Plan  ASA: 3  Anesthesia Plan: General   Post-op Pain Management: Minimal or no pain anticipated   Induction: Intravenous  PONV Risk Score and Plan: 2 and Ondansetron, Treatment may vary due to age or medical condition and Midazolam  Airway Management Planned: Oral ETT  Additional Equipment:   Intra-op Plan:   Post-operative Plan: Extubation in OR  Informed Consent: I have reviewed the patients History and Physical, chart, labs and discussed the procedure including the risks, benefits and alternatives for the proposed anesthesia with the patient or authorized representative who has indicated his/her understanding and acceptance.     Dental advisory given  Plan Discussed with: CRNA and Surgeon  Anesthesia Plan Comments:        Anesthesia Quick Evaluation

## 2022-08-06 ENCOUNTER — Encounter (HOSPITAL_COMMUNITY): Payer: Self-pay | Admitting: Cardiovascular Disease

## 2022-08-06 DIAGNOSIS — D225 Melanocytic nevi of trunk: Secondary | ICD-10-CM | POA: Diagnosis not present

## 2022-08-06 DIAGNOSIS — L821 Other seborrheic keratosis: Secondary | ICD-10-CM | POA: Diagnosis not present

## 2022-08-06 DIAGNOSIS — D485 Neoplasm of uncertain behavior of skin: Secondary | ICD-10-CM | POA: Diagnosis not present

## 2022-08-06 DIAGNOSIS — C44519 Basal cell carcinoma of skin of other part of trunk: Secondary | ICD-10-CM | POA: Diagnosis not present

## 2022-08-06 DIAGNOSIS — Z85828 Personal history of other malignant neoplasm of skin: Secondary | ICD-10-CM | POA: Diagnosis not present

## 2022-08-06 DIAGNOSIS — L812 Freckles: Secondary | ICD-10-CM | POA: Diagnosis not present

## 2022-08-06 DIAGNOSIS — D3613 Benign neoplasm of peripheral nerves and autonomic nervous system of lower limb, including hip: Secondary | ICD-10-CM | POA: Diagnosis not present

## 2022-08-06 LAB — POCT ACTIVATED CLOTTING TIME
Activated Clotting Time: 244 seconds
Activated Clotting Time: 262 seconds

## 2022-08-14 ENCOUNTER — Encounter: Payer: Self-pay | Admitting: Cardiovascular Disease

## 2022-08-14 ENCOUNTER — Ambulatory Visit
Payer: No Typology Code available for payment source | Attending: Cardiovascular Disease | Admitting: Cardiovascular Disease

## 2022-08-14 VITALS — BP 132/72 | HR 74 | Ht 68.0 in | Wt 193.0 lb

## 2022-08-14 DIAGNOSIS — I483 Typical atrial flutter: Secondary | ICD-10-CM | POA: Diagnosis not present
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
## 2022-08-14 NOTE — Patient Instructions (Signed)
Medication Instructions:  STOP Isosorbide/Imdur STOP Pantoprazole/protonix *If you need a refill on your cardiac medications before your next appointment, please call your pharmacy*   Lab Work: NONE If you have labs (blood work) drawn today and your tests are completely normal, you will receive your results only by: MyChart Message (if you have MyChart) OR A paper copy in the mail If you have any lab test that is abnormal or we need to change your treatment, we will call you to review the results.   Testing/Procedures: NONE   Follow-Up: At [facility], you and your health needs are our priority.  As part of our continuing mission to provide you with exceptional heart care, we have created designated Provider Care Teams.  These Care Teams include your primary Cardiologist (physician) and Advanced Practice Providers (APPs -  Physician Assistants and Nurse Practitioners) who all work together to provide you with the care you need, when you need it.  We recommend signing up for the patient portal called "MyChart".  Sign up information is provided on this After Visit Summary.  MyChart is used to connect with patients for Virtual Visits (Telemedicine).  Patients are able to view lab/test results, encounter notes, upcoming appointments, etc.  Non-urgent messages can be sent to your provider as well.   To learn more about what you can do with MyChart, go to [email or web address].    Your next appointment:   As Needed  Provider:   Kristeen Miss, MD

## 2022-08-26 DIAGNOSIS — I483 Typical atrial flutter: Secondary | ICD-10-CM | POA: Diagnosis not present

## 2022-08-26 DIAGNOSIS — G4733 Obstructive sleep apnea (adult) (pediatric): Secondary | ICD-10-CM | POA: Diagnosis not present

## 2022-09-02 ENCOUNTER — Ambulatory Visit (HOSPITAL_COMMUNITY)
Admission: RE | Admit: 2022-09-02 | Discharge: 2022-09-02 | Disposition: A | Discharge status: Home / Self Care | Payer: No Typology Code available for payment source | Source: Ambulatory Visit | Attending: Physician Assistant | Admitting: Physician Assistant

## 2022-09-02 VITALS — BP 152/84 | HR 74 | Ht 68.0 in | Wt 199.6 lb

## 2022-09-02 DIAGNOSIS — Z7901 Long term (current) use of anticoagulants: Secondary | ICD-10-CM | POA: Insufficient documentation

## 2022-09-02 DIAGNOSIS — D6869 Other thrombophilia: Secondary | ICD-10-CM | POA: Diagnosis not present

## 2022-09-02 DIAGNOSIS — I484 Atypical atrial flutter: Secondary | ICD-10-CM | POA: Insufficient documentation

## 2022-09-02 LAB — BASIC METABOLIC PANEL
Anion gap: 9 (ref 5–15)
BUN: 15 mg/dL (ref 8–23)
CO2: 23 mmol/L (ref 22–32)
Calcium: 8.9 mg/dL (ref 8.9–10.3)
Chloride: 104 mmol/L (ref 98–111)
Creatinine, Ser: 0.93 mg/dL (ref 0.61–1.24)
GFR, Estimated: >60 mL/min (ref >60)
Glucose, Bld: 105 mg/dL — ABNORMAL HIGH (ref 70–99)
Potassium: 4.2 mmol/L (ref 3.5–5.1)
Sodium: 136 mmol/L (ref 135–145)

## 2022-09-02 LAB — CBC
HCT: 39.7 % (ref 39.0–52.0)
Hemoglobin: 12.9 g/dL — ABNORMAL LOW (ref 13.0–17.0)
MCH: 30.8 pg (ref 26.0–34.0)
MCHC: 32.5 g/dL (ref 30.0–36.0)
MCV: 94.7 fL (ref 80.0–100.0)
Platelets: 257 10*3/uL (ref 150–400)
RBC: 4.19 MIL/uL — ABNORMAL LOW (ref 4.22–5.81)
RDW: 12.7 % (ref 11.5–15.5)
WBC: 9.9 10*3/uL (ref 4.0–10.5)
nRBC: 0 % (ref 0.0–0.2)

## 2022-09-02 NOTE — Patient Instructions (Addendum)
Cardioversion scheduled for: Friday, July 12th   - Arrive at the [facility] and go to admitting at 930am   - Do not eat or drink anything after midnight the night prior to your procedure.   - Take all your morning medication (except diabetic medications) with a sip of water prior to arrival.  - You will not be able to drive home after your procedure.    - Do NOT miss any doses of your blood thinner - if you should miss a dose please notify our office immediately.   - If you feel as if you go back into normal rhythm prior to scheduled cardioversion, please notify our office immediately.   If your procedure is canceled in the cardioversion suite you will be charged a cancellation fee.

## 2022-09-02 NOTE — Progress Notes (Signed)
Primary Care Physician: Hannah Beat, MD Primary Cardiologist: Kristeen Miss, MD Electrophysiologist: Maurice Small, MD  Referring Physician: Dr Gertie Baron I Flam is a 77 y.o. male with a history of atrial flutter and OSA who presents for follow up in the [facility]. Patient originally saw Dr. Elease Hashimoto in 04/2022 due to atrial flutter. He was referred for cardioversion, which was successful. Echocardiogram showed normal LV function. He was diagnosed with sleep apnea and started CPAP. Patient found to be in recurrent atrial flutter on follow up and was referred to Dr Nelly Laurence for ablation. Patient is s/p RSPV isolation on 08/05/22. Patient is on Eliquis for a CHADS2VASC score of 2.  On follow up today, patient is back in atrial flutter. He is fairly asymptomatic and rate controlled.   Today, he denies symptoms of palpitations, chest pain, shortness of breath, orthopnea, PND, lower extremity edema, dizziness, presyncope, syncope, snoring, daytime somnolence, bleeding, or neurologic sequela. The patient is tolerating medications without difficulties and is otherwise without complaint today.    Atrial Fibrillation Risk Factors:  he does have symptoms or diagnosis of sleep apnea. he is compliant with CPAP therapy. he does not have a history of rheumatic fever. he does have a history of alcohol use.   Atrial Fibrillation Management history:  Previous antiarrhythmic drugs: none Previous cardioversions: 05/30/22 Previous ablations: 08/05/22 Anticoagulation history: Eliquis  ROS- All systems are reviewed and negative except as per the HPI above.   Physical Exam: BP (!) 152/84   Pulse 74   Ht 5\' 8"  (1.727 m)   Wt 90.5 kg   BMI 30.35 kg/m   GEN: Well nourished, well developed in no acute distress NECK: No JVD; No carotid bruits CARDIAC: Regular rate and rhythm, no murmurs, rubs, gallops RESPIRATORY:  Clear to auscultation without rales, wheezing  or rhonchi  ABDOMEN: Soft, non-tender, non-distended EXTREMITIES:  No edema; No deformity   Wt Readings from Last 3 Encounters:  09/02/22 90.5 kg  08/14/22 87.5 kg  08/05/22 86.2 kg     EKG today demonstrates  Atypical atrial flutter with 4:1 block, NO STEMI Vent. rate 74 BPM PR interval * ms QRS duration 76 ms QT/QTcB 382/424 ms  Echo 05/14/22 demonstrated  1. Left ventricular ejection fraction, by estimation, is 60 to 65%. The  left ventricle has normal function. The left ventricle has no regional  wall motion abnormalities. There is mild concentric left ventricular  hypertrophy. Left ventricular diastolic parameters are indeterminate.   2. Right ventricular systolic function is normal. The right ventricular  size is normal. Tricuspid regurgitation signal is inadequate for assessing  PA pressure.   3. Left atrial size was moderately dilated.   4. The mitral valve is degenerative. No evidence of mitral valve  regurgitation. Moderate mitral annular calcification.   5. Aortic valve regurgitation is trivial. Aortic valve  sclerosis/calcification is present, without any evidence of aortic  stenosis.   6. The inferior vena cava is normal in size with greater than 50%  respiratory variability, suggesting right atrial pressure of 3 mmHg.   Comparison(s): No prior Echocardiogram.    CHA2DS2-VASc Score = 2  The patient's score is based upon: CHF History: 0 HTN History: 0 Diabetes History: 0 Stroke History: 0 Vascular Disease History: 0 Age Score: 2 Gender Score: 0       ASSESSMENT AND PLAN: Atypical atrial flutter The patient's CHA2DS2-VASc score is 2, indicating a 2.2% annual risk of stroke.   S/p PVI  of RSPV 08/05/22 Patient in atypical atrial flutter today, rate controlled.  We discussed rhythm control options. Will plan for repeat DCCV.  Check bmet/cbc Continue Eliquis 5 mg BID  Secondary Hypercoagulable State (ICD10:  D68.69) The patient is at significant risk  for stroke/thromboembolism based upon his CHA2DS2-VASc Score of 2.  Continue Apixaban (Eliquis).     Follow up in the AF clinic post DCCV.    Informed Consent   Shared Decision Making/Informed Consent The risks (stroke, cardiac arrhythmias rarely resulting in the need for a temporary or permanent pacemaker, skin irritation or burns and complications associated with conscious sedation including aspiration, arrhythmia, respiratory failure and death), benefits (restoration of normal sinus rhythm) and alternatives of a direct current cardioversion were explained in detail to Mr. Maire and he agrees to proceed.         Jorja Loa PA-C [facility] [address] [place], [place] [address] [phone]

## 2022-09-02 NOTE — H&P (View-Only) (Signed)
  Primary Care Physician: Copland, Spencer, MD Primary Cardiologist: Philip Nahser, MD Electrophysiologist: Augustus E Mealor, MD  Referring Physician: Dr Mealor   Hershel I Teska is a 77 y.o. male with a history of atrial flutter and OSA who presents for follow up in the [facility]. Patient originally saw Dr. Nahser in 04/2022 due to atrial flutter. He was referred for cardioversion, which was successful. Echocardiogram showed normal LV function. He was diagnosed with sleep apnea and started CPAP. Patient found to be in recurrent atrial flutter on follow up and was referred to Dr Mealor for ablation. Patient is s/p RSPV isolation on 08/05/22. Patient is on Eliquis for a CHADS2VASC score of 2.  On follow up today, patient is back in atrial flutter. He is fairly asymptomatic and rate controlled.   Today, he denies symptoms of palpitations, chest pain, shortness of breath, orthopnea, PND, lower extremity edema, dizziness, presyncope, syncope, snoring, daytime somnolence, bleeding, or neurologic sequela. The patient is tolerating medications without difficulties and is otherwise without complaint today.    Atrial Fibrillation Risk Factors:  he does have symptoms or diagnosis of sleep apnea. he is compliant with CPAP therapy. he does not have a history of rheumatic fever. he does have a history of alcohol use.   Atrial Fibrillation Management history:  Previous antiarrhythmic drugs: none Previous cardioversions: 05/30/22 Previous ablations: 08/05/22 Anticoagulation history: Eliquis  ROS- All systems are reviewed and negative except as per the HPI above.   Physical Exam: BP (!) 152/84   Pulse 74   Ht 5' 8" (1.727 m)   Wt 90.5 kg   BMI 30.35 kg/m   GEN: Well nourished, well developed in no acute distress NECK: No JVD; No carotid bruits CARDIAC: Regular rate and rhythm, no murmurs, rubs, gallops RESPIRATORY:  Clear to auscultation without rales, wheezing  or rhonchi  ABDOMEN: Soft, non-tender, non-distended EXTREMITIES:  No edema; No deformity   Wt Readings from Last 3 Encounters:  09/02/22 90.5 kg  08/14/22 87.5 kg  08/05/22 86.2 kg     EKG today demonstrates  Atypical atrial flutter with 4:1 block, NO STEMI Vent. rate 74 BPM PR interval * ms QRS duration 76 ms QT/QTcB 382/424 ms  Echo 05/14/22 demonstrated  1. Left ventricular ejection fraction, by estimation, is 60 to 65%. The  left ventricle has normal function. The left ventricle has no regional  wall motion abnormalities. There is mild concentric left ventricular  hypertrophy. Left ventricular diastolic parameters are indeterminate.   2. Right ventricular systolic function is normal. The right ventricular  size is normal. Tricuspid regurgitation signal is inadequate for assessing  PA pressure.   3. Left atrial size was moderately dilated.   4. The mitral valve is degenerative. No evidence of mitral valve  regurgitation. Moderate mitral annular calcification.   5. Aortic valve regurgitation is trivial. Aortic valve  sclerosis/calcification is present, without any evidence of aortic  stenosis.   6. The inferior vena cava is normal in size with greater than 50%  respiratory variability, suggesting right atrial pressure of 3 mmHg.   Comparison(s): No prior Echocardiogram.    CHA2DS2-VASc Score = 2  The patient's score is based upon: CHF History: 0 HTN History: 0 Diabetes History: 0 Stroke History: 0 Vascular Disease History: 0 Age Score: 2 Gender Score: 0       ASSESSMENT AND PLAN: Atypical atrial flutter The patient's CHA2DS2-VASc score is 2, indicating a 2.2% annual risk of stroke.   S/p PVI   of RSPV 08/05/22 Patient in atypical atrial flutter today, rate controlled.  We discussed rhythm control options. Will plan for repeat DCCV.  Check bmet/cbc Continue Eliquis 5 mg BID  Secondary Hypercoagulable State (ICD10:  D68.69) The patient is at significant risk  for stroke/thromboembolism based upon his CHA2DS2-VASc Score of 2.  Continue Apixaban (Eliquis).     Follow up in the AF clinic post DCCV.    Informed Consent   Shared Decision Making/Informed Consent The risks (stroke, cardiac arrhythmias rarely resulting in the need for a temporary or permanent pacemaker, skin irritation or burns and complications associated with conscious sedation including aspiration, arrhythmia, respiratory failure and death), benefits (restoration of normal sinus rhythm) and alternatives of a direct current cardioversion were explained in detail to Mr. Nilsen and he agrees to proceed.         Ricky Franceen Erisman PA-C [facility] [address] [place], [place] [address] [phone] 

## 2022-09-05 NOTE — Progress Notes (Signed)
Spoke to pt and instructed them to come at 0845 and to be NPO after 0000. Confirmed no missed doses of AC and instructed to take in AM with a small sip of water.   Confirmed that pt will have a ride home and someone to stay with them for 24 hours after the procedure. 

## 2022-09-06 ENCOUNTER — Ambulatory Visit (HOSPITAL_COMMUNITY)
Admission: RE | Admit: 2022-09-06 | Discharge: 2022-09-06 | Disposition: A | Discharge status: Home / Self Care | Payer: No Typology Code available for payment source | Attending: Internal Medicine | Admitting: Internal Medicine

## 2022-09-06 ENCOUNTER — Ambulatory Visit (HOSPITAL_COMMUNITY): Payer: No Typology Code available for payment source | Admitting: Anesthesiology

## 2022-09-06 ENCOUNTER — Encounter (HOSPITAL_COMMUNITY)
Admission: RE | Disposition: A | Discharge status: Home / Self Care | Payer: Self-pay | Source: Home / Self Care | Attending: Internal Medicine

## 2022-09-06 ENCOUNTER — Other Ambulatory Visit: Payer: Self-pay

## 2022-09-06 DIAGNOSIS — G4733 Obstructive sleep apnea (adult) (pediatric): Secondary | ICD-10-CM | POA: Insufficient documentation

## 2022-09-06 DIAGNOSIS — I484 Atypical atrial flutter: Secondary | ICD-10-CM | POA: Insufficient documentation

## 2022-09-06 DIAGNOSIS — I4892 Unspecified atrial flutter: Secondary | ICD-10-CM

## 2022-09-06 DIAGNOSIS — I4891 Unspecified atrial fibrillation: Secondary | ICD-10-CM

## 2022-09-06 DIAGNOSIS — D6869 Other thrombophilia: Secondary | ICD-10-CM | POA: Diagnosis not present

## 2022-09-06 DIAGNOSIS — Z7901 Long term (current) use of anticoagulants: Secondary | ICD-10-CM | POA: Insufficient documentation

## 2022-09-06 HISTORY — PX: CARDIOVERSION: SHX1299

## 2022-09-06 SURGERY — CARDIOVERSION
Anesthesia: General

## 2022-09-06 MED ORDER — PROPOFOL 10 MG/ML IV BOLUS
INTRAVENOUS | Status: DC | PRN
  Administered 2022-09-06: 30 mg via INTRAVENOUS
  Administered 2022-09-06: 50 mg via INTRAVENOUS
  Administered 2022-09-06: 20 mg via INTRAVENOUS

## 2022-09-06 MED ORDER — SODIUM CHLORIDE 0.9 % IV SOLN: INTRAVENOUS | Status: DC

## 2022-09-06 MED ORDER — LIDOCAINE HCL (CARDIAC) PF 100 MG/5ML IV SOSY
PREFILLED_SYRINGE | INTRAVENOUS | Status: DC | PRN
  Administered 2022-09-06: 60 mg via INTRAVENOUS

## 2022-09-06 SURGICAL SUPPLY — 1 items: ELECT DEFIB PAD ADLT CADENCE (PAD) ×1 IMPLANT

## 2022-09-06 NOTE — Anesthesia Postprocedure Evaluation (Signed)
Anesthesia Post Note  Patient: Kyle [name]  Procedure(s) Performed: CARDIOVERSION     Patient location during evaluation: PACU Anesthesia Type: General Level of consciousness: awake and alert Pain management: pain level controlled Vital Signs Assessment: post-procedure vital signs reviewed and stable Respiratory status: spontaneous breathing, nonlabored ventilation and respiratory function stable Cardiovascular status: stable and blood pressure returned to baseline Anesthetic complications: no   No notable events documented.  Last Vitals:  Vitals:   09/06/22 1150 09/06/22 1200  BP: 95/77 107/63  Pulse: 82 80  Resp: 19 19  Temp:    SpO2: 96% 96%    Last Pain:  Vitals:   09/06/22 1138  TempSrc: Temporal  PainSc: 0-No pain                 Beryle Lathe

## 2022-09-06 NOTE — Anesthesia Preprocedure Evaluation (Addendum)
Anesthesia Evaluation  Patient identified by MRN, date of birth, ID band Patient awake    Reviewed: Allergy & Precautions, NPO status , Patient's Chart, lab work & pertinent test results  History of Anesthesia Complications Negative for: history of anesthetic complications  Airway Mallampati: III  TM Distance: >3 FB Neck ROM: Full    Dental  (+) Dental Advisory Given   Pulmonary neg pulmonary ROS   Pulmonary exam normal        Cardiovascular + dysrhythmias Atrial Fibrillation  Rhythm:Irregular Rate:Normal   '24 Myoperfusion -   Findings are consistent with no ischemia. The study is low risk.   No ST deviation was noted.   LV perfusion is normal.   Left ventricular function is normal. End diastolic cavity size is mildly enlarged. End systolic cavity size is normal.  '24 TTE - Normal EF. Trivial AI.    Neuro/Psych  Neuromuscular disease  negative psych ROS   GI/Hepatic negative GI ROS, Neg liver ROS,,,  Endo/Other  negative endocrine ROS    Renal/GU negative Renal ROS     Musculoskeletal  (+) Arthritis ,    Abdominal   Peds  Hematology  (+) Blood dyscrasia, anemia  On eliquis    Anesthesia Other Findings   Reproductive/Obstetrics                             Anesthesia Physical Anesthesia Plan  ASA: 3  Anesthesia Plan: General   Post-op Pain Management: Minimal or no pain anticipated   Induction: Intravenous  PONV Risk Score and Plan: 2 and Treatment may vary due to age or medical condition and Propofol infusion  Airway Management Planned: Natural Airway and Mask  Additional Equipment: None  Intra-op Plan:   Post-operative Plan:   Informed Consent: I have reviewed the patients History and Physical, chart, labs and discussed the procedure including the risks, benefits and alternatives for the proposed anesthesia with the patient or authorized representative who has  indicated his/her understanding and acceptance.       Plan Discussed with: CRNA and Anesthesiologist  Anesthesia Plan Comments:        Anesthesia Quick Evaluation

## 2022-09-06 NOTE — Interval H&P Note (Signed)
History and Physical Interval Note:  09/06/2022 11:04 AM  Kyle [name]  has presented today for surgery, with the diagnosis of AFIB.  The various methods of treatment have been discussed with the patient and family. After consideration of risks, benefits and other options for treatment, the patient has consented to  Procedure(s): CARDIOVERSION (N/A) as a surgical intervention.  The patient's history has been reviewed, patient examined, no change in status, stable for surgery.  I have reviewed the patient's chart and labs.  Questions were answered to the patient's satisfaction.     Maisie Fus

## 2022-09-06 NOTE — Discharge Instructions (Signed)

## 2022-09-06 NOTE — Transfer of Care (Signed)
Immediate Anesthesia Transfer of Care Note  Patient: Kyle [name]  Procedure(s) Performed: CARDIOVERSION  Patient Location: PACU  Anesthesia Type:General  Level of Consciousness: sedated  Airway & Oxygen Therapy: Patient Spontanous Breathing and Patient connected to nasal cannula oxygen  Post-op Assessment: Report given to RN and Post -op Vital signs reviewed and stable  Post vital signs: stable  Last Vitals:  Vitals Value Taken Time  BP 153/75 09/06/22 1128  Temp    Pulse 79 09/06/22 1131  Resp 28 09/06/22 1131  SpO2 99 % 09/06/22 1131    Last Pain:  Vitals:   09/06/22 0906  TempSrc:   PainSc: 0-No pain         Complications: No notable events documented.

## 2022-09-06 NOTE — CV Procedure (Signed)
Procedure: Electrical Cardioversion Indications:  Atrial Flutter  Procedure Details:  Consent: Risks of procedure as well as the alternatives and risks of each were explained to the (patient/caregiver).  Consent for procedure obtained.  Time Out: Verified patient identification, verified procedure, site/side was marked, verified correct patient position, special equipment/implants available, medications/allergies/relevent history reviewed, required imaging and test results available. PERFORMED.  Patient placed on cardiac monitor, pulse oximetry, supplemental oxygen as necessary.  Sedation given:  propofol Pacer pads placed anterior and posterior chest.  Cardioverted 1 time(s).  Cardioversion with synchronized biphasic 120J shock.  Evaluation: Findings: Post procedure EKG shows: NSR Complications: None Patient did tolerate procedure well.  Time Spent Directly with the Patient:  15 minutes   Maisie Fus 09/06/2022, 11:33 AM

## 2022-09-09 ENCOUNTER — Telehealth: Payer: Self-pay | Admitting: Family Medicine

## 2022-09-09 ENCOUNTER — Encounter (HOSPITAL_COMMUNITY): Payer: Self-pay | Admitting: Internal Medicine

## 2022-09-09 NOTE — Telephone Encounter (Signed)
Spoke with Kyle [name].  He states has had diarrhea since Friday afternoon.  He originally thought he may have gotten food poison but the diarrhea has continued since Friday with taking Imodium as per instructions on the box. He states he has also been running a low grade fever between 99.4-100.2.  He did also had a 2nd cardioversion on Friday morning.  He denies any abdominal pain.  Appointment scheduled with Dr. Alphonsus Sias 09/10/2022 at 10:15 am.

## 2022-09-09 NOTE — Telephone Encounter (Signed)
Okay---I will assess him at the OV tomorrow Make sure he knows to keep up with oral fluids

## 2022-09-09 NOTE — Telephone Encounter (Signed)
Kyle [name] notified by telephone to make sure he is keeping up with his fluid intake until his appointment with Dr. Alphonsus Sias tomorrow.  Patient states understanding.

## 2022-09-09 NOTE — Telephone Encounter (Signed)
Pt called stating he has been experiencing diarrhea since last Fri afternoon, 7/12. Pt stated on Fri morning he had a second cardioversion. Pt states he took imodium, usually it helps, but not this time. Pt wants to ask Lupita Leash if she thinks he needs to be evaluated? Call back # [phone]

## 2022-09-10 ENCOUNTER — Ambulatory Visit (INDEPENDENT_AMBULATORY_CARE_PROVIDER_SITE_OTHER): Payer: No Typology Code available for payment source | Admitting: Internal Medicine

## 2022-09-10 ENCOUNTER — Encounter: Payer: Self-pay | Admitting: Internal Medicine

## 2022-09-10 VITALS — BP 112/60 | HR 78 | Temp 98.3°F | Ht 68.0 in | Wt 192.0 lb

## 2022-09-10 DIAGNOSIS — R197 Diarrhea, unspecified: Secondary | ICD-10-CM | POA: Insufficient documentation

## 2022-09-10 NOTE — Assessment & Plan Note (Signed)
Likely infectious Discussed stopping the imodium Hold fish oil and magnesium for now (and maybe all those supplements) High fat food---like peanut butter crackers Okay to try probiotic (yogurt) Keep up with fluids
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
## 2022-09-24 ENCOUNTER — Other Ambulatory Visit: Payer: Self-pay | Admitting: Family Medicine

## 2022-09-24 MED ORDER — GABAPENTIN 300 MG PO CAPS: 600.0000 mg | ORAL_CAPSULE | Freq: Two times a day (BID) | ORAL | 3 refills | Status: DC

## 2022-09-24 NOTE — Telephone Encounter (Signed)
Prescription Request  09/24/2022  LOV: 07/03/2022  What is the name of the medication or equipment? gabapentin (NEURONTIN) 300 MG capsule   Have you contacted your pharmacy to request a refill? No   Which pharmacy would you like this sent to?  [id number], [place] - [address] [place] [address] Phone: [phone] Fax: [phone]    Patient notified that their request is being sent to the clinical staff for review and that they should receive a response within 2 business days.   Please advise at [facility] [phone]

## 2022-09-24 NOTE — Telephone Encounter (Signed)
Last office visit 09/10/22 with Dr. Alphonsus Sias for diarrhea.  Last refilled -listed as historical medication.  Next Appt: CPE 04/21/2023.

## 2022-09-26 ENCOUNTER — Ambulatory Visit (HOSPITAL_COMMUNITY)
Admission: RE | Admit: 2022-09-26 | Discharge: 2022-09-26 | Disposition: A | Discharge status: Home / Self Care | Payer: No Typology Code available for payment source | Source: Ambulatory Visit | Attending: Physician Assistant | Admitting: Physician Assistant

## 2022-09-26 ENCOUNTER — Encounter (HOSPITAL_COMMUNITY): Payer: Self-pay | Admitting: Physician Assistant

## 2022-09-26 VITALS — BP 126/82 | HR 74 | Ht 68.0 in | Wt 195.4 lb

## 2022-09-26 DIAGNOSIS — Z7901 Long term (current) use of anticoagulants: Secondary | ICD-10-CM | POA: Insufficient documentation

## 2022-09-26 DIAGNOSIS — D6869 Other thrombophilia: Secondary | ICD-10-CM | POA: Insufficient documentation

## 2022-09-26 DIAGNOSIS — I484 Atypical atrial flutter: Secondary | ICD-10-CM | POA: Insufficient documentation

## 2022-09-26 DIAGNOSIS — I483 Typical atrial flutter: Secondary | ICD-10-CM | POA: Diagnosis not present

## 2022-09-26 DIAGNOSIS — G4733 Obstructive sleep apnea (adult) (pediatric): Secondary | ICD-10-CM | POA: Diagnosis not present

## 2022-09-26 DIAGNOSIS — I4892 Unspecified atrial flutter: Secondary | ICD-10-CM | POA: Diagnosis present

## 2022-09-26 NOTE — Progress Notes (Signed)
Primary Care Physician: Hannah Beat, MD Primary Cardiologist: Kristeen Miss, MD Electrophysiologist: Maurice Small, MD  Referring Physician: Dr Kyle [name] is a 77 y.o. male with a history of atrial flutter and OSA who presents for follow up in the [facility]. Patient originally saw Dr. Elease Hashimoto in 04/2022 due to atrial flutter. He was referred for cardioversion, which was successful. Echocardiogram showed normal LV function. He was diagnosed with sleep apnea and started CPAP. Patient found to be in recurrent atrial flutter on follow up and was referred to Dr Nelly Laurence for ablation. Patient is s/p RSPV isolation on 08/05/22. Patient is on Eliquis for a CHADS2VASC score of 2.  On follow up today, patient is s/p DCCV on 09/06/22. He remains in SR today. No bleeding issues on anticoagulation.   Today, he denies symptoms of palpitations, chest pain, shortness of breath, orthopnea, PND, lower extremity edema, dizziness, presyncope, syncope, snoring, daytime somnolence, bleeding, or neurologic sequela. The patient is tolerating medications without difficulties and is otherwise without complaint today.    Atrial Fibrillation Risk Factors:  he does have symptoms or diagnosis of sleep apnea. he is compliant with CPAP therapy. he does not have a history of rheumatic fever. he does have a history of alcohol use.   Atrial Fibrillation Management history:  Previous antiarrhythmic drugs: none Previous cardioversions: 05/30/22, 09/06/22 Previous ablations: 08/05/22 Anticoagulation history: Eliquis  ROS- All systems are reviewed and negative except as per the HPI above.   Physical Exam: BP 126/82   Pulse 74   Ht 5\' 8"  (1.727 m)   Wt 88.6 kg   BMI 29.71 kg/m   GEN: Well nourished, well developed in no acute distress NECK: No JVD; No carotid bruits CARDIAC: Regular rate and rhythm, no murmurs, rubs, gallops RESPIRATORY:  Clear to auscultation without  rales, wheezing or rhonchi  ABDOMEN: Soft, non-tender, non-distended EXTREMITIES:  No edema; No deformity    Wt Readings from Last 3 Encounters:  09/26/22 88.6 kg  09/10/22 87.1 kg  09/06/22 88 kg     EKG today demonstrates  SR, 1st degree AV block Vent. rate 74 BPM PR interval 202 ms QRS duration 84 ms QT/QTcB 398/441 ms  Echo 05/14/22 demonstrated  1. Left ventricular ejection fraction, by estimation, is 60 to 65%. The  left ventricle has normal function. The left ventricle has no regional  wall motion abnormalities. There is mild concentric left ventricular  hypertrophy. Left ventricular diastolic parameters are indeterminate.   2. Right ventricular systolic function is normal. The right ventricular  size is normal. Tricuspid regurgitation signal is inadequate for assessing  PA pressure.   3. Left atrial size was moderately dilated.   4. The mitral valve is degenerative. No evidence of mitral valve  regurgitation. Moderate mitral annular calcification.   5. Aortic valve regurgitation is trivial. Aortic valve  sclerosis/calcification is present, without any evidence of aortic  stenosis.   6. The inferior vena cava is normal in size with greater than 50%  respiratory variability, suggesting right atrial pressure of 3 mmHg.   Comparison(s): No prior Echocardiogram.    CHA2DS2-VASc Score = 2  The patient's score is based upon: CHF History: 0 HTN History: 0 Diabetes History: 0 Stroke History: 0 Vascular Disease History: 0 Age Score: 2 Gender Score: 0       ASSESSMENT AND PLAN: Atypical atrial flutter The patient's CHA2DS2-VASc score is 2, indicating a 2.2% annual risk of stroke.   S/p  PVI of RSPV 08/05/22 S/p DCCV on 09/06/22 Patient appears to be maintaining SR Continue Eliquis 5 mg BID  Secondary Hypercoagulable State (ICD10:  D68.69) The patient is at significant risk for stroke/thromboembolism based upon his CHA2DS2-VASc Score of 2.  Continue Apixaban  (Eliquis).     Follow up with Dr Nelly Laurence as scheduled.    Jorja Loa PA-C [facility] [address] [place], [place] [address] [phone]

## 2022-10-02 DIAGNOSIS — H04123 Dry eye syndrome of bilateral lacrimal glands: Secondary | ICD-10-CM | POA: Diagnosis not present

## 2022-10-02 DIAGNOSIS — Z9889 Other specified postprocedural states: Secondary | ICD-10-CM | POA: Diagnosis not present

## 2022-10-02 DIAGNOSIS — H2513 Age-related nuclear cataract, bilateral: Secondary | ICD-10-CM | POA: Diagnosis not present

## 2022-10-08 DIAGNOSIS — Z9889 Other specified postprocedural states: Secondary | ICD-10-CM | POA: Diagnosis not present

## 2022-10-08 DIAGNOSIS — H2513 Age-related nuclear cataract, bilateral: Secondary | ICD-10-CM | POA: Diagnosis not present

## 2022-10-10 ENCOUNTER — Encounter: Payer: Self-pay | Admitting: Ophthalmology

## 2022-10-10 NOTE — Anesthesia Preprocedure Evaluation (Addendum)
Anesthesia Evaluation  Patient identified by MRN, date of birth, ID band Patient awake    Reviewed: Allergy & Precautions, H&P , NPO status , Patient's Chart, lab work & pertinent test results  Airway Mallampati: III  TM Distance: >3 FB Neck ROM: Full    Dental no notable dental hx.  Couple missing:   Pulmonary neg pulmonary ROS, sleep apnea    Pulmonary exam normal breath sounds clear to auscultation       Cardiovascular negative cardio ROS Normal cardiovascular exam Rhythm:Regular Rate:Normal  05-14-22 1. Left ventricular ejection fraction, by estimation, is 60 to 65%. The  left ventricle has normal function. The left ventricle has no regional  wall motion abnormalities. There is mild concentric left ventricular  hypertrophy. Left ventricular diastolic  parameters are indeterminate.   2. Right ventricular systolic function is normal. The right ventricular  size is normal. Tricuspid regurgitation signal is inadequate for assessing  PA pressure.   3. Left atrial size was moderately dilated.   4. The mitral valve is degenerative. No evidence of mitral valve  regurgitation. Moderate mitral annular calcification.   5. Aortic valve regurgitation is trivial. Aortic valve  sclerosis/calcification is present, without any evidence of aortic  stenosis.   6. The inferior vena cava is normal in size with greater than 50%  respiratory variability, suggesting right atrial pressure of 3 mmHg.   '24 Myoperfusion -   Findings are consistent with no ischemia. The study is low risk.   No ST deviation was noted.   LV perfusion is normal.   Left ventricular function is normal. End diastolic cavity size is mildly enlarged. End systolic cavity size is normal.   '24 TTE - Normal EF. Trivial AI.   Cardioversion 09-06-22    Neuro/Psych  Neuromuscular disease negative neurological ROS  negative psych ROS   GI/Hepatic negative GI ROS, Neg  liver ROS,,,  Endo/Other  negative endocrine ROS    Renal/GU negative Renal ROS  negative genitourinary   Musculoskeletal negative musculoskeletal ROS (+) Arthritis ,    Abdominal   Peds negative pediatric ROS (+)  Hematology negative hematology ROS (+)   Anesthesia Other Findings Neuromuscular disorder (HCC)  Arthritis Diverticulitis  H/O atrial flutter, hx cardioversion Wears dentures  Sleep apnea on CPAP    Reproductive/Obstetrics negative OB ROS                              Anesthesia Physical Anesthesia Plan  ASA: 3  Anesthesia Plan: MAC   Post-op Pain Management:    Induction: Intravenous  PONV Risk Score and Plan:   Airway Management Planned: Natural Airway and Nasal Cannula  Additional Equipment:   Intra-op Plan:   Post-operative Plan:   Informed Consent: I have reviewed the patients History and Physical, chart, labs and discussed the procedure including the risks, benefits and alternatives for the proposed anesthesia with the patient or authorized representative who has indicated his/her understanding and acceptance.     Dental Advisory Given  Plan Discussed with: Anesthesiologist, CRNA and Surgeon  Anesthesia Plan Comments: (Patient consented for risks of anesthesia including but not limited to:  - adverse reactions to medications - damage to eyes, teeth, lips or other oral mucosa - nerve damage due to positioning  - sore throat or hoarseness - Damage to heart, brain, nerves, lungs, other parts of body or loss of life  Patient voiced understanding.)  Anesthesia Quick Evaluation

## 2022-10-15 ENCOUNTER — Encounter: Payer: Self-pay | Admitting: Orthopaedic Surgery

## 2022-10-15 ENCOUNTER — Other Ambulatory Visit: Payer: Self-pay | Admitting: Orthopaedic Surgery

## 2022-10-15 DIAGNOSIS — M19011 Primary osteoarthritis, right shoulder: Secondary | ICD-10-CM | POA: Diagnosis not present

## 2022-10-15 DIAGNOSIS — Z01818 Encounter for other preprocedural examination: Secondary | ICD-10-CM

## 2022-10-16 ENCOUNTER — Encounter: Payer: Self-pay | Admitting: Ophthalmology

## 2022-10-16 ENCOUNTER — Ambulatory Visit
Admission: RE | Admit: 2022-10-16 | Discharge: 2022-10-16 | Disposition: A | Discharge status: Home / Self Care | Payer: No Typology Code available for payment source | Attending: Ophthalmology | Admitting: Ophthalmology

## 2022-10-16 ENCOUNTER — Encounter
Admission: RE | Disposition: A | Discharge status: Home / Self Care | Payer: Self-pay | Source: Home / Self Care | Attending: Ophthalmology

## 2022-10-16 ENCOUNTER — Ambulatory Visit: Payer: No Typology Code available for payment source | Admitting: Anesthesiology

## 2022-10-16 ENCOUNTER — Other Ambulatory Visit: Payer: Self-pay

## 2022-10-16 DIAGNOSIS — H2511 Age-related nuclear cataract, right eye: Secondary | ICD-10-CM | POA: Insufficient documentation

## 2022-10-16 DIAGNOSIS — M1990 Unspecified osteoarthritis, unspecified site: Secondary | ICD-10-CM | POA: Diagnosis not present

## 2022-10-16 DIAGNOSIS — G4733 Obstructive sleep apnea (adult) (pediatric): Secondary | ICD-10-CM | POA: Insufficient documentation

## 2022-10-16 DIAGNOSIS — H269 Unspecified cataract: Secondary | ICD-10-CM | POA: Diagnosis not present

## 2022-10-16 DIAGNOSIS — I484 Atypical atrial flutter: Secondary | ICD-10-CM | POA: Diagnosis not present

## 2022-10-16 HISTORY — DX: Sleep apnea, unspecified: G47.30

## 2022-10-16 HISTORY — PX: CATARACT EXTRACTION W/PHACO: SHX586

## 2022-10-16 HISTORY — DX: Personal history of other diseases of the circulatory system: Z86.79

## 2022-10-16 HISTORY — DX: Obstructive sleep apnea (adult) (pediatric): G47.33

## 2022-10-16 HISTORY — DX: Presence of dental prosthetic device (complete) (partial): Z97.2

## 2022-10-16 SURGERY — PHACOEMULSIFICATION, CATARACT, WITH IOL INSERTION
Anesthesia: Monitor Anesthesia Care | Laterality: Right

## 2022-10-16 MED ORDER — FENTANYL CITRATE (PF) 100 MCG/2ML IJ SOLN
INTRAMUSCULAR | Status: DC | PRN
  Administered 2022-10-16 (×2): 50 ug via INTRAVENOUS

## 2022-10-16 MED ORDER — SIGHTPATH DOSE#1 BSS IO SOLN
INTRAOCULAR | Status: DC | PRN
  Administered 2022-10-16: 44 mL via OPHTHALMIC

## 2022-10-16 MED ORDER — CEFUROXIME OPHTHALMIC INJECTION 1 MG/0.1 ML
INJECTION | OPHTHALMIC | Status: DC | PRN
  Administered 2022-10-16: 1 mg via INTRACAMERAL

## 2022-10-16 MED ORDER — MIDAZOLAM HCL 2 MG/2ML IJ SOLN
INTRAMUSCULAR | Status: DC | PRN
  Administered 2022-10-16: 2 mg via INTRAVENOUS

## 2022-10-16 MED ORDER — SIGHTPATH DOSE#1 NA HYALUR & NA CHOND-NA HYALUR IO KIT
PACK | INTRAOCULAR | Status: DC | PRN
  Administered 2022-10-16: 1 via OPHTHALMIC

## 2022-10-16 MED ORDER — TETRACAINE HCL 0.5 % OP SOLN
1.0000 [drp] | OPHTHALMIC | Status: DC | PRN
  Administered 2022-10-16 (×3): 1 [drp] via OPHTHALMIC

## 2022-10-16 MED ORDER — LACTATED RINGERS IV SOLN: INTRAVENOUS | Status: DC

## 2022-10-16 MED ORDER — SIGHTPATH DOSE#1 BSS IO SOLN
INTRAOCULAR | Status: DC | PRN
  Administered 2022-10-16: 15 mL via INTRAOCULAR

## 2022-10-16 MED ORDER — ARMC OPHTHALMIC DILATING DROPS
1.0000 | OPHTHALMIC | Status: DC | PRN
  Administered 2022-10-16 (×3): 1 via OPHTHALMIC

## 2022-10-16 MED ORDER — BRIMONIDINE TARTRATE-TIMOLOL 0.2-0.5 % OP SOLN
OPHTHALMIC | Status: DC | PRN
  Administered 2022-10-16: 1 [drp] via OPHTHALMIC

## 2022-10-16 MED ORDER — SIGHTPATH DOSE#1 BSS IO SOLN
INTRAOCULAR | Status: DC | PRN
  Administered 2022-10-16: 2 mL

## 2022-10-16 SURGICAL SUPPLY — 9 items
CATARACT SUITE SIGHTPATH (MISCELLANEOUS) ×1
FEE CATARACT SUITE SIGHTPATH (MISCELLANEOUS) ×1 IMPLANT
GLOVE SRG 8 PF TXTR STRL LF DI (GLOVE) ×1 IMPLANT
GLOVE SURG ENC TEXT LTX SZ7.5 (GLOVE) ×1 IMPLANT
GLOVE SURG UNDER POLY LF SZ8 (GLOVE) ×1
LENS IOL TECNIS EYHANCE 19.0 (Intraocular Lens) IMPLANT
NDL FILTER BLUNT 18X1 1/2 (NEEDLE) ×1 IMPLANT
NEEDLE FILTER BLUNT 18X1 1/2 (NEEDLE) ×1
SYR 3ML LL SCALE MARK (SYRINGE) ×1 IMPLANT

## 2022-10-16 NOTE — Op Note (Signed)
LOCATION:  [facility]   PREOPERATIVE DIAGNOSIS:    Nuclear sclerotic cataract right eye. H25.11   POSTOPERATIVE DIAGNOSIS:  Nuclear sclerotic cataract right eye.     PROCEDURE:  Phacoemusification with posterior chamber intraocular lens placement of the right eye   ULTRASOUND TIME: Procedure(s): CATARACT EXTRACTION PHACO AND INTRAOCULAR LENS PLACEMENT (IOC) RIGHT 7.13 00:50.7 (Right)  LENS:   Implant Name Type Inv. Item Serial No. Manufacturer Lot No. LRB No. Used Action  LENS IOL TECNIS EYHANCE 19.0 - [id number] Intraocular Lens LENS IOL TECNIS EYHANCE 19.0 [id number] [employer]  Right 1 Implanted         SURGEON:  Deirdre Evener, MD   ANESTHESIA:  Topical with tetracaine drops and 2% Xylocaine jelly, augmented with 1% preservative-free intracameral lidocaine.    COMPLICATIONS:  Zonular dialysis nasally without vitreous loss   DESCRIPTION OF PROCEDURE:  The patient was identified in the holding room and transported to the operating room and placed in the supine position under the operating microscope.  The right eye was identified as the operative eye and it was prepped and draped in the usual sterile ophthalmic fashion.   A 1 millimeter clear-corneal paracentesis was made at the 12:00 position.  0.5 ml of preservative-free 1% lidocaine was injected into the anterior chamber. The anterior chamber was filled with Viscoat viscoelastic.  A 2.4 millimeter keratome was used to make a near-clear corneal incision at the 9:00 position.  A curvilinear capsulorrhexis was made with a cystotome and capsulorrhexis forceps.  Balanced salt solution was used to hydrodissect and hydrodelineate the nucleus.   Phacoemulsification was then used in stop and chop fashion to remove the lens nucleus and epinucleus.  The remaining cortex was then removed using the irrigation and aspiration handpiece. Provisc was then placed into the capsular bag to distend it for lens placement.  A lens was  then injected into the capsular bag.  The remaining viscoelastic was aspirated.  Zonular weakness and about two clock hours of dialysis were noted nasally.  There was no vitreous loss.  The implant remained centered.   Wounds were hydrated with balanced salt solution.  The anterior chamber was inflated to a physiologic pressure with balanced salt solution.  No wound leaks were noted. Cefuroxime 0.1 ml of a 10mg /ml solution was injected into the anterior chamber for a dose of 1 mg of intracameral antibiotic at the completion of the case.   Timolol and Brimonidine drops were applied to the eye.  The patient was taken to the recovery room in stable condition without complications of anesthesia or surgery.   Maicie Vanderloop 10/16/2022, 10:59 AM

## 2022-10-16 NOTE — Transfer of Care (Signed)
Immediate Anesthesia Transfer of Care Note  Patient: Kyle [name]  Procedure(s) Performed: CATARACT EXTRACTION PHACO AND INTRAOCULAR LENS PLACEMENT (IOC) RIGHT 7.13 00:50.7 (Right)  Patient Location: PACU  Anesthesia Type: MAC  Level of Consciousness: awake, alert  and patient cooperative  Airway and Oxygen Therapy: Patient Spontanous Breathing and Patient connected to supplemental oxygen  Post-op Assessment: Post-op Vital signs reviewed, Patient's Cardiovascular Status Stable, Respiratory Function Stable, Patent Airway and No signs of Nausea or vomiting  Post-op Vital Signs: Reviewed and stable  Complications: No notable events documented.

## 2022-10-16 NOTE — Anesthesia Postprocedure Evaluation (Signed)
Anesthesia Post Note  Patient: Kyle [name]  Procedure(s) Performed: CATARACT EXTRACTION PHACO AND INTRAOCULAR LENS PLACEMENT (IOC) RIGHT 7.13 00:50.7 (Right)  Patient location during evaluation: PACU Anesthesia Type: MAC Level of consciousness: awake and alert Pain management: pain level controlled Vital Signs Assessment: post-procedure vital signs reviewed and stable Respiratory status: spontaneous breathing, nonlabored ventilation, respiratory function stable and patient connected to nasal cannula oxygen Cardiovascular status: stable and blood pressure returned to baseline Postop Assessment: no apparent nausea or vomiting Anesthetic complications: no   No notable events documented.   Last Vitals:  Vitals:   10/16/22 1100 10/16/22 1106  BP: 112/74 120/75  Pulse: 71   Resp: 18   Temp: 36.5 C (!) 36.4 C  SpO2: 96%     Last Pain:  Vitals:   10/16/22 1106  TempSrc:   PainSc: 0-No pain                 Briannah Lona C Dunya Meiners
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
## 2022-10-17 ENCOUNTER — Encounter: Payer: Self-pay | Admitting: Ophthalmology

## 2022-10-17 DIAGNOSIS — H2512 Age-related nuclear cataract, left eye: Secondary | ICD-10-CM | POA: Diagnosis not present

## 2022-10-18 ENCOUNTER — Ambulatory Visit
Admission: RE | Admit: 2022-10-18 | Discharge: 2022-10-18 | Disposition: A | Discharge status: Home / Self Care | Payer: No Typology Code available for payment source | Source: Ambulatory Visit | Attending: Orthopaedic Surgery | Admitting: Orthopaedic Surgery

## 2022-10-18 DIAGNOSIS — M5385 Other specified dorsopathies, thoracolumbar region: Secondary | ICD-10-CM | POA: Diagnosis not present

## 2022-10-18 DIAGNOSIS — M5117 Intervertebral disc disorders with radiculopathy, lumbosacral region: Secondary | ICD-10-CM | POA: Diagnosis not present

## 2022-10-18 DIAGNOSIS — M9903 Segmental and somatic dysfunction of lumbar region: Secondary | ICD-10-CM | POA: Diagnosis not present

## 2022-10-18 DIAGNOSIS — Z96611 Presence of right artificial shoulder joint: Secondary | ICD-10-CM | POA: Diagnosis not present

## 2022-10-18 DIAGNOSIS — Z01818 Encounter for other preprocedural examination: Secondary | ICD-10-CM

## 2022-10-18 DIAGNOSIS — G8929 Other chronic pain: Secondary | ICD-10-CM | POA: Diagnosis not present

## 2022-10-18 DIAGNOSIS — M25511 Pain in right shoulder: Secondary | ICD-10-CM | POA: Diagnosis not present

## 2022-10-18 DIAGNOSIS — M9901 Segmental and somatic dysfunction of cervical region: Secondary | ICD-10-CM | POA: Diagnosis not present

## 2022-10-18 DIAGNOSIS — M9905 Segmental and somatic dysfunction of pelvic region: Secondary | ICD-10-CM | POA: Diagnosis not present

## 2022-10-18 DIAGNOSIS — M9902 Segmental and somatic dysfunction of thoracic region: Secondary | ICD-10-CM | POA: Diagnosis not present

## 2022-10-21 NOTE — Anesthesia Preprocedure Evaluation (Addendum)
Anesthesia Evaluation  Patient identified by MRN, date of birth, ID band Patient awake    Reviewed: Allergy & Precautions, H&P , NPO status , Patient's Chart, lab work & pertinent test results  Airway Mallampati: III  TM Distance: >3 FB Neck ROM: Full    Dental no notable dental hx.    Pulmonary neg pulmonary ROS, sleep apnea    Pulmonary exam normal breath sounds clear to auscultation       Cardiovascular + dysrhythmias Atrial Fibrillation  Rhythm:Regular Rate:Normal  05-14-22 1. Left ventricular ejection fraction, by estimation, is 60 to 65%. The  left ventricle has normal function. The left ventricle has no regional  wall motion abnormalities. There is mild concentric left ventricular  hypertrophy. Left ventricular diastolic  parameters are indeterminate.   2. Right ventricular systolic function is normal. The right ventricular  size is normal. Tricuspid regurgitation signal is inadequate for assessing  PA pressure.   3. Left atrial size was moderately dilated.   4. The mitral valve is degenerative. No evidence of mitral valve  regurgitation. Moderate mitral annular calcification.   5. Aortic valve regurgitation is trivial. Aortic valve  sclerosis/calcification is present, without any evidence of aortic  stenosis.   6. The inferior vena cava is normal in size with greater than 50%  respiratory variability, suggesting right atrial pressure of 3 mmHg.    '24 Myoperfusion -   Findings are consistent with no ischemia. The study is low risk.   No ST deviation was noted.   LV perfusion is normal.   Left ventricular function is normal. End diastolic cavity size is mildly enlarged. End systolic cavity size is normal.   '24 TTE - Normal EF. Trivial AI.   Cardioversion 09-06-22 Hx A fib/flutter just prior to incision for back surgery and that was canceled.  Has had two cardioversions since then.  Has OSA/CPAP         Neuro/Psych  Neuromuscular disease negative neurological ROS  negative psych ROS   GI/Hepatic negative GI ROS, Neg liver ROS,,,  Endo/Other  negative endocrine ROS    Renal/GU negative Renal ROS  negative genitourinary   Musculoskeletal negative musculoskeletal ROS (+) Arthritis ,    Abdominal   Peds negative pediatric ROS (+)  Hematology negative hematology ROS (+)   Anesthesia Other Findings Neuromuscular disorder (HCC)  Arthritis Diverticulitis  H/O atrial flutter Wears dentures  OSA on CPAP  Previous cataract surgery 10-16-22   Reproductive/Obstetrics negative OB ROS                             Anesthesia Physical Anesthesia Plan  ASA: 3  Anesthesia Plan: MAC   Post-op Pain Management:    Induction: Intravenous  PONV Risk Score and Plan:   Airway Management Planned: Natural Airway and Nasal Cannula  Additional Equipment:   Intra-op Plan:   Post-operative Plan:   Informed Consent: I have reviewed the patients History and Physical, chart, labs and discussed the procedure including the risks, benefits and alternatives for the proposed anesthesia with the patient or authorized representative who has indicated his/her understanding and acceptance.     Dental Advisory Given  Plan Discussed with: Anesthesiologist, CRNA and Surgeon  Anesthesia Plan Comments: (Patient consented for risks of anesthesia including but not limited to:  - adverse reactions to medications - damage to eyes, teeth, lips or other oral mucosa - nerve damage due to positioning  - sore throat or hoarseness -  Damage to heart, brain, nerves, lungs, other parts of body or loss of life  Patient voiced understanding.)        Anesthesia Quick Evaluation

## 2022-10-25 NOTE — Discharge Instructions (Signed)

## 2022-10-27 DIAGNOSIS — G4733 Obstructive sleep apnea (adult) (pediatric): Secondary | ICD-10-CM | POA: Diagnosis not present

## 2022-10-27 DIAGNOSIS — I483 Typical atrial flutter: Secondary | ICD-10-CM | POA: Diagnosis not present

## 2022-10-30 ENCOUNTER — Other Ambulatory Visit: Payer: Self-pay

## 2022-10-30 ENCOUNTER — Ambulatory Visit
Admission: RE | Admit: 2022-10-30 | Discharge: 2022-10-30 | Disposition: A | Discharge status: Home / Self Care | Payer: No Typology Code available for payment source | Attending: Ophthalmology | Admitting: Ophthalmology

## 2022-10-30 ENCOUNTER — Ambulatory Visit: Payer: No Typology Code available for payment source | Admitting: Anesthesiology

## 2022-10-30 ENCOUNTER — Encounter: Payer: Self-pay | Admitting: Ophthalmology

## 2022-10-30 ENCOUNTER — Encounter
Admission: RE | Disposition: A | Discharge status: Home / Self Care | Payer: Self-pay | Source: Home / Self Care | Attending: Ophthalmology

## 2022-10-30 DIAGNOSIS — I4892 Unspecified atrial flutter: Secondary | ICD-10-CM | POA: Diagnosis not present

## 2022-10-30 DIAGNOSIS — I3481 Nonrheumatic mitral (valve) annulus calcification: Secondary | ICD-10-CM | POA: Diagnosis not present

## 2022-10-30 DIAGNOSIS — H2512 Age-related nuclear cataract, left eye: Secondary | ICD-10-CM | POA: Diagnosis not present

## 2022-10-30 DIAGNOSIS — I700 Atherosclerosis of aorta: Secondary | ICD-10-CM | POA: Insufficient documentation

## 2022-10-30 DIAGNOSIS — I4891 Unspecified atrial fibrillation: Secondary | ICD-10-CM | POA: Diagnosis not present

## 2022-10-30 DIAGNOSIS — G4733 Obstructive sleep apnea (adult) (pediatric): Secondary | ICD-10-CM | POA: Insufficient documentation

## 2022-10-30 DIAGNOSIS — H269 Unspecified cataract: Secondary | ICD-10-CM | POA: Diagnosis not present

## 2022-10-30 HISTORY — PX: CATARACT EXTRACTION W/PHACO: SHX586

## 2022-10-30 SURGERY — PHACOEMULSIFICATION, CATARACT, WITH IOL INSERTION
Anesthesia: Monitor Anesthesia Care | Laterality: Left

## 2022-10-30 MED ORDER — BRIMONIDINE TARTRATE-TIMOLOL 0.2-0.5 % OP SOLN
OPHTHALMIC | Status: DC | PRN
  Administered 2022-10-30: 1 [drp] via OPHTHALMIC

## 2022-10-30 MED ORDER — ARMC OPHTHALMIC DILATING DROPS
1.0000 | OPHTHALMIC | Status: DC | PRN
  Administered 2022-10-30 (×3): 1 via OPHTHALMIC

## 2022-10-30 MED ORDER — CEFUROXIME OPHTHALMIC INJECTION 1 MG/0.1 ML
INJECTION | OPHTHALMIC | Status: DC | PRN
  Administered 2022-10-30: 1 mg via INTRACAMERAL

## 2022-10-30 MED ORDER — FENTANYL CITRATE (PF) 100 MCG/2ML IJ SOLN
INTRAMUSCULAR | Status: DC | PRN
  Administered 2022-10-30 (×2): 50 ug via INTRAVENOUS

## 2022-10-30 MED ORDER — SIGHTPATH DOSE#1 BSS IO SOLN
INTRAOCULAR | Status: DC | PRN
  Administered 2022-10-30: 2 mL

## 2022-10-30 MED ORDER — SIGHTPATH DOSE#1 BSS IO SOLN
INTRAOCULAR | Status: DC | PRN
  Administered 2022-10-30: 90 mL via OPHTHALMIC

## 2022-10-30 MED ORDER — LACTATED RINGERS IV SOLN: INTRAVENOUS | Status: DC

## 2022-10-30 MED ORDER — TETRACAINE HCL 0.5 % OP SOLN
1.0000 [drp] | OPHTHALMIC | Status: DC | PRN
  Administered 2022-10-30 (×3): 1 [drp] via OPHTHALMIC

## 2022-10-30 MED ORDER — SIGHTPATH DOSE#1 BSS IO SOLN
INTRAOCULAR | Status: DC | PRN
  Administered 2022-10-30: 15 mL via INTRAOCULAR

## 2022-10-30 MED ORDER — SIGHTPATH DOSE#1 NA HYALUR & NA CHOND-NA HYALUR IO KIT
PACK | INTRAOCULAR | Status: DC | PRN
  Administered 2022-10-30: 1 via OPHTHALMIC

## 2022-10-30 MED ORDER — MIDAZOLAM HCL 2 MG/2ML IJ SOLN
INTRAMUSCULAR | Status: DC | PRN
  Administered 2022-10-30 (×2): 1 mg via INTRAVENOUS

## 2022-10-30 SURGICAL SUPPLY — 9 items
CATARACT SUITE SIGHTPATH (MISCELLANEOUS) ×1
FEE CATARACT SUITE SIGHTPATH (MISCELLANEOUS) ×1 IMPLANT
GLOVE SRG 8 PF TXTR STRL LF DI (GLOVE) ×1 IMPLANT
GLOVE SURG ENC TEXT LTX SZ7.5 (GLOVE) ×1 IMPLANT
GLOVE SURG UNDER POLY LF SZ8 (GLOVE) ×1
LENS IOL TECNIS EYHANCE 19.0 (Intraocular Lens) IMPLANT
NDL FILTER BLUNT 18X1 1/2 (NEEDLE) ×1 IMPLANT
NEEDLE FILTER BLUNT 18X1 1/2 (NEEDLE) ×1
SYR 3ML LL SCALE MARK (SYRINGE) ×1 IMPLANT

## 2022-10-30 NOTE — Anesthesia Postprocedure Evaluation (Signed)
Anesthesia Post Note  Patient: Kyle [name]  Procedure(s) Performed: CATARACT EXTRACTION PHACO AND INTRAOCULAR LENS PLACEMENT (IOC) LEFT 5.08 00:37.5 (Left)  Patient location during evaluation: PACU Anesthesia Type: MAC Level of consciousness: awake and alert Pain management: pain level controlled Vital Signs Assessment: post-procedure vital signs reviewed and stable Respiratory status: spontaneous breathing, nonlabored ventilation, respiratory function stable and patient connected to nasal cannula oxygen Cardiovascular status: stable and blood pressure returned to baseline Postop Assessment: no apparent nausea or vomiting Anesthetic complications: no   No notable events documented.   Last Vitals:  Vitals:   10/30/22 1126 10/30/22 1130  BP: 122/86 135/80  Pulse: 71 67  Resp: 11 13  Temp: 36.8 C   SpO2: 98% 97%    Last Pain:  Vitals:   10/30/22 1130  TempSrc:   PainSc: 0-No pain                 Davianna Deutschman C Miosotis Wetsel

## 2022-10-30 NOTE — Op Note (Signed)
OPERATIVE NOTE  Kyle [name] [id number] 10/30/2022   PREOPERATIVE DIAGNOSIS:  Nuclear sclerotic cataract left eye. H25.12   POSTOPERATIVE DIAGNOSIS:    Nuclear sclerotic cataract left eye.     PROCEDURE:  Phacoemusification with posterior chamber intraocular lens placement of the left eye  Ultrasound time: Procedure(s): CATARACT EXTRACTION PHACO AND INTRAOCULAR LENS PLACEMENT (IOC) LEFT 5.08 00:37.5 (Left)  LENS:   Implant Name Type Inv. Item Serial No. Manufacturer Lot No. LRB No. Used Action  LENS IOL TECNIS EYHANCE 19.0 - [id number] Intraocular Lens LENS IOL TECNIS EYHANCE 19.0 [id number] [employer]  Left 1 Implanted      SURGEON:  Deirdre Evener, MD   ANESTHESIA:  Topical with tetracaine drops and 2% Xylocaine jelly, augmented with 1% preservative-free intracameral lidocaine.    COMPLICATIONS:  None.   DESCRIPTION OF PROCEDURE:  The patient was identified in the holding room and transported to the operating room and placed in the supine position under the operating microscope.  The left eye was identified as the operative eye and it was prepped and draped in the usual sterile ophthalmic fashion.   A 1 millimeter clear-corneal paracentesis was made at the 1:30 position.  0.5 ml of preservative-free 1% lidocaine was injected into the anterior chamber.  The anterior chamber was filled with Viscoat viscoelastic.  A 2.4 millimeter keratome was used to make a near-clear corneal incision at the 10:30 position.  .  A curvilinear capsulorrhexis was made with a cystotome and capsulorrhexis forceps.  Balanced salt solution was used to hydrodissect and hydrodelineate the nucleus.   Phacoemulsification was then used in stop and chop fashion to remove the lens nucleus and epinucleus.  The remaining cortex was then removed using the irrigation and aspiration handpiece. Provisc was then placed into the capsular bag to distend it for lens placement.  A lens was then injected into the  capsular bag.  The remaining viscoelastic was aspirated.   Wounds were hydrated with balanced salt solution.  The anterior chamber was inflated to a physiologic pressure with balanced salt solution.  No wound leaks were noted. Cefuroxime 0.1 ml of a 10mg /ml solution was injected into the anterior chamber for a dose of 1 mg of intracameral antibiotic at the completion of the case.   Timolol and Brimonidine drops were applied to the eye.  The patient was taken to the recovery room in stable condition without complications of anesthesia or surgery.  [name] 10/30/2022, 11:24 AM
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
## 2022-10-30 NOTE — Transfer of Care (Signed)
Immediate Anesthesia Transfer of Care Note  Patient: Kyle [name]  Procedure(s) Performed: CATARACT EXTRACTION PHACO AND INTRAOCULAR LENS PLACEMENT (IOC) LEFT 5.08 00:37.5 (Left)  Patient Location: PACU  Anesthesia Type: MAC  Level of Consciousness: awake, alert  and patient cooperative  Airway and Oxygen Therapy: Patient Spontanous Breathing and Patient connected to supplemental oxygen  Post-op Assessment: Post-op Vital signs reviewed, Patient's Cardiovascular Status Stable, Respiratory Function Stable, Patent Airway and No signs of Nausea or vomiting  Post-op Vital Signs: Reviewed and stable  Complications: No notable events documented.

## 2022-11-02 ENCOUNTER — Encounter: Payer: Self-pay | Admitting: Ophthalmology

## 2022-11-05 ENCOUNTER — Telehealth: Payer: Self-pay | Admitting: Family Medicine

## 2022-11-05 DIAGNOSIS — Z0000 Encounter for general adult medical examination without abnormal findings: Secondary | ICD-10-CM

## 2022-11-05 DIAGNOSIS — E782 Mixed hyperlipidemia: Secondary | ICD-10-CM

## 2022-11-05 NOTE — Telephone Encounter (Signed)
Spoke with Kyle [name].  He states with his heart history he would like to get a calcium scan.  He is asking if Dr. Patsy Lager can order this for him or does he need to go through his cardiologist.  He prefers [place] location.  Please advise.

## 2022-11-05 NOTE — Telephone Encounter (Signed)
Kyle [name], can you call Kyle [name].  I am not sure that he needs to get a coronary calcium score, since he just had a normal nuclear stress test.  I am going to send a note to Dr. Elease Hashimoto to see if he thinks that Kyle [name] needs any additional testing.   Thanks, Phil.

## 2022-11-05 NOTE — Telephone Encounter (Signed)
Kyle [name] notified as instructed by telephone.  He states he wasn't aware that he had a nuclear stress test but will await response from Dr. Elease Hashimoto.

## 2022-11-05 NOTE — Telephone Encounter (Signed)
Pt called requesting a call back from Lupita Leash to discuss a order for calcium scan? Call back # [phone]

## 2022-11-07 DIAGNOSIS — M9905 Segmental and somatic dysfunction of pelvic region: Secondary | ICD-10-CM | POA: Diagnosis not present

## 2022-11-07 DIAGNOSIS — M9903 Segmental and somatic dysfunction of lumbar region: Secondary | ICD-10-CM | POA: Diagnosis not present

## 2022-11-07 DIAGNOSIS — M9902 Segmental and somatic dysfunction of thoracic region: Secondary | ICD-10-CM | POA: Diagnosis not present

## 2022-11-07 DIAGNOSIS — M9901 Segmental and somatic dysfunction of cervical region: Secondary | ICD-10-CM | POA: Diagnosis not present

## 2022-11-08 ENCOUNTER — Ambulatory Visit: Payer: No Typology Code available for payment source | Admitting: Cardiovascular Disease

## 2022-11-08 NOTE — Addendum Note (Signed)
Addended by: Lars Mage on: 11/08/2022 01:54 PM   Modules accepted: Orders

## 2022-11-08 NOTE — Telephone Encounter (Signed)
Order placed for coronary calcium score CT and called and spoke with patient. He wants to wait until after CT results are available before scheduling follow-up appt.   Nahser, Deloris Ping, MD  Copland, Karleen Hampshire, MD; Copland Pool; Cv Div Ch St TriageYesterday (1:07 PM)    We typically will get a calcium score on anyone who asks for it .  You can get them through [facility] screening without any doctors order  It could potentially change how aggressive we should be with his lipids. Its comforting that he had a negative myoview study  We will order it and have him follow up with me or my APP in several months  Triage, will you order a coronary calcium score on Mr. Schoeff. Follow up with me or Eligha Bridegroom, NP in several months ( or on open slot sooner if needed )   PN

## 2022-11-11 DIAGNOSIS — M5416 Radiculopathy, lumbar region: Secondary | ICD-10-CM | POA: Diagnosis not present

## 2022-11-12 ENCOUNTER — Ambulatory Visit
Admission: RE | Admit: 2022-11-12 | Discharge: 2022-11-12 | Disposition: A | Discharge status: Home / Self Care | Payer: No Typology Code available for payment source | Source: Ambulatory Visit | Attending: Cardiovascular Disease | Admitting: Cardiovascular Disease

## 2022-11-12 ENCOUNTER — Telehealth: Payer: Self-pay | Admitting: Cardiovascular Disease

## 2022-11-12 DIAGNOSIS — Z0000 Encounter for general adult medical examination without abnormal findings: Secondary | ICD-10-CM | POA: Insufficient documentation

## 2022-11-12 DIAGNOSIS — E782 Mixed hyperlipidemia: Secondary | ICD-10-CM | POA: Insufficient documentation

## 2022-11-12 NOTE — Telephone Encounter (Signed)
Patient would like results of CT. Did advise once provider finalize results we can call with results and recommendations.

## 2022-11-12 NOTE — Telephone Encounter (Signed)
Patient had CT Cal score test done today, and got his results on Mychart.  He states they are very alarming and would like for Dr. Elease Hashimoto to take a look at them.

## 2022-11-13 DIAGNOSIS — M9902 Segmental and somatic dysfunction of thoracic region: Secondary | ICD-10-CM | POA: Diagnosis not present

## 2022-11-13 DIAGNOSIS — M9901 Segmental and somatic dysfunction of cervical region: Secondary | ICD-10-CM | POA: Diagnosis not present

## 2022-11-13 DIAGNOSIS — M9903 Segmental and somatic dysfunction of lumbar region: Secondary | ICD-10-CM | POA: Diagnosis not present

## 2022-11-13 DIAGNOSIS — M9905 Segmental and somatic dysfunction of pelvic region: Secondary | ICD-10-CM | POA: Diagnosis not present

## 2022-11-14 ENCOUNTER — Telehealth: Payer: Self-pay | Admitting: Cardiovascular Disease

## 2022-11-14 DIAGNOSIS — E785 Hyperlipidemia, unspecified: Secondary | ICD-10-CM

## 2022-11-14 NOTE — Telephone Encounter (Signed)
Left message to call back  

## 2022-11-14 NOTE — Telephone Encounter (Signed)
New Message:      Patient said he had a CT Scoring Test last week. He said his results were really high. He is concerned, because he said he had drink 2 to 3 cups of coffee. He said he was not instructed by anyone that he should not drink coffee. He wants to know if Dr Elease Hashimoto thinks that drinking the coffee could have effected his results.? If so, does he need to take the test again?

## 2022-11-14 NOTE — Telephone Encounter (Signed)
Patient returned call, transferred to triage. Discussed calcium score CT results.  Per Dr. Elease Hashimoto: Coronary calcium score of 1994. This was 89th percentile for age and sex matched control   His last LDL is 113. His goal is < 70   Please start Rosuvastatin 20 mg a day Recheck lipids, ALT , BMP in 3 months I'll see him in ~4 months for follow up   Patient is hesitant about starting a statin medication and wishes to try a more holistic approach for cholesterol management first. He would like to have labs rechecked in 3 months and will follow-up with Dr. Elease Hashimoto in 4 months. Lipid, ALT, BMP ordered and lab appt scheduled for 02/14/23. F/U appt with Dr. Elease Hashimoto scheduled for 03/18/23.  Patient expressed concern with calcium score numbers. He is concerned about potential narrowing of coronary arteries that could lead to MI and asked if there were any tests he could have done to look at how much narrowing there is, if any.  Patient denies any CP, does report occasional SOB with exertion.  Will forward to Dr. Elease Hashimoto to review and advise.

## 2022-11-15 DIAGNOSIS — M48062 Spinal stenosis, lumbar region with neurogenic claudication: Secondary | ICD-10-CM | POA: Diagnosis not present

## 2022-11-15 NOTE — Telephone Encounter (Signed)
Completed in separate encounter

## 2022-11-19 DIAGNOSIS — M9903 Segmental and somatic dysfunction of lumbar region: Secondary | ICD-10-CM | POA: Diagnosis not present

## 2022-11-19 DIAGNOSIS — M9902 Segmental and somatic dysfunction of thoracic region: Secondary | ICD-10-CM | POA: Diagnosis not present

## 2022-11-19 DIAGNOSIS — M9901 Segmental and somatic dysfunction of cervical region: Secondary | ICD-10-CM | POA: Diagnosis not present

## 2022-11-19 DIAGNOSIS — M9905 Segmental and somatic dysfunction of pelvic region: Secondary | ICD-10-CM | POA: Diagnosis not present

## 2022-11-22 ENCOUNTER — Ambulatory Visit: Payer: No Typology Code available for payment source | Admitting: Cardiovascular Disease

## 2022-11-25 ENCOUNTER — Telehealth: Payer: Self-pay | Admitting: Cardiovascular Disease

## 2022-11-25 DIAGNOSIS — Z8249 Family history of ischemic heart disease and other diseases of the circulatory system: Secondary | ICD-10-CM | POA: Diagnosis not present

## 2022-11-25 DIAGNOSIS — E063 Autoimmune thyroiditis: Secondary | ICD-10-CM | POA: Diagnosis not present

## 2022-11-25 DIAGNOSIS — Z0001 Encounter for general adult medical examination with abnormal findings: Secondary | ICD-10-CM | POA: Diagnosis not present

## 2022-11-25 DIAGNOSIS — R931 Abnormal findings on diagnostic imaging of heart and coronary circulation: Secondary | ICD-10-CM

## 2022-11-25 NOTE — Telephone Encounter (Signed)
Pharmacy please advise on holding Eliquis prior to Lumbar ESI scheduled for TBD. Thank you.

## 2022-11-25 NOTE — Telephone Encounter (Signed)
   Pre-operative Risk Assessment    Patient Name: Kyle [name]  DOB: 02/17/1946 MRN: [id number]       Request for Surgical Clearance    Procedure:   Lumber IL ESI  Date of Surgery:  Clearance TBD                                 Surgeon:  Dr. Bettye Boeck Group or Practice Name:  [facility] Phone number:  [phone] Fax number:  [phone]   Type of Clearance Requested:   - Medical  - Pharmacy:  Hold Apixaban (Eliquis)     Type of Anesthesia:  Not Indicated   Additional requests/questions:    [name]   11/25/2022, 4:44 PM

## 2022-11-26 ENCOUNTER — Telehealth: Payer: Self-pay

## 2022-11-26 DIAGNOSIS — R931 Abnormal findings on diagnostic imaging of heart and coronary circulation: Secondary | ICD-10-CM | POA: Insufficient documentation

## 2022-11-26 DIAGNOSIS — G4733 Obstructive sleep apnea (adult) (pediatric): Secondary | ICD-10-CM | POA: Diagnosis not present

## 2022-11-26 DIAGNOSIS — I483 Typical atrial flutter: Secondary | ICD-10-CM | POA: Diagnosis not present

## 2022-11-26 NOTE — Telephone Encounter (Signed)
   Name: Kyle [name]  DOB: March 14, 1945  MRN: [id number]  Primary Cardiologist: Kristeen Miss, MD   Preoperative team, please contact this patient and set up a phone call appointment for further preoperative risk assessment. Please obtain consent and complete medication review. Thank you for your help.  I confirm that guidance regarding antiplatelet and oral anticoagulation therapy has been completed and, if necessary, noted below.  Per office protocol, patient can hold Eliquis for 3 days prior to procedure.   I also confirmed the patient resides in the state of [place]. As per [facility] Medical Board telemedicine laws, the patient must reside in the state in which the provider is licensed.   Napoleon Form, Leodis Rains, NP 11/26/2022, 8:05 AM [facility]

## 2022-11-26 NOTE — Telephone Encounter (Signed)
Patient with diagnosis of aflutter on Eliquis for anticoagulation.    Procedure: lumbar ESI Date of procedure: TBD  CHA2DS2-VASc Score = 3  This indicates a 3.2% annual risk of stroke. The patient's score is based upon: CHF History: 0 HTN History: 0 Diabetes History: 0 Stroke History: 0 Vascular Disease History: 1 Age Score: 2 Gender Score: 0   Calcium score of 1,994 10/2022 indicative of CAD, PMH updated.  DCCV 09/06/22, aflutter ablation 08/05/22.  CrCl 66mL/min using adjusted body weight Platelet count 257K  Per office protocol, patient can hold Eliquis for 3 days prior to procedure.    **This guidance is not considered finalized until pre-operative APP has relayed final recommendations.**

## 2022-11-26 NOTE — Telephone Encounter (Signed)
  Patient Consent for Virtual Visit        DOMINIQ [name] has provided verbal consent on 11/26/2022 for a virtual visit (video or telephone).   CONSENT FOR VIRTUAL VISIT FOR:  Kyle [name]  By participating in this virtual visit I agree to the following:  I hereby voluntarily request, consent and authorize [facility] and its employed or contracted physicians, physician assistants, nurse practitioners or other licensed health care professionals (the Practitioner), to provide me with telemedicine health care services (the "Services") as deemed necessary by the treating Practitioner. I acknowledge and consent to receive the Services by the Practitioner via telemedicine. I understand that the telemedicine visit will involve communicating with the Practitioner through live audiovisual communication technology and the disclosure of certain medical information by electronic transmission. I acknowledge that I have been given the opportunity to request an in-person assessment or other available alternative prior to the telemedicine visit and am voluntarily participating in the telemedicine visit.  I understand that I have the right to withhold or withdraw my consent to the use of telemedicine in the course of my care at any time, without affecting my right to future care or treatment, and that the Practitioner or I may terminate the telemedicine visit at any time. I understand that I have the right to inspect all information obtained and/or recorded in the course of the telemedicine visit and may receive copies of available information for a reasonable fee.  I understand that some of the potential risks of receiving the Services via telemedicine include:  Delay or interruption in medical evaluation due to technological equipment failure or disruption; Information transmitted may not be sufficient (e.g. poor resolution of images) to allow for appropriate medical decision making by the Practitioner;  and/or  In rare instances, security protocols could fail, causing a breach of personal health information.  Furthermore, I acknowledge that it is my responsibility to provide information about my medical history, conditions and care that is complete and accurate to the best of my ability. I acknowledge that Practitioner's advice, recommendations, and/or decision may be based on factors not within their control, such as incomplete or inaccurate data provided by me or distortions of diagnostic images or specimens that may result from electronic transmissions. I understand that the practice of medicine is not an exact science and that Practitioner makes no warranties or guarantees regarding treatment outcomes. I acknowledge that a copy of this consent can be made available to me via my patient portal [facility] MyChart), or I can request a printed copy by calling the office of [facility].    I understand that my insurance will be billed for this visit.   I have read or had this consent read to me. I understand the contents of this consent, which adequately explains the benefits and risks of the Services being provided via telemedicine.  I have been provided ample opportunity to ask questions regarding this consent and the Services and have had my questions answered to my satisfaction. I give my informed consent for the services to be provided through the use of telemedicine in my medical care

## 2022-11-26 NOTE — Telephone Encounter (Signed)
Patient agreeable with virtual appt; med list updated and consent given.

## 2022-12-03 DIAGNOSIS — M9903 Segmental and somatic dysfunction of lumbar region: Secondary | ICD-10-CM | POA: Diagnosis not present

## 2022-12-03 DIAGNOSIS — M9902 Segmental and somatic dysfunction of thoracic region: Secondary | ICD-10-CM | POA: Diagnosis not present

## 2022-12-03 DIAGNOSIS — M9905 Segmental and somatic dysfunction of pelvic region: Secondary | ICD-10-CM | POA: Diagnosis not present

## 2022-12-03 DIAGNOSIS — M9901 Segmental and somatic dysfunction of cervical region: Secondary | ICD-10-CM | POA: Diagnosis not present

## 2022-12-04 DIAGNOSIS — E785 Hyperlipidemia, unspecified: Secondary | ICD-10-CM | POA: Diagnosis not present

## 2022-12-04 DIAGNOSIS — Z6828 Body mass index (BMI) 28.0-28.9, adult: Secondary | ICD-10-CM | POA: Diagnosis not present

## 2022-12-04 DIAGNOSIS — M545 Low back pain, unspecified: Secondary | ICD-10-CM | POA: Diagnosis not present

## 2022-12-04 DIAGNOSIS — E663 Overweight: Secondary | ICD-10-CM | POA: Diagnosis not present

## 2022-12-04 DIAGNOSIS — Z008 Encounter for other general examination: Secondary | ICD-10-CM | POA: Diagnosis not present

## 2022-12-04 DIAGNOSIS — I2510 Atherosclerotic heart disease of native coronary artery without angina pectoris: Secondary | ICD-10-CM | POA: Diagnosis not present

## 2022-12-04 DIAGNOSIS — D6869 Other thrombophilia: Secondary | ICD-10-CM | POA: Diagnosis not present

## 2022-12-04 DIAGNOSIS — R7303 Prediabetes: Secondary | ICD-10-CM | POA: Diagnosis not present

## 2022-12-04 DIAGNOSIS — I4891 Unspecified atrial fibrillation: Secondary | ICD-10-CM | POA: Diagnosis not present

## 2022-12-06 ENCOUNTER — Ambulatory Visit: Payer: No Typology Code available for payment source

## 2022-12-06 NOTE — Telephone Encounter (Addendum)
Returned pt called. Patient states that as of now he does not have plans on rescheduling epidural. Patient also wanted to let us know  that he is no longer taking Eliquis and is using a supplement called Lumbrokinase instead. Patient also wanted to let us know that he is no longer taking Atorvastatin. Pt has appointment to see Dr. Nelly Laurence on 12/13/22

## 2022-12-06 NOTE — Telephone Encounter (Signed)
Pt is requesting a callback regarding him wanting to cancel his Telephone PreOp Clearance visit today at 2:20pm. Pt's appt has been marked as arrived so it can't be canceled but he did want to let the office know that he will not be available today. He stated it was for him to get an epidural but he's holding off on having it done for right now. Please advise

## 2022-12-13 ENCOUNTER — Ambulatory Visit
Payer: No Typology Code available for payment source | Attending: Cardiovascular Disease | Admitting: Cardiovascular Disease

## 2022-12-13 ENCOUNTER — Encounter: Payer: Self-pay | Admitting: Cardiovascular Disease

## 2022-12-13 VITALS — BP 148/80 | HR 82 | Ht 68.0 in | Wt 196.0 lb

## 2022-12-13 DIAGNOSIS — I484 Atypical atrial flutter: Secondary | ICD-10-CM

## 2022-12-13 NOTE — Patient Instructions (Addendum)
Medication Instructions:  Your physician recommends that you continue on your current medications as directed. Please refer to the Current Medication list given to you today. *If you need a refill on your cardiac medications before your next appointment, please call your pharmacy*   Follow-Up: At [facility], you and your health needs are our priority.  As part of our continuing mission to provide you with exceptional heart care, we have created designated Provider Care Teams.  These Care Teams include your primary Cardiologist (physician) and Advanced Practice Providers (APPs -  Physician Assistants and Nurse Practitioners) who all work together to provide you with the care you need, when you need it.  We recommend signing up for the patient portal called "MyChart".  Sign up information is provided on this After Visit Summary.  MyChart is used to connect with patients for Virtual Visits (Telemedicine).  Patients are able to view lab/test results, encounter notes, upcoming appointments, etc.  Non-urgent messages can be sent to your provider as well.   To learn more about what you can do with MyChart, go to [email or web address].    Your next appointment:   6 month(s)  Provider:   Augustus Mealor, MD  

## 2022-12-13 NOTE — Progress Notes (Signed)
Electrophysiology Office Note:    Date:  12/13/2022   ID:  TAHJI FILIPOVICH, DOB 28-Nov-1945, MRN [id number]  PCP:  Hannah Beat, MD   [facility] Providers Cardiologist:  Kristeen Miss, MD Electrophysiologist:  Maurice Small, MD     Referring MD: Hannah Beat, MD   History of Present Illness:    Kyle [name] is a 77 y.o. male with a hx listed below, significant for atrial tachycardia, referred for arrhythmia management.  Patient originally saw Dr. Elease Hashimoto in March due to atrial flutter.  He was referred for cardioversion, which was successful.  Echocardiogram showed normal LV function.  He was diagnosed with sleep apnea and started CPAP.  He is referred today by Dr. Lynnette Caffey when he returned with shortness of breath and chest discomfort.  He was noted to be in recurrent atrial arrhythmia.  He was taken to the EP lab on August 05, 2022 for EP study and mapping of arrhythmia.  Although his EKG at times had the appearance of typical atrial flutter, we determined that he had an atrial tachycardia arising from the right pulmonary vein.  I isolated the right superior pulmonary vein.  During the ablation, the patient converted to atrial fibrillation, which we had not appreciated previously.    Current Medications:    Allergies:   Codeine   Social and Family History: Reviewed in Epic  ROS:   Please see the history of present illness.    All other systems reviewed and are negative.  EKGs/Labs/Other Studies Reviewed Today:    Echocardiogram:  TTE 05/14/22 EF 60-65%, moderately dilated LA   Monitors:   Stress testing:  Ordered   Advanced imaging:   Cardiac catherization    EKG Interpretation Date/Time:  Friday December 13 2022 10:24:38 EDT Ventricular Rate:  82 PR Interval:  194 QRS Duration:  88 QT Interval:  378 QTC Calculation: 441 R Axis:   -16  Text Interpretation: Normal sinus rhythm Normal ECG When compared with ECG of 26-Sep-2022  09:58, No significant change was found Confirmed by York Pellant [phone]) on 12/13/2022 10:47:29 AM     Recent Labs: 04/09/2022: ALT 20 05/06/2022: TSH 3.160 09/02/2022: BUN 15; Creatinine, Ser 0.93; Hemoglobin 12.9; Platelets 257; Potassium 4.2; Sodium 136     Physical Exam:    VS:  BP (!) 148/80   Pulse 82   Ht 5\' 8"  (1.727 m)   Wt 196 lb (88.9 kg)   SpO2 98%   BMI 29.80 kg/m     Wt Readings from Last 3 Encounters:  12/13/22 196 lb (88.9 kg)  10/30/22 193 lb (87.5 kg)  10/16/22 193 lb 6.4 oz (87.7 kg)     GEN: Well nourished, well developed in no acute distress CARDIAC: RRR, no murmurs, rubs, gallops RESPIRATORY:  Normal work of breathing MUSCULOSKELETAL: no edema    ASSESSMENT & PLAN:    Atrial tachycardia Had the appearance of atrial flutter on EKG, but in retrospect there is an isoelectric segment between P waves The rhythm mapped to the right superior pulmonary vein Mildly symptomatic with shortness of breath He is status post ablation of the right superior pulmonary vein He did have a recurrence within a month of his ablation but has not had any since to his knowledge  Secondary hypercoagulable state As far as I am concerned, since this was an atrial tachycardia, there is no indication to continue Eliquis at this time         Medication Adjustments/Labs and Tests Ordered:  Current medicines are reviewed at length with the patient today.  Concerns regarding medicines are outlined above.  Orders Placed This Encounter  Procedures   EKG 12-Lead   No orders of the defined types were placed in this encounter.    Signed, Maurice Small, MD  12/13/2022 10:47 AM    [facility]

## 2022-12-16 ENCOUNTER — Telehealth: Payer: Self-pay

## 2022-12-16 NOTE — Telephone Encounter (Signed)
Spoke with patient. Left glasses after last office visit, in envelope and placed up at front desk for patient to pick up.

## 2022-12-17 DIAGNOSIS — M9905 Segmental and somatic dysfunction of pelvic region: Secondary | ICD-10-CM | POA: Diagnosis not present

## 2022-12-17 DIAGNOSIS — M9903 Segmental and somatic dysfunction of lumbar region: Secondary | ICD-10-CM | POA: Diagnosis not present

## 2022-12-17 DIAGNOSIS — M9901 Segmental and somatic dysfunction of cervical region: Secondary | ICD-10-CM | POA: Diagnosis not present

## 2022-12-17 DIAGNOSIS — M9902 Segmental and somatic dysfunction of thoracic region: Secondary | ICD-10-CM | POA: Diagnosis not present

## 2022-12-27 DIAGNOSIS — I483 Typical atrial flutter: Secondary | ICD-10-CM | POA: Diagnosis not present

## 2022-12-27 DIAGNOSIS — G4733 Obstructive sleep apnea (adult) (pediatric): Secondary | ICD-10-CM | POA: Diagnosis not present

## 2022-12-31 DIAGNOSIS — M9902 Segmental and somatic dysfunction of thoracic region: Secondary | ICD-10-CM | POA: Diagnosis not present

## 2022-12-31 DIAGNOSIS — M9905 Segmental and somatic dysfunction of pelvic region: Secondary | ICD-10-CM | POA: Diagnosis not present

## 2022-12-31 DIAGNOSIS — M9903 Segmental and somatic dysfunction of lumbar region: Secondary | ICD-10-CM | POA: Diagnosis not present

## 2022-12-31 DIAGNOSIS — M9901 Segmental and somatic dysfunction of cervical region: Secondary | ICD-10-CM | POA: Diagnosis not present

## 2023-01-13 ENCOUNTER — Telehealth: Payer: Self-pay | Admitting: Family Medicine

## 2023-01-13 DIAGNOSIS — M9903 Segmental and somatic dysfunction of lumbar region: Secondary | ICD-10-CM | POA: Diagnosis not present

## 2023-01-13 DIAGNOSIS — M9901 Segmental and somatic dysfunction of cervical region: Secondary | ICD-10-CM | POA: Diagnosis not present

## 2023-01-13 DIAGNOSIS — M9905 Segmental and somatic dysfunction of pelvic region: Secondary | ICD-10-CM | POA: Diagnosis not present

## 2023-01-13 DIAGNOSIS — M9902 Segmental and somatic dysfunction of thoracic region: Secondary | ICD-10-CM | POA: Diagnosis not present

## 2023-01-13 NOTE — Telephone Encounter (Signed)
Patient came in and wanted to if Dr. Patsy Lager would take on [name] ([birth year]) he stated that he asked a while ago and wanted to know if anything changed. He stated that she was seen here in the past until her previous provider left and she would like to come back here. Please advise. Thank you!

## 2023-01-13 NOTE — Telephone Encounter (Signed)
LVM to relay Dr. Cyndie Chime message below.

## 2023-01-13 NOTE — Telephone Encounter (Signed)
I apologize, but I have not taken new patients in about 10 years.  For what it is worth, I think that Lorn Junes does a great job, but she could always schedule a new patient appointment here with someone taking new patients.

## 2023-01-26 DIAGNOSIS — G4733 Obstructive sleep apnea (adult) (pediatric): Secondary | ICD-10-CM | POA: Diagnosis not present

## 2023-01-26 DIAGNOSIS — I483 Typical atrial flutter: Secondary | ICD-10-CM | POA: Diagnosis not present

## 2023-01-28 DIAGNOSIS — M9903 Segmental and somatic dysfunction of lumbar region: Secondary | ICD-10-CM | POA: Diagnosis not present

## 2023-01-28 DIAGNOSIS — M9902 Segmental and somatic dysfunction of thoracic region: Secondary | ICD-10-CM | POA: Diagnosis not present

## 2023-01-28 DIAGNOSIS — M9901 Segmental and somatic dysfunction of cervical region: Secondary | ICD-10-CM | POA: Diagnosis not present

## 2023-01-28 DIAGNOSIS — M9905 Segmental and somatic dysfunction of pelvic region: Secondary | ICD-10-CM | POA: Diagnosis not present

## 2023-02-04 DIAGNOSIS — Z85828 Personal history of other malignant neoplasm of skin: Secondary | ICD-10-CM | POA: Diagnosis not present

## 2023-02-04 DIAGNOSIS — L570 Actinic keratosis: Secondary | ICD-10-CM | POA: Diagnosis not present

## 2023-02-04 DIAGNOSIS — D2372 Other benign neoplasm of skin of left lower limb, including hip: Secondary | ICD-10-CM | POA: Diagnosis not present

## 2023-02-04 DIAGNOSIS — L812 Freckles: Secondary | ICD-10-CM | POA: Diagnosis not present

## 2023-02-04 DIAGNOSIS — L820 Inflamed seborrheic keratosis: Secondary | ICD-10-CM | POA: Diagnosis not present

## 2023-02-04 DIAGNOSIS — L218 Other seborrheic dermatitis: Secondary | ICD-10-CM | POA: Diagnosis not present

## 2023-02-04 DIAGNOSIS — L821 Other seborrheic keratosis: Secondary | ICD-10-CM | POA: Diagnosis not present

## 2023-02-04 DIAGNOSIS — D1801 Hemangioma of skin and subcutaneous tissue: Secondary | ICD-10-CM | POA: Diagnosis not present

## 2023-02-14 ENCOUNTER — Other Ambulatory Visit: Payer: No Typology Code available for payment source

## 2023-02-14 DIAGNOSIS — E785 Hyperlipidemia, unspecified: Secondary | ICD-10-CM

## 2023-02-14 LAB — LIPID PANEL
Chol/HDL Ratio: 2.8 {ratio} (ref 0.0–5.0)
Cholesterol, Total: 207 mg/dL — ABNORMAL HIGH (ref 100–199)
HDL: 75 mg/dL (ref >39)
LDL Chol Calc (NIH): 121 mg/dL — ABNORMAL HIGH (ref 0–99)
Triglycerides: 63 mg/dL (ref 0–149)
VLDL Cholesterol Cal: 11 mg/dL (ref 5–40)

## 2023-02-14 LAB — BASIC METABOLIC PANEL
BUN/Creatinine Ratio: 20 (ref 10–24)
BUN: 17 mg/dL (ref 8–27)
CO2: 24 mmol/L (ref 20–29)
Calcium: 9.6 mg/dL (ref 8.6–10.2)
Chloride: 100 mmol/L (ref 96–106)
Creatinine, Ser: 0.86 mg/dL (ref 0.76–1.27)
Glucose: 91 mg/dL (ref 70–99)
Potassium: 4.9 mmol/L (ref 3.5–5.2)
Sodium: 139 mmol/L (ref 134–144)
eGFR: 90 mL/min/{1.73_m2} (ref >59)

## 2023-02-14 LAB — ALT: ALT: 22 [IU]/L (ref 0–44)

## 2023-02-17 DIAGNOSIS — M9901 Segmental and somatic dysfunction of cervical region: Secondary | ICD-10-CM | POA: Diagnosis not present

## 2023-02-17 DIAGNOSIS — M9903 Segmental and somatic dysfunction of lumbar region: Secondary | ICD-10-CM | POA: Diagnosis not present

## 2023-02-17 DIAGNOSIS — M9902 Segmental and somatic dysfunction of thoracic region: Secondary | ICD-10-CM | POA: Diagnosis not present

## 2023-02-17 DIAGNOSIS — M9905 Segmental and somatic dysfunction of pelvic region: Secondary | ICD-10-CM | POA: Diagnosis not present

## 2023-02-20 DIAGNOSIS — M545 Low back pain, unspecified: Secondary | ICD-10-CM | POA: Diagnosis not present

## 2023-02-26 DIAGNOSIS — G4733 Obstructive sleep apnea (adult) (pediatric): Secondary | ICD-10-CM | POA: Diagnosis not present

## 2023-02-26 DIAGNOSIS — I483 Typical atrial flutter: Secondary | ICD-10-CM | POA: Diagnosis not present

## 2023-03-04 DIAGNOSIS — M9905 Segmental and somatic dysfunction of pelvic region: Secondary | ICD-10-CM | POA: Diagnosis not present

## 2023-03-04 DIAGNOSIS — M9902 Segmental and somatic dysfunction of thoracic region: Secondary | ICD-10-CM | POA: Diagnosis not present

## 2023-03-04 DIAGNOSIS — M9903 Segmental and somatic dysfunction of lumbar region: Secondary | ICD-10-CM | POA: Diagnosis not present

## 2023-03-04 DIAGNOSIS — M9901 Segmental and somatic dysfunction of cervical region: Secondary | ICD-10-CM | POA: Diagnosis not present

## 2023-03-14 DIAGNOSIS — M9903 Segmental and somatic dysfunction of lumbar region: Secondary | ICD-10-CM | POA: Diagnosis not present

## 2023-03-14 DIAGNOSIS — M9902 Segmental and somatic dysfunction of thoracic region: Secondary | ICD-10-CM | POA: Diagnosis not present

## 2023-03-14 DIAGNOSIS — M9901 Segmental and somatic dysfunction of cervical region: Secondary | ICD-10-CM | POA: Diagnosis not present

## 2023-03-14 DIAGNOSIS — M9905 Segmental and somatic dysfunction of pelvic region: Secondary | ICD-10-CM | POA: Diagnosis not present

## 2023-03-15 ENCOUNTER — Encounter: Payer: Self-pay | Admitting: Cardiovascular Disease
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
## 2023-03-17 DIAGNOSIS — E063 Autoimmune thyroiditis: Secondary | ICD-10-CM | POA: Diagnosis not present

## 2023-03-17 DIAGNOSIS — Z0001 Encounter for general adult medical examination with abnormal findings: Secondary | ICD-10-CM | POA: Diagnosis not present

## 2023-03-17 DIAGNOSIS — Z8249 Family history of ischemic heart disease and other diseases of the circulatory system: Secondary | ICD-10-CM | POA: Diagnosis not present

## 2023-03-18 ENCOUNTER — Ambulatory Visit
Payer: No Typology Code available for payment source | Attending: Cardiovascular Disease | Admitting: Cardiovascular Disease

## 2023-03-18 ENCOUNTER — Encounter: Payer: Self-pay | Admitting: Cardiovascular Disease

## 2023-03-18 VITALS — BP 136/84 | HR 74 | Ht 68.0 in | Wt 194.6 lb

## 2023-03-18 DIAGNOSIS — E782 Mixed hyperlipidemia: Secondary | ICD-10-CM | POA: Diagnosis not present

## 2023-03-18 DIAGNOSIS — I484 Atypical atrial flutter: Secondary | ICD-10-CM | POA: Diagnosis not present

## 2023-03-18 NOTE — Patient Instructions (Signed)
Follow-Up: At [facility], you and your health needs are our priority.  As part of our continuing mission to provide you with exceptional heart care, we have created designated Provider Care Teams.  These Care Teams include your primary Cardiologist (physician) and Advanced Practice Providers (APPs -  Physician Assistants and Nurse Practitioners) who all work together to provide you with the care you need, when you need it.  Your next appointment:   6 month(s)  Provider:   Thurmon Fair, MD     1st Floor: - Lobby - Registration  - Pharmacy  - Lab - Cafe  2nd Floor: - PV Lab - Diagnostic Testing (echo, CT, nuclear med)  3rd Floor: - Vacant  4th Floor: - TCTS (cardiothoracic surgery) - AFib Clinic - Structural Heart Clinic - Vascular Surgery  - Vascular Ultrasound  5th Floor: - HeartCare Cardiology (general and EP) - Clinical Pharmacy for coumadin, hypertension, lipid, weight-loss medications, and med management appointments    Valet parking services will be available as well.

## 2023-03-26 ENCOUNTER — Telehealth: Payer: Self-pay | Admitting: *Deleted

## 2023-03-26 DIAGNOSIS — E782 Mixed hyperlipidemia: Secondary | ICD-10-CM

## 2023-03-26 DIAGNOSIS — Z79899 Other long term (current) drug therapy: Secondary | ICD-10-CM

## 2023-03-26 DIAGNOSIS — I483 Typical atrial flutter: Secondary | ICD-10-CM

## 2023-03-26 DIAGNOSIS — Z125 Encounter for screening for malignant neoplasm of prostate: Secondary | ICD-10-CM

## 2023-03-26 DIAGNOSIS — R739 Hyperglycemia, unspecified: Secondary | ICD-10-CM

## 2023-03-26 NOTE — Telephone Encounter (Signed)
-----   Message from Lovena Neighbours sent at 03/26/2023  2:58 PM EST ----- Regarding: Labs for Monday 2.17.25 Please put physical lab orders in future. Thank you, Denny Peon

## 2023-03-28 DIAGNOSIS — M9902 Segmental and somatic dysfunction of thoracic region: Secondary | ICD-10-CM | POA: Diagnosis not present

## 2023-03-28 DIAGNOSIS — M9903 Segmental and somatic dysfunction of lumbar region: Secondary | ICD-10-CM | POA: Diagnosis not present

## 2023-03-28 DIAGNOSIS — M9905 Segmental and somatic dysfunction of pelvic region: Secondary | ICD-10-CM | POA: Diagnosis not present

## 2023-03-28 DIAGNOSIS — M9901 Segmental and somatic dysfunction of cervical region: Secondary | ICD-10-CM | POA: Diagnosis not present

## 2023-03-29 DIAGNOSIS — G4733 Obstructive sleep apnea (adult) (pediatric): Secondary | ICD-10-CM | POA: Diagnosis not present

## 2023-03-29 DIAGNOSIS — I483 Typical atrial flutter: Secondary | ICD-10-CM | POA: Diagnosis not present

## 2023-04-03 ENCOUNTER — Ambulatory Visit: Payer: No Typology Code available for payment source

## 2023-04-03 VITALS — BP 126/68 | Ht 68.0 in | Wt 196.0 lb

## 2023-04-03 DIAGNOSIS — Z0000 Encounter for general adult medical examination without abnormal findings: Secondary | ICD-10-CM | POA: Diagnosis not present
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
## 2023-04-03 NOTE — Patient Instructions (Signed)
 Kyle [name] , Thank you for taking time to come for your Medicare Wellness Visit. I appreciate your ongoing commitment to your health goals. Please review the following plan we discussed and let me know if I can assist you in the future.   Referrals/Orders/Follow-Ups/Clinician Recommendations: none  This is a list of the screening recommended for you and due dates:  Health Maintenance  Topic Date Due   Flu Shot  09/26/2022   COVID-19 Vaccine (7 - 2024-25 season) 10/27/2022   DTaP/Tdap/Td vaccine (3 - Td or Tdap) 02/10/2024   Medicare Annual Wellness Visit  04/02/2024   Pneumonia Vaccine  Completed   Hepatitis C Screening  Completed   Zoster (Shingles) Vaccine  Completed   HPV Vaccine  Aged Out   Colon Cancer Screening  Discontinued    Advanced directives: (In Chart) A copy of your advanced directives are scanned into your chart should your provider ever need it.  Next Medicare Annual Wellness Visit scheduled for next year: Yes 04/05/2024 @ 3pm in person

## 2023-04-11 DIAGNOSIS — M9905 Segmental and somatic dysfunction of pelvic region: Secondary | ICD-10-CM | POA: Diagnosis not present

## 2023-04-11 DIAGNOSIS — M9902 Segmental and somatic dysfunction of thoracic region: Secondary | ICD-10-CM | POA: Diagnosis not present

## 2023-04-11 DIAGNOSIS — M9901 Segmental and somatic dysfunction of cervical region: Secondary | ICD-10-CM | POA: Diagnosis not present

## 2023-04-11 DIAGNOSIS — M9903 Segmental and somatic dysfunction of lumbar region: Secondary | ICD-10-CM | POA: Diagnosis not present

## 2023-04-14 ENCOUNTER — Other Ambulatory Visit (INDEPENDENT_AMBULATORY_CARE_PROVIDER_SITE_OTHER): Payer: No Typology Code available for payment source

## 2023-04-14 DIAGNOSIS — I483 Typical atrial flutter: Secondary | ICD-10-CM | POA: Diagnosis not present

## 2023-04-14 DIAGNOSIS — E782 Mixed hyperlipidemia: Secondary | ICD-10-CM | POA: Diagnosis not present

## 2023-04-14 DIAGNOSIS — Z125 Encounter for screening for malignant neoplasm of prostate: Secondary | ICD-10-CM

## 2023-04-14 DIAGNOSIS — Z79899 Other long term (current) drug therapy: Secondary | ICD-10-CM

## 2023-04-14 DIAGNOSIS — R739 Hyperglycemia, unspecified: Secondary | ICD-10-CM

## 2023-04-14 LAB — LIPID PANEL
Cholesterol: 182 mg/dL (ref 0–200)
HDL: 65.3 mg/dL (ref >39.00)
LDL Cholesterol: 102 mg/dL — ABNORMAL HIGH (ref 0–99)
NonHDL: 116.31
Total CHOL/HDL Ratio: 3
Triglycerides: 70 mg/dL (ref 0.0–149.0)
VLDL: 14 mg/dL (ref 0.0–40.0)

## 2023-04-14 LAB — CBC WITH DIFFERENTIAL/PLATELET
Basophils Absolute: 0.1 10*3/uL (ref 0.0–0.1)
Basophils Relative: 1.2 % (ref 0.0–3.0)
Eosinophils Absolute: 0.6 10*3/uL (ref 0.0–0.7)
Eosinophils Relative: 6 % — ABNORMAL HIGH (ref 0.0–5.0)
HCT: 39.8 % (ref 39.0–52.0)
Hemoglobin: 13.6 g/dL (ref 13.0–17.0)
Lymphocytes Relative: 32.3 % (ref 12.0–46.0)
Lymphs Abs: 3.2 10*3/uL (ref 0.7–4.0)
MCHC: 34.2 g/dL (ref 30.0–36.0)
MCV: 94.1 fL (ref 78.0–100.0)
Monocytes Absolute: 0.8 10*3/uL (ref 0.1–1.0)
Monocytes Relative: 8.5 % (ref 3.0–12.0)
Neutro Abs: 5.1 10*3/uL (ref 1.4–7.7)
Neutrophils Relative %: 52 % (ref 43.0–77.0)
Platelets: 317 10*3/uL (ref 150.0–400.0)
RBC: 4.23 Mil/uL (ref 4.22–5.81)
RDW: 13.1 % (ref 11.5–15.5)
WBC: 9.8 10*3/uL (ref 4.0–10.5)

## 2023-04-14 LAB — BASIC METABOLIC PANEL
BUN: 15 mg/dL (ref 6–23)
CO2: 29 meq/L (ref 19–32)
Calcium: 9.1 mg/dL (ref 8.4–10.5)
Chloride: 98 meq/L (ref 96–112)
Creatinine, Ser: 0.77 mg/dL (ref 0.40–1.50)
GFR: 87 mL/min (ref >60.00)
Glucose, Bld: 95 mg/dL (ref 70–99)
Potassium: 4.3 meq/L (ref 3.5–5.1)
Sodium: 134 meq/L — ABNORMAL LOW (ref 135–145)

## 2023-04-14 LAB — HEPATIC FUNCTION PANEL
ALT: 22 U/L (ref 0–53)
AST: 26 U/L (ref 0–37)
Albumin: 4.1 g/dL (ref 3.5–5.2)
Alkaline Phosphatase: 72 U/L (ref 39–117)
Bilirubin, Direct: 0.1 mg/dL (ref 0.0–0.3)
Total Bilirubin: 0.7 mg/dL (ref 0.2–1.2)
Total Protein: 7.5 g/dL (ref 6.0–8.3)

## 2023-04-14 LAB — TSH: TSH: 4.15 u[IU]/mL (ref 0.35–5.50)

## 2023-04-14 LAB — HEMOGLOBIN A1C: Hgb A1c MFr Bld: 5.6 % (ref 4.6–6.5)

## 2023-04-14 LAB — PSA, MEDICARE: PSA: 0.96 ng/mL (ref 0.10–4.00)
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
## 2023-04-21 ENCOUNTER — Encounter: Payer: Self-pay | Admitting: Family Medicine

## 2023-04-21 ENCOUNTER — Ambulatory Visit (INDEPENDENT_AMBULATORY_CARE_PROVIDER_SITE_OTHER): Payer: No Typology Code available for payment source | Admitting: Family Medicine

## 2023-04-21 ENCOUNTER — Other Ambulatory Visit: Payer: Self-pay | Admitting: Family Medicine

## 2023-04-21 VITALS — BP 130/64 | HR 71 | Temp 98.4°F | Ht 68.0 in | Wt 194.5 lb

## 2023-04-21 DIAGNOSIS — Z0000 Encounter for general adult medical examination without abnormal findings: Secondary | ICD-10-CM

## 2023-04-21 DIAGNOSIS — R931 Abnormal findings on diagnostic imaging of heart and coronary circulation: Secondary | ICD-10-CM

## 2023-04-21 MED ORDER — SILDENAFIL CITRATE 100 MG PO TABS
50.0000 mg | ORAL_TABLET | Freq: Every day | ORAL | 11 refills | Status: AC | PRN
Start: 2023-04-21 — End: ?

## 2023-04-21 NOTE — Patient Instructions (Signed)
 Cholesterol:  Red Yeast Rice supplement

## 2023-04-26 DIAGNOSIS — I483 Typical atrial flutter: Secondary | ICD-10-CM | POA: Diagnosis not present

## 2023-04-26 DIAGNOSIS — G4733 Obstructive sleep apnea (adult) (pediatric): Secondary | ICD-10-CM | POA: Diagnosis not present

## 2023-05-02 DIAGNOSIS — M9903 Segmental and somatic dysfunction of lumbar region: Secondary | ICD-10-CM | POA: Diagnosis not present

## 2023-05-02 DIAGNOSIS — M9905 Segmental and somatic dysfunction of pelvic region: Secondary | ICD-10-CM | POA: Diagnosis not present

## 2023-05-02 DIAGNOSIS — M9902 Segmental and somatic dysfunction of thoracic region: Secondary | ICD-10-CM | POA: Diagnosis not present

## 2023-05-02 DIAGNOSIS — M9901 Segmental and somatic dysfunction of cervical region: Secondary | ICD-10-CM | POA: Diagnosis not present

## 2023-05-12 DIAGNOSIS — G4733 Obstructive sleep apnea (adult) (pediatric): Secondary | ICD-10-CM | POA: Diagnosis not present

## 2023-05-23 ENCOUNTER — Ambulatory Visit: Payer: No Typology Code available for payment source | Attending: Internal Medicine | Admitting: Cardiovascular Disease

## 2023-05-23 VITALS — BP 132/86 | HR 73 | Resp 16 | Wt 193.4 lb

## 2023-05-23 DIAGNOSIS — I484 Atypical atrial flutter: Secondary | ICD-10-CM | POA: Diagnosis not present

## 2023-05-23 DIAGNOSIS — M9905 Segmental and somatic dysfunction of pelvic region: Secondary | ICD-10-CM | POA: Diagnosis not present

## 2023-05-23 DIAGNOSIS — M9901 Segmental and somatic dysfunction of cervical region: Secondary | ICD-10-CM | POA: Diagnosis not present

## 2023-05-23 DIAGNOSIS — M9902 Segmental and somatic dysfunction of thoracic region: Secondary | ICD-10-CM | POA: Diagnosis not present

## 2023-05-23 DIAGNOSIS — M9903 Segmental and somatic dysfunction of lumbar region: Secondary | ICD-10-CM | POA: Diagnosis not present

## 2023-05-23 NOTE — Progress Notes (Signed)
 Electrophysiology Office Note:    Date:  05/23/2023   ID:  WHIT BRUNI, DOB [birth year], MRN [id number]  PCP:  Hannah Beat, MD   [facility] Providers Cardiologist:  Kristeen Miss, MD Electrophysiologist:  Maurice Small, MD     Referring MD: Hannah Beat, MD   History of Present Illness:    Kyle [name] is a 78 y.o. male with a hx listed below, significant for atrial tachycardia, referred for arrhythmia management.  Patient originally saw Dr. Elease Hashimoto in March due to atrial flutter.  He was referred for cardioversion, which was successful.  Echocardiogram showed normal LV function.  He was diagnosed with sleep apnea and started CPAP.  He is referred today by Dr. Lynnette Caffey when he returned with shortness of breath and chest discomfort.  He was noted to be in recurrent atrial arrhythmia.  He was taken to the EP lab on August 05, 2022 for EP study and mapping of arrhythmia.  Although his EKG at times had the appearance of typical atrial flutter, we determined that he had an atrial tachycardia arising from the right pulmonary vein.  I isolated the right superior pulmonary vein.  During the ablation, the patient converted to atrial fibrillation, which we had not appreciated previously.  He had recurrence of the arrhythmia about a month following the procedure and underwent DCCV with return to sinus rhythm.   Current Medications:    Allergies:   Codeine   Social and Family History: Reviewed in Epic  ROS:   Please see the history of present illness.    All other systems reviewed and are negative.  EKGs/Labs/Other Studies Reviewed Today:    Echocardiogram:  TTE 05/14/22 EF 60-65%, moderately dilated LA   Monitors:   Stress testing:  07/12/2022 --no reversible ischemia.  Normal ejection fraction   Advanced imaging:  CT cardiac scoring September 2024.  89th percentile for age and sex matched control.  Cardiac catherization          Recent  Labs: 04/14/2023: ALT 22; BUN 15; Creatinine, Ser 0.77; Hemoglobin 13.6; Platelets 317.0; Potassium 4.3; Sodium 134; TSH 4.15     Physical Exam:    VS:  BP 132/86 (BP Location: Left Arm, Patient Position: Sitting)   Pulse 73   Resp 16   Wt 193 lb 6.4 oz (87.7 kg)   SpO2 99%   BMI 29.41 kg/m     Wt Readings from Last 3 Encounters:  05/23/23 193 lb 6.4 oz (87.7 kg)  04/21/23 194 lb 8 oz (88.2 kg)  04/03/23 196 lb (88.9 kg)     GEN: Well nourished, well developed in no acute distress CARDIAC: RRR, no murmurs, rubs, gallops RESPIRATORY:  Normal work of breathing MUSCULOSKELETAL: no edema    ASSESSMENT & PLAN:    Atrial tachycardia Had the appearance of atrial flutter on EKG, but in retrospect there is an isoelectric segment between P waves The rhythm mapped to the right superior pulmonary vein, likely micro-reentry Mildly symptomatic with shortness of breath He is status post ablation of the right superior pulmonary vein He did have a recurrence within a month of his ablation but has not had any since to his knowledge Due to to recurrence of arrhythmia after ablation, inducible atrial fibrillation during the procedure, very mild symptoms I think it best to place a loop recorder for arrhythmia management and monitoring for atrial fibrillation  I explained the rationale for the loop recorder placement procedure.  I explained the monitoring process and  associated fee.  He would like to proceed.  Secondary hypercoagulable state As far as I am concerned, since this was an atrial tachycardia, there is no indication to continue Eliquis at this time         Medication Adjustments/Labs and Tests Ordered: Current medicines are reviewed at length with the patient today.  Concerns regarding medicines are outlined above.  Orders Placed This Encounter  Procedures   EKG 12-Lead   No orders of the defined types were placed in this encounter.    Signed, Maurice Small, MD   05/23/2023 10:34 AM    [facility]

## 2023-05-23 NOTE — Patient Instructions (Signed)
 Medication Instructions:  Your physician recommends that you continue on your current medications as directed. Please refer to the Current Medication list given to you today. *If you need a refill on your cardiac medications before your next appointment, please call your pharmacy*   Testing/Procedures: Implantable Loop Recorder - someone will be in contact to schedule this for you Your physician has recommended that you have a pacemaker inserted. A pacemaker is a small device that is placed under the skin of your chest or abdomen to help control abnormal heart rhythms. This device uses electrical pulses to prompt the heart to beat at a normal rate. Pacemakers are used to treat heart rhythms that are too slow. Wires (leads) are attached to the pacemaker that goes into the chambers of your heart. This is done in the hospital and usually requires an overnight stay. Please see the instruction sheet given to you today for more information.   Follow-Up: At [facility], you and your health needs are our priority.  As part of our continuing mission to provide you with exceptional heart care, our providers are all part of one team.  This team includes your primary Cardiologist (physician) and Advanced Practice Providers or APPs (Physician Assistants and Nurse Practitioners) who all work together to provide you with the care you need, when you need it.  Your next appointment:   We will contact you to set up your loop implant  Provider:   York Pellant, MD  We recommend signing up for the patient portal called "MyChart".  Sign up information is provided on this After Visit Summary.  MyChart is used to connect with patients for Virtual Visits (Telemedicine).  Patients are able to view lab/test results, encounter notes, upcoming appointments, etc.  Non-urgent messages can be sent to your provider as well.   To learn more about what you can do with MyChart, go to [email or web address].          1st Floor: - Lobby - Registration  - Pharmacy  - Lab - Cafe  2nd Floor: - PV Lab - Diagnostic Testing (echo, CT, nuclear med)  3rd Floor: - Vacant  4th Floor: - TCTS (cardiothoracic surgery) - AFib Clinic - Structural Heart Clinic - Vascular Surgery  - Vascular Ultrasound  5th Floor: - HeartCare Cardiology (general and EP) - Clinical Pharmacy for coumadin, hypertension, lipid, weight-loss medications, and med management appointments    Valet parking services will be available as well.

## 2023-05-26 DIAGNOSIS — E663 Overweight: Secondary | ICD-10-CM | POA: Diagnosis not present

## 2023-05-26 DIAGNOSIS — Z6828 Body mass index (BMI) 28.0-28.9, adult: Secondary | ICD-10-CM | POA: Diagnosis not present

## 2023-05-26 DIAGNOSIS — Z008 Encounter for other general examination: Secondary | ICD-10-CM | POA: Diagnosis not present

## 2023-05-27 DIAGNOSIS — I483 Typical atrial flutter: Secondary | ICD-10-CM | POA: Diagnosis not present

## 2023-05-27 DIAGNOSIS — G4733 Obstructive sleep apnea (adult) (pediatric): Secondary | ICD-10-CM | POA: Diagnosis not present

## 2023-06-13 DIAGNOSIS — M9901 Segmental and somatic dysfunction of cervical region: Secondary | ICD-10-CM | POA: Diagnosis not present

## 2023-06-13 DIAGNOSIS — M9905 Segmental and somatic dysfunction of pelvic region: Secondary | ICD-10-CM | POA: Diagnosis not present

## 2023-06-13 DIAGNOSIS — M9903 Segmental and somatic dysfunction of lumbar region: Secondary | ICD-10-CM | POA: Diagnosis not present

## 2023-06-13 DIAGNOSIS — M9902 Segmental and somatic dysfunction of thoracic region: Secondary | ICD-10-CM | POA: Diagnosis not present

## 2023-07-04 DIAGNOSIS — M9902 Segmental and somatic dysfunction of thoracic region: Secondary | ICD-10-CM | POA: Diagnosis not present

## 2023-07-04 DIAGNOSIS — M9903 Segmental and somatic dysfunction of lumbar region: Secondary | ICD-10-CM | POA: Diagnosis not present

## 2023-07-04 DIAGNOSIS — M9901 Segmental and somatic dysfunction of cervical region: Secondary | ICD-10-CM | POA: Diagnosis not present

## 2023-07-04 DIAGNOSIS — M9905 Segmental and somatic dysfunction of pelvic region: Secondary | ICD-10-CM | POA: Diagnosis not present

## 2023-07-09 NOTE — Progress Notes (Unsigned)
 Electrophysiology Office Note:    Date:  07/10/2023   ID:  Kyle [name], DOB 1945/04/20, MRN [id number]  PCP:  Kyle Curt, MD   [facility] Providers Cardiologist:  Kyle Alert, MD Electrophysiologist:  Kyle Grange, MD     Referring MD: Kyle Curt, MD   History of Present Illness:    Kyle [name] is a 78 y.o. male with a hx listed below, significant for atrial tachycardia, referred for arrhythmia management.  Patient originally saw Dr. Alroy [name] in March due to atrial flutter.  He was referred for cardioversion, which was successful.  Echocardiogram showed normal LV function.  He was diagnosed with sleep apnea and started CPAP.  He is referred today by Dr. Lorie [name] when he returned with shortness of breath and chest discomfort.  He was noted to be in recurrent atrial arrhythmia.  He was taken to the EP lab on August 05, 2022 for EP study and mapping of arrhythmia.  Although his EKG at times had the appearance of atrial flutter, we determined that he had an atrial tachycardia arising from the right pulmonary vein.  I isolated the right superior pulmonary vein.  During the ablation, the patient converted to atrial fibrillation, which we had not appreciated previously. And then to Sinus rhythm.   He had recurrence of the arrhythmia about a month following the procedure and underwent DCCV with return to sinus rhythm.  Today (07/10/2023), he notes that he had onset of shortness of breath and fatigue about 2 weeks ago.  EKG today shows rhythm similar to the prior tachycardia we addressed in June.  He presents today for loop recorder placement.    EKGs/Labs/Other Studies Reviewed Today:    Echocardiogram:  TTE 05/14/22 EF 60-65%, moderately dilated LA   Monitors:   Stress testing:  07/12/2022 --no reversible ischemia.  Normal ejection fraction   Advanced imaging:  CT cardiac scoring September 2024.  89th percentile for age and sex matched  control.  Cardiac catherization    EKG Interpretation Date/Time:  Thursday Jul 10 2023 08:25:52 EDT Ventricular Rate:  76 PR Interval:    QRS Duration:  86 QT Interval:  376 QTC Calculation: 423 R Axis:   5  Text Interpretation: Atrial flutter with variable A-V block When compared with ECG of 23-May-2023 10:18, Atrial flutter has replaced Sinus rhythm Confirmed by Kyle [name] [phone] on 07/10/2023 8:32:13 AM     Recent Labs: 04/14/2023: ALT 22; BUN 15; Creatinine, Ser 0.77; Hemoglobin 13.6; Platelets 317.0; Potassium 4.3; Sodium 134; TSH 4.15     Physical Exam:    VS:  BP 128/74 (BP Location: Left Arm, Patient Position: Sitting, Cuff Size: Large)   Pulse 76   Ht 5\' 8"  (1.727 m)   Wt 197 lb 3.2 oz (89.4 kg)   SpO2 97%   BMI 29.98 kg/m     Wt Readings from Last 3 Encounters:  07/10/23 197 lb 3.2 oz (89.4 kg)  05/23/23 193 lb 6.4 oz (87.7 kg)  04/21/23 194 lb 8 oz (88.2 kg)     GEN: Well nourished, well developed in no acute distress CARDIAC: RRR, no murmurs, rubs, gallops RESPIRATORY:  Normal work of breathing MUSCULOSKELETAL: no edema    ASSESSMENT & PLAN:    Pulmonary vein tachycardia and atrial fibrillation Had the appearance of atrial flutter on EKG, but mapped to a discreet focus in the RSPV The rhythm mapped to the right superior pulmonary vein -- there was patchy scar and heterogenous conduction in this  area. This may have been a micro re-entrant mechanism rather than an automatic focal tachycardia. Mildly symptomatic with shortness of breath He is status post ablation of the right superior pulmonary vein Due to to recurrence of arrhythmia after ablation, inducible atrial fibrillation during the procedure, very mild symptoms I think it best to place a loop recorder for arrhythmia management and monitoring for atrial fibrillation -- we will proceed with this today We also discussed management options for his recurrent arrhythmia.  Using a shared decision  making approach, we opted to return to the EP lab for additional mapping.  Anticipate we will need to isolate his pulmonary veins.  I explained the rationale for the loop recorder placement procedure.  I explained the monitoring process and associated fee.  He would like to proceed.  We discussed the indication, rationale, logistics, anticipated benefits, and potential risks of the ablation procedure including but not limited to -- bleed at the groin access site, chest pain, damage to nearby organs such as the diaphragm, lungs, or esophagus, need for a drainage tube, or prolonged hospitalization. I explained that the risk for stroke, heart attack, need for open chest surgery, or even death is very low but not zero. he  expressed understanding and wishes to proceed.   Secondary hypercoagulable state We will plan for cardioversion, which was successful in the past. Continue apixaban  prior to cardioversion and ablation I hope to DC apixaban  after his ablation and monitor for recurrence of atrial fibrillation with his loop recorder.         Medication Adjustments/Labs and Tests Ordered: Current medicines are reviewed at length with the patient today.  Concerns regarding medicines are outlined above.  Orders Placed This Encounter  Procedures   EKG 12-Lead   No orders of the defined types were placed in this encounter.    Signed, Kyle Grange, MD  07/10/2023 8:32 AM     [facility]  SURGEON:  Kyle Grange, MD     PREPROCEDURE DIAGNOSIS:  Atrial fibrillation    POSTPROCEDURE DIAGNOSIS: Atrial fibrillation     PROCEDURES:   1. Implantable loop recorder implantation    INTRODUCTION:  Kyle [name] presents with a history of atrial fibrillation The costs of loop recorder monitoring have been discussed with the patient.    DESCRIPTION OF PROCEDURE:  Informed written consent was obtained.  A timeout was performed. The patient required no sedation for the procedure  today. The patients left chest was prepped and draped in the usual sterile fashion. The skin overlying the left parasternal region was infiltrated with lidocaine  for local analgesia.  A 0.5-cm incision was made over the left parasternal region over the 3rd intercostal space.  A subcutaneous ILR pocket was fashioned using a combination of sharp and blunt dissection.  A [employer] Reveal LINQ 2  [id number]) implantable loop recorder was then placed into the pocket  R waves were very prominent and measured >0.87mV.  Steri- Strips and a sterile dressing were then applied.  There were no early apparent complications.     CONCLUSIONS:   1. Successful implantation of a implantable loop recorder for a history of recurrent tachycardia and atrial fibrillation  2. No early apparent complications.   Kyle Grange, MD  Cardiac Electrophysiology

## 2023-07-09 NOTE — H&P (View-Only) (Signed)
 Electrophysiology Office Note:    Date:  07/10/2023   ID:  Kyle [name], DOB 1945/04/20, MRN [id number]  PCP:  Scherrie Curt, MD   [facility] Providers Cardiologist:  Ahmad Alert, MD Electrophysiologist:  Efraim Grange, MD     Referring MD: Scherrie Curt, MD   History of Present Illness:    Kyle [name] is a 78 y.o. male with a hx listed below, significant for atrial tachycardia, referred for arrhythmia management.  Patient originally saw Dr. Alroy Aspen in March due to atrial flutter.  He was referred for cardioversion, which was successful.  Echocardiogram showed normal LV function.  He was diagnosed with sleep apnea and started CPAP.  He is referred today by Dr. Lorie Rook when he returned with shortness of breath and chest discomfort.  He was noted to be in recurrent atrial arrhythmia.  He was taken to the EP lab on August 05, 2022 for EP study and mapping of arrhythmia.  Although his EKG at times had the appearance of atrial flutter, we determined that he had an atrial tachycardia arising from the right pulmonary vein.  I isolated the right superior pulmonary vein.  During the ablation, the patient converted to atrial fibrillation, which we had not appreciated previously. And then to Sinus rhythm.   He had recurrence of the arrhythmia about a month following the procedure and underwent DCCV with return to sinus rhythm.  Today (07/10/2023), he notes that he had onset of shortness of breath and fatigue about 2 weeks ago.  EKG today shows rhythm similar to the prior tachycardia we addressed in June.  He presents today for loop recorder placement.    EKGs/Labs/Other Studies Reviewed Today:    Echocardiogram:  TTE 05/14/22 EF 60-65%, moderately dilated LA   Monitors:   Stress testing:  07/12/2022 --no reversible ischemia.  Normal ejection fraction   Advanced imaging:  CT cardiac scoring September 2024.  89th percentile for age and sex matched  control.  Cardiac catherization    EKG Interpretation Date/Time:  Thursday Jul 10 2023 08:25:52 EDT Ventricular Rate:  76 PR Interval:    QRS Duration:  86 QT Interval:  376 QTC Calculation: 423 R Axis:   5  Text Interpretation: Atrial flutter with variable A-V block When compared with ECG of 23-May-2023 10:18, Atrial flutter has replaced Sinus rhythm Confirmed by Marlane Silver [phone]) on 07/10/2023 8:32:13 AM     Recent Labs: 04/14/2023: ALT 22; BUN 15; Creatinine, Ser 0.77; Hemoglobin 13.6; Platelets 317.0; Potassium 4.3; Sodium 134; TSH 4.15     Physical Exam:    VS:  BP 128/74 (BP Location: Left Arm, Patient Position: Sitting, Cuff Size: Large)   Pulse 76   Ht 5\' 8"  (1.727 m)   Wt 197 lb 3.2 oz (89.4 kg)   SpO2 97%   BMI 29.98 kg/m     Wt Readings from Last 3 Encounters:  07/10/23 197 lb 3.2 oz (89.4 kg)  05/23/23 193 lb 6.4 oz (87.7 kg)  04/21/23 194 lb 8 oz (88.2 kg)     GEN: Well nourished, well developed in no acute distress CARDIAC: RRR, no murmurs, rubs, gallops RESPIRATORY:  Normal work of breathing MUSCULOSKELETAL: no edema    ASSESSMENT & PLAN:    Pulmonary vein tachycardia and atrial fibrillation Had the appearance of atrial flutter on EKG, but mapped to a discreet focus in the RSPV The rhythm mapped to the right superior pulmonary vein -- there was patchy scar and heterogenous conduction in this  area. This may have been a micro re-entrant mechanism rather than an automatic focal tachycardia. Mildly symptomatic with shortness of breath He is status post ablation of the right superior pulmonary vein Due to to recurrence of arrhythmia after ablation, inducible atrial fibrillation during the procedure, very mild symptoms I think it best to place a loop recorder for arrhythmia management and monitoring for atrial fibrillation -- we will proceed with this today We also discussed management options for his recurrent arrhythmia.  Using a shared decision  making approach, we opted to return to the EP lab for additional mapping.  Anticipate we will need to isolate his pulmonary veins.  I explained the rationale for the loop recorder placement procedure.  I explained the monitoring process and associated fee.  He would like to proceed.  We discussed the indication, rationale, logistics, anticipated benefits, and potential risks of the ablation procedure including but not limited to -- bleed at the groin access site, chest pain, damage to nearby organs such as the diaphragm, lungs, or esophagus, need for a drainage tube, or prolonged hospitalization. I explained that the risk for stroke, heart attack, need for open chest surgery, or even death is very low but not zero. he  expressed understanding and wishes to proceed.   Secondary hypercoagulable state We will plan for cardioversion, which was successful in the past. Continue apixaban  prior to cardioversion and ablation I hope to DC apixaban  after his ablation and monitor for recurrence of atrial fibrillation with his loop recorder.         Medication Adjustments/Labs and Tests Ordered: Current medicines are reviewed at length with the patient today.  Concerns regarding medicines are outlined above.  Orders Placed This Encounter  Procedures   EKG 12-Lead   No orders of the defined types were placed in this encounter.    Signed, Efraim Grange, MD  07/10/2023 8:32 AM     [facility]  SURGEON:  Efraim Grange, MD     PREPROCEDURE DIAGNOSIS:  Atrial fibrillation    POSTPROCEDURE DIAGNOSIS: Atrial fibrillation     PROCEDURES:   1. Implantable loop recorder implantation    INTRODUCTION:  HRIDHAAN KEIR presents with a history of atrial fibrillation The costs of loop recorder monitoring have been discussed with the patient.    DESCRIPTION OF PROCEDURE:  Informed written consent was obtained.  A timeout was performed. The patient required no sedation for the procedure  today. The patients left chest was prepped and draped in the usual sterile fashion. The skin overlying the left parasternal region was infiltrated with lidocaine  for local analgesia.  A 0.5-cm incision was made over the left parasternal region over the 3rd intercostal space.  A subcutaneous ILR pocket was fashioned using a combination of sharp and blunt dissection.  A Medtronic Reveal LINQ 2  [id number]) implantable loop recorder was then placed into the pocket  R waves were very prominent and measured >0.87mV.  Steri- Strips and a sterile dressing were then applied.  There were no early apparent complications.     CONCLUSIONS:   1. Successful implantation of a implantable loop recorder for a history of recurrent tachycardia and atrial fibrillation  2. No early apparent complications.   Efraim Grange, MD  Cardiac Electrophysiology

## 2023-07-10 ENCOUNTER — Ambulatory Visit: Attending: Cardiovascular Disease | Admitting: Cardiovascular Disease

## 2023-07-10 ENCOUNTER — Encounter: Payer: Self-pay | Admitting: Cardiovascular Disease

## 2023-07-10 ENCOUNTER — Encounter

## 2023-07-10 VITALS — BP 128/74 | HR 76 | Ht 68.0 in | Wt 197.2 lb

## 2023-07-10 DIAGNOSIS — I484 Atypical atrial flutter: Secondary | ICD-10-CM | POA: Diagnosis not present

## 2023-07-10 MED ORDER — APIXABAN 5 MG PO TABS: 5.0000 mg | ORAL_TABLET | Freq: Two times a day (BID) | ORAL | Status: DC

## 2023-07-10 MED ORDER — APIXABAN 5 MG PO TABS: 5.0000 mg | ORAL_TABLET | Freq: Two times a day (BID) | ORAL | 11 refills | Status: DC

## 2023-07-10 NOTE — Patient Instructions (Addendum)
 Medication Instructions:  START Eliquis  5 mg twice daily  STOP [facility] *If you need a refill on your cardiac medications before your next appointment, please call your pharmacy*  Lab Work: CBC and BMET - please have pre-procedure lab work completed on Monday, June 16 . This can be done at ANY [facility] near you - no appointment required and this does not have to be fasting. If you have labs (blood work) drawn today and your tests are completely normal, you will receive your results only by: MyChart Message (if you have MyChart) OR A paper copy in the mail If you have any lab test that is abnormal or we need to change your treatment, we will call you to review the results.  Testing/Procedures: Cardioversion - needs to be scheduled for June 9 or June 10 Your physician has recommended that you have a Cardioversion (DCCV). Electrical Cardioversion uses a jolt of electricity to your heart either through paddles or wired patches attached to your chest. This is a controlled, usually prescheduled, procedure. Defibrillation is done under light anesthesia in the hospital, and you usually go home the day of the procedure. This is done to get your heart back into a normal rhythm. You are not awake for the procedure. Please see the instruction sheet given to you today.   Cardiac CT - someone will contact you to schedule this  Your physician has requested that you have cardiac CT. Cardiac computed tomography (CT) is a painless test that uses an x-ray machine to take clear, detailed pictures of your heart. For further information please visit [email or web address] Please follow instruction sheet as given.   Atrial Flutter Ablation - scheduled on Tuesday, July 15. We will be in contact closer to your ablation date with further instructions Your physician has recommended that you have an ablation. Catheter ablation is a medical procedure used to treat some cardiac arrhythmias (irregular heartbeats). During  catheter ablation, a long, thin, flexible tube is put into a blood vessel in your groin (upper thigh), or neck. This tube is called an ablation catheter. It is then guided to your heart through the blood vessel. Radio frequency waves destroy small areas of heart tissue where abnormal heartbeats may cause an arrhythmia to start. Please see the instruction sheet given to you today.  Follow-Up: At [facility], you and your health needs are our priority.  As part of our continuing mission to provide you with exceptional heart care, our providers are all part of one team.  This team includes your primary Cardiologist (physician) and Advanced Practice Providers or APPs (Physician Assistants and Nurse Practitioners) who all work together to provide you with the care you need, when you need it.  Your next appointment:   We will schedule follow up after your ablation  Provider:   Marlane Silver, MD   Cardiac Ablation Cardiac ablation is a procedure to destroy, or ablate, a small amount of heart tissue that is causing problems. The heart has many electrical connections. Sometimes, these connections are abnormal and can cause the heart to beat very fast or irregularly. Ablating the abnormal areas can improve the heart's rhythm or return it to normal. Ablation may be done for people who: Have irregular or rapid heartbeats (arrhythmias). Have Wolff-Parkinson-White syndrome. Have taken medicines for an arrhythmia that did not work or caused side effects. Have a high-risk heartbeat that may be life-threatening. Tell a health care provider about: Any allergies you have. All medicines you are taking, including  vitamins, herbs, eye drops, creams, and over-the-counter medicines. Any problems you or family members have had with anesthesia. Any bleeding problems you have. Any surgeries you have had. Any medical conditions you have. Whether you are pregnant or may be pregnant. What are the  risks? Your health care provider will talk with you about risks. These may include: Infection. Bruising and bleeding. Stroke or blood clots. Damage to nearby structures or organs. Allergic reaction to medicines or dyes. Needing a pacemaker if the heart gets damaged. A pacemaker is a device that helps the heart beat normally. Failure of the procedure. A repeat procedure may be needed. What happens before the procedure? Medicines Ask your health care provider about: Changing or stopping your regular medicines. These include any heart rhythm medicines, diabetes medicines, or blood thinners you take. Taking medicines such as aspirin  and ibuprofen. These medicines can thin your blood. Do not take them unless your health care provider tells you to. Taking over-the-counter medicines, vitamins, herbs, and supplements. General instructions Follow instructions from your health care provider about what you may eat and drink. If you will be going home right after the procedure, plan to have a responsible adult: Take you home from the hospital or clinic. You will not be allowed to drive. Care for you for the time you are told. Ask your health care provider what steps will be taken to prevent infection. What happens during the procedure?  An IV will be inserted into one of your veins. You may be given: A sedative. This helps you relax. Anesthesia. This will: Numb certain areas of your body. An incision will be made in your neck or your groin. A needle will be inserted through the incision and into a large vein in your neck or groin. The small, thin tube (catheter) will be inserted through the needle and moved to your heart. A type of X-ray (fluoroscopy) will be used to help guide the catheter and provide images of the heart on a monitor. Dye may be injected through the catheter to help your surgeon see the area of the heart that needs treatment. Electrical currents will be sent from the catheter  to destroy heart tissue in certain areas. There are three types of energy that may be used to do this: Heat (radiofrequency energy). Laser energy. Extreme cold (cryoablation). When the tissue has been destroyed, the catheter will be removed. Pressure will be held on the insertion area to prevent bleeding. A bandage (dressing) will be placed over the insertion area. The procedure may vary among health care providers and hospitals. What happens after the procedure? Your blood pressure, heart rate and rhythm, breathing rate, and blood oxygen level will be monitored until you leave the hospital or clinic. Your insertion area will be checked for bleeding. You will need to lie still for a few hours. If your groin was used, you will need to keep your leg straight for a few hours after the catheter is removed. This information is not intended to replace advice given to you by your health care provider. Make sure you discuss any questions you have with your health care provider. Document Revised: 07/31/2021 Document Reviewed: 07/31/2021 Elsevier Patient Education  2024 Elsevier Inc.   Implantable Loop Recorder Placement, Care After This sheet gives you information about how to care for yourself after your procedure. Your health care provider may also give you more specific instructions. If you have problems or questions, contact your health care provider. What can I expect after  the procedure? After the procedure, it is common to have: Soreness or discomfort near the incision. Some swelling or bruising near the incision.  Follow these instructions at home: Incision care  Monitor your cardiac device site for redness, swelling, and drainage. Call the device clinic at [phone] if you experience these symptoms or fever/chills.  Keep the large square bandage on your site for 24 hours and then you may remove it yourself. Keep the steri-strips underneath in place.   You may shower after 72 hours /  3 days from your procedure with the steri-strips in place. They will usually fall off on their own, or may be removed after 10 days. Pat dry.   Avoid lotions, ointments, or perfumes over your incision until it is well-healed.  Please do not submerge in water until your site is completely healed.   Your device is MRI compatible.   Remote monitoring is used to monitor your cardiac device from home. This monitoring is scheduled every month by our office. It allows us  to keep an eye on the function of your device to ensure it is working properly.  If your wound site starts to bleed apply pressure.    For help with the monitor please call Med[employer] Monitor Support Specialist directly at [phone].    If you have any questions/concerns please call the device clinic at [phone].  Activity  Return to your normal activities.  General instructions Follow instructions from your health care provider about how to manage your implantable loop recorder and transmit the information. Learn how to activate a recording if this is necessary for your type of device. You may go through a metal detection gate, and you may let someone hold a metal detector over your chest. Show your ID card if needed. Do not have an MRI unless you check with your health care provider first. Take over-the-counter and prescription medicines only as told by your health care provider. Keep all follow-up visits as told by your health care provider. This is important. Contact a health care provider if: You have redness, swelling, or pain around your incision. You have a fever. You have pain that is not relieved by your pain medicine. You have triggered your device because of fainting (syncope) or because of a heartbeat that feels like it is racing, slow, fluttering, or skipping (palpitations). Get help right away if you have: Chest pain. Difficulty breathing. Summary After the procedure, it is common to have soreness or  discomfort near the incision. Change your dressing as told by your health care provider. Follow instructions from your health care provider about how to manage your implantable loop recorder and transmit the information. Keep all follow-up visits as told by your health care provider. This is important. This information is not intended to replace advice given to you by your health care provider. Make sure you discuss any questions you have with your health care provider. Document Released: 01/23/2015 Document Revised: 03/29/2017 Document Reviewed: 03/29/2017 Elsevier Patient Education  2020 Elsevier Inc. ------------------------------------------------------------------------------    Dear Kyle [name]  You are scheduled for a Cardioversion on Monday, June 9 with Dr. Alda Amas.  Please arrive at the [facility] (Main Entrance A) at [facility]: [address] [place], [place] [address] at 6:30 AM (This time is 1 hour(s) before your procedure to ensure your preparation). Your procedure is scheduled to begin at 7:30 AM.  Free valet parking service is available. You will check in at ADMITTING.   *Please Note:  You will receive a call the day before your procedure to confirm the appointment time. That time may have changed from the original time based on the schedule for that day.*   DIET:  Nothing to eat or drink after midnight except a sip of water with medications (see medication instructions below)  MEDICATION INSTRUCTIONS: !!IF ANY NEW MEDICATIONS ARE STARTED AFTER TODAY, PLEASE NOTIFY YOUR PROVIDER AS SOON AS POSSIBLE!!  FYI: Medications such as Semaglutide (Ozempic, [place]), Tirzepatide (Mounjaro, Zepbound), Dulaglutide (Trulicity), etc ("GLP1 agonists") AND Canagliflozin (Invokana), Dapagliflozin (Farxiga), Empagliflozin (Jardiance), Ertugliflozin (Steglatro), Bexagliflozin [employer]) or any combination with one of these drugs such as Invokamet (Canagliflozin/Metformin), Synjardy  (Empagliflozin/Metformin), etc ("SGLT2 inhibitors") must be held around the time of a procedure. This is not a comprehensive list of all of these drugs. Please review all of your medications and talk to your provider if you take any one of these. If you are not sure, ask your provider.   Continue taking your anticoagulant (blood thinner): Apixaban  (Eliquis ).  You will need to continue this after your procedure until you are told by your provider that it is safe to stop.    LABS:   You will need to complete a BMP and CBC today 07/10/23 after your office visit.   FYI:  For your safety, and to allow us  to monitor your vital signs accurately during the surgery/procedure we request: If you have artificial nails, gel coating, SNS etc, please have those removed prior to your surgery/procedure. Not having the nail coverings /polish removed may result in cancellation or delay of your surgery/procedure.  Your support person will be asked to wait in the waiting room during your procedure.  It is OK to have someone drop you off and come back when you are ready to be discharged.  You cannot drive after the procedure and will need someone to drive you home.  Bring your insurance cards.  *Special Note: Every effort is made to have your procedure done on time. Occasionally there are emergencies that occur at the hospital that may cause delays. Please be patient if a delay does occur.

## 2023-07-11 ENCOUNTER — Ambulatory Visit: Payer: Self-pay | Admitting: Cardiovascular Disease

## 2023-07-11 LAB — BASIC METABOLIC PANEL WITH GFR
BUN/Creatinine Ratio: 19 (ref 10–24)
BUN: 17 mg/dL (ref 8–27)
CO2: 18 mmol/L — ABNORMAL LOW (ref 20–29)
Calcium: 9.5 mg/dL (ref 8.6–10.2)
Chloride: 100 mmol/L (ref 96–106)
Creatinine, Ser: 0.88 mg/dL (ref 0.76–1.27)
Glucose: 93 mg/dL (ref 70–99)
Potassium: 4.5 mmol/L (ref 3.5–5.2)
Sodium: 135 mmol/L (ref 134–144)
eGFR: 89 mL/min/{1.73_m2} (ref >59)

## 2023-07-11 LAB — CBC
Hematocrit: 41 % (ref 37.5–51.0)
Hemoglobin: 13.5 g/dL (ref 13.0–17.7)
MCH: 31.4 pg (ref 26.6–33.0)
MCHC: 32.9 g/dL (ref 31.5–35.7)
MCV: 95 fL (ref 79–97)
Platelets: 317 10*3/uL (ref 150–450)
RBC: 4.3 x10E6/uL (ref 4.14–5.80)
RDW: 12.2 % (ref 11.6–15.4)
WBC: 11 10*3/uL — ABNORMAL HIGH (ref 3.4–10.8)

## 2023-07-24 ENCOUNTER — Telehealth: Payer: Self-pay | Admitting: Cardiovascular Disease

## 2023-07-24 NOTE — Telephone Encounter (Signed)
 Pt requesting a cb to discuss if additional labs for procedure are needed due to a letter that he rec'vd

## 2023-07-24 NOTE — Telephone Encounter (Signed)
 Spoke with patient and he states he was told by Nurse Mansfield Seip that he can get lab work the day he got his loop recorder placed. He states he was told his blood work was ok. He just received letter to get blood work done again. He would like to know if he have to repeat his labs

## 2023-07-25 DIAGNOSIS — M9902 Segmental and somatic dysfunction of thoracic region: Secondary | ICD-10-CM | POA: Diagnosis not present

## 2023-07-25 DIAGNOSIS — M9905 Segmental and somatic dysfunction of pelvic region: Secondary | ICD-10-CM | POA: Diagnosis not present

## 2023-07-25 DIAGNOSIS — M9903 Segmental and somatic dysfunction of lumbar region: Secondary | ICD-10-CM | POA: Diagnosis not present

## 2023-07-25 DIAGNOSIS — M9901 Segmental and somatic dysfunction of cervical region: Secondary | ICD-10-CM | POA: Diagnosis not present

## 2023-07-25 NOTE — Telephone Encounter (Signed)
 Attempted to contact patient, second set of lab work is for his ablation on 7/15 - needs to be completed week of June 16 (CT scheduled for 6/24)

## 2023-08-01 ENCOUNTER — Telehealth: Payer: Self-pay | Admitting: Cardiovascular Disease

## 2023-08-01 NOTE — Progress Notes (Signed)
 Spoke to patient and instructed them to come at 0630  and to be NPO after 0000.  Medications reviewed.    Confirmed that patient will have a ride home and someone to stay with them for 24 hours after the procedure.

## 2023-08-01 NOTE — Telephone Encounter (Signed)
 Pt wants someone to look at his Loop Recorder and see if he is still in afib BEFORE he has cardioversion. Please Advise

## 2023-08-01 NOTE — Telephone Encounter (Signed)
 Called patient and informed him based on the transmission received that he is still in Afib/Aflutter. Patient appreciative of the call back.

## 2023-08-03 NOTE — Anesthesia Preprocedure Evaluation (Signed)
 Anesthesia Evaluation  Patient identified by MRN, date of birth, ID band Patient awake    Reviewed: Allergy & Precautions, NPO status , Patient's Chart, lab work & pertinent test results  History of Anesthesia Complications (+) history of anesthetic complications (reports hypotension with back surgery)  Airway Mallampati: III  TM Distance: >3 FB Neck ROM: Full   Comment: Previous grade II view with Miller 2, easy mask Dental  (+) Dental Advisory Given   Pulmonary neg shortness of breath, sleep apnea and Continuous Positive Airway Pressure Ventilation , neg COPD, neg recent URI   Pulmonary exam normal breath sounds clear to auscultation       Cardiovascular (-) angina + CAD  (-) Past MI, (-) Cardiac Stents and (-) CABG + dysrhythmias Atrial Fibrillation  Rhythm:Irregular Rate:Normal  TTE 05/14/2022: IMPRESSIONS    1. Left ventricular ejection fraction, by estimation, is 60 to 65%. The  left ventricle has normal function. The left ventricle has no regional  wall motion abnormalities. There is mild concentric left ventricular  hypertrophy. Left ventricular diastolic  parameters are indeterminate.   2. Right ventricular systolic function is normal. The right ventricular  size is normal. Tricuspid regurgitation signal is inadequate for assessing  PA pressure.   3. Left atrial size was moderately dilated.   4. The mitral valve is degenerative. No evidence of mitral valve  regurgitation. Moderate mitral annular calcification.   5. Aortic valve regurgitation is trivial. Aortic valve  sclerosis/calcification is present, without any evidence of aortic  stenosis.   6. The inferior vena cava is normal in size with greater than 50%  respiratory variability, suggesting right atrial pressure of 3 mmHg.     Neuro/Psych neg Seizures H/o concussion syndrome  Neuromuscular disease (neuropathy)    GI/Hepatic Neg liver ROS,neg GERD   ,,Diverticulitis    Endo/Other  negative endocrine ROS    Renal/GU negative Renal ROS     Musculoskeletal  (+) Arthritis ,    Abdominal   Peds  Hematology negative hematology ROS (+) Lab Results      Component                Value               Date                      WBC                      11.0 (H)            07/10/2023                HGB                      13.5                07/10/2023                HCT                      41.0                07/10/2023                MCV                      95  07/10/2023                PLT                      317                 07/10/2023              Anesthesia Other Findings Last Eliquis : this morning  Reproductive/Obstetrics                             Anesthesia Physical Anesthesia Plan  ASA: 3  Anesthesia Plan: General   Post-op Pain Management: Minimal or no pain anticipated   Induction: Intravenous  PONV Risk Score and Plan: 2 and Treatment may vary due to age or medical condition  Airway Management Planned: Natural Airway and Nasal Cannula  Additional Equipment:   Intra-op Plan:   Post-operative Plan:   Informed Consent: I have reviewed the patients History and Physical, chart, labs and discussed the procedure including the risks, benefits and alternatives for the proposed anesthesia with the patient or authorized representative who has indicated his/her understanding and acceptance.       Plan Discussed with: CRNA and Anesthesiologist  Anesthesia Plan Comments: (Risks of general anesthesia discussed including, but not limited to, sore throat, hoarse voice, chipped/damaged teeth, injury to vocal cords, nausea and vomiting, allergic reactions, lung infection, heart attack, stroke, and death. All questions answered. )        Anesthesia Quick Evaluation

## 2023-08-04 ENCOUNTER — Ambulatory Visit (HOSPITAL_COMMUNITY)
Admission: RE | Admit: 2023-08-04 | Discharge: 2023-08-04 | Disposition: A | Discharge status: Home / Self Care | Attending: Cardiology | Admitting: Cardiology

## 2023-08-04 ENCOUNTER — Encounter (HOSPITAL_COMMUNITY): Payer: Self-pay | Admitting: Cardiology

## 2023-08-04 ENCOUNTER — Encounter (HOSPITAL_COMMUNITY)
Admission: RE | Disposition: A | Discharge status: Home / Self Care | Payer: Self-pay | Source: Home / Self Care | Attending: Cardiology

## 2023-08-04 ENCOUNTER — Ambulatory Visit (HOSPITAL_COMMUNITY): Payer: Self-pay | Admitting: Anesthesiology

## 2023-08-04 ENCOUNTER — Other Ambulatory Visit: Payer: Self-pay

## 2023-08-04 DIAGNOSIS — D6869 Other thrombophilia: Secondary | ICD-10-CM | POA: Diagnosis not present

## 2023-08-04 DIAGNOSIS — Z7901 Long term (current) use of anticoagulants: Secondary | ICD-10-CM | POA: Diagnosis not present

## 2023-08-04 DIAGNOSIS — I2510 Atherosclerotic heart disease of native coronary artery without angina pectoris: Secondary | ICD-10-CM | POA: Diagnosis not present

## 2023-08-04 DIAGNOSIS — I4891 Unspecified atrial fibrillation: Secondary | ICD-10-CM

## 2023-08-04 DIAGNOSIS — G4733 Obstructive sleep apnea (adult) (pediatric): Secondary | ICD-10-CM

## 2023-08-04 DIAGNOSIS — I484 Atypical atrial flutter: Secondary | ICD-10-CM | POA: Insufficient documentation

## 2023-08-04 DIAGNOSIS — I4719 Other supraventricular tachycardia: Secondary | ICD-10-CM | POA: Diagnosis not present

## 2023-08-04 DIAGNOSIS — G4730 Sleep apnea, unspecified: Secondary | ICD-10-CM | POA: Diagnosis not present

## 2023-08-04 SURGERY — CARDIOVERSION (CATH LAB)
Anesthesia: General

## 2023-08-04 MED ORDER — PROPOFOL 10 MG/ML IV BOLUS
INTRAVENOUS | Status: DC | PRN
Start: 2023-08-04 — End: 2023-08-04
  Administered 2023-08-04 (×2): 20 mg via INTRAVENOUS
  Administered 2023-08-04: 60 mg via INTRAVENOUS

## 2023-08-04 MED ORDER — LIDOCAINE 2% (20 MG/ML) 5 ML SYRINGE
INTRAMUSCULAR | Status: DC | PRN
  Administered 2023-08-04: 60 mg via INTRAVENOUS

## 2023-08-04 MED ORDER — SODIUM CHLORIDE 0.9% FLUSH: 3.0000 mL | INTRAVENOUS | Status: DC | PRN

## 2023-08-04 MED ORDER — SODIUM CHLORIDE 0.9% FLUSH
3.0000 mL | Freq: Two times a day (BID) | INTRAVENOUS | Status: DC
  Administered 2023-08-04 (×3): 5 mL via INTRAVENOUS

## 2023-08-04 SURGICAL SUPPLY — 1 items: PAD DEFIB RADIO PHYSIO CONN (PAD) ×1 IMPLANT

## 2023-08-04 NOTE — Interval H&P Note (Signed)
 History and Physical Interval Note:  08/04/2023 7:38 AM  Kyle [name]  has presented today for surgery, with the diagnosis of ATYPICAL AFLUTTER.  The various methods of treatment have been discussed with the patient and family. After consideration of risks, benefits and other options for treatment, the patient has consented to  Procedure(s): CARDIOVERSION (N/A) as a surgical intervention.  The patient's history has been reviewed, patient examined, no change in status, stable for surgery.  I have reviewed the patient's chart and labs.  Questions were answered to the patient's satisfaction.     Wendie Hamburg

## 2023-08-04 NOTE — Anesthesia Postprocedure Evaluation (Signed)
 Anesthesia Post Note  Patient: Kyle [name]  Procedure(s) Performed: CARDIOVERSION     Patient location during evaluation: PACU Anesthesia Type: General Level of consciousness: awake Pain management: pain level controlled Vital Signs Assessment: post-procedure vital signs reviewed and stable Respiratory status: spontaneous breathing, nonlabored ventilation and respiratory function stable Cardiovascular status: blood pressure returned to baseline and stable Postop Assessment: no apparent nausea or vomiting Anesthetic complications: no   There were no known notable events for this encounter.  Last Vitals:  Vitals:   08/04/23 0815 08/04/23 0830  BP: 135/86 (!) 140/80  Pulse: 71   Resp: 15   Temp:    SpO2: 96% 98%    Last Pain:  Vitals:   08/04/23 0830  TempSrc:   PainSc: 0-No pain                 Conard Decent

## 2023-08-04 NOTE — CV Procedure (Signed)
 Procedure:   DCCV  Indication:  Symptomatic atrial flutter  Procedure Note:  The patient signed informed consent.  They have had had therapeutic anticoagulation with Eliquis  greater than 3 weeks.  Anesthesia was administered by Dr. Leilani Punter and Adelene Homer, CRNA.  Adequate airway was maintained throughout and vital followed per protocol.  They were cardioverted x 1 with 200J of biphasic synchronized energy.  They converted to NSR.  There were no apparent complications.  The patient had normal neuro status and respiratory status post procedure with vitals stable as recorded elsewhere.    Follow up:  They will continue on current medical therapy and follow up with cardiology as scheduled.  Carson Clara, MD 08/04/2023 8:18 AM

## 2023-08-04 NOTE — Transfer of Care (Signed)
 Immediate Anesthesia Transfer of Care Note  Patient: Kyle [name]  Procedure(s) Performed: CARDIOVERSION  Patient Location: CATH LAB [facility]  Anesthesia Type:MAC  Level of Consciousness: awake, drowsy, and patient cooperative  Airway & Oxygen Therapy: Patient Spontanous Breathing and Patient connected to nasal cannula oxygen  Post-op Assessment: Report given to RN and Post -op Vital signs reviewed and stable  Post vital signs: Reviewed and stable  Last Vitals:  Vitals Value Taken Time  BP    Temp    Pulse    Resp    SpO2      Last Pain:  Vitals:   08/04/23 0707  TempSrc: Temporal  PainSc: 0-No pain         Complications: There were no known notable events for this encounter.

## 2023-08-06 DIAGNOSIS — D1801 Hemangioma of skin and subcutaneous tissue: Secondary | ICD-10-CM | POA: Diagnosis not present

## 2023-08-06 DIAGNOSIS — Z85828 Personal history of other malignant neoplasm of skin: Secondary | ICD-10-CM | POA: Diagnosis not present

## 2023-08-06 DIAGNOSIS — L812 Freckles: Secondary | ICD-10-CM | POA: Diagnosis not present

## 2023-08-06 DIAGNOSIS — B078 Other viral warts: Secondary | ICD-10-CM | POA: Diagnosis not present

## 2023-08-06 DIAGNOSIS — L570 Actinic keratosis: Secondary | ICD-10-CM | POA: Diagnosis not present

## 2023-08-06 DIAGNOSIS — L821 Other seborrheic keratosis: Secondary | ICD-10-CM | POA: Diagnosis not present

## 2023-08-10 ENCOUNTER — Ambulatory Visit (INDEPENDENT_AMBULATORY_CARE_PROVIDER_SITE_OTHER)

## 2023-08-10 DIAGNOSIS — I484 Atypical atrial flutter: Secondary | ICD-10-CM | POA: Diagnosis not present

## 2023-08-11 ENCOUNTER — Other Ambulatory Visit: Payer: Self-pay

## 2023-08-11 DIAGNOSIS — I483 Typical atrial flutter: Secondary | ICD-10-CM

## 2023-08-11 DIAGNOSIS — I484 Atypical atrial flutter: Secondary | ICD-10-CM | POA: Diagnosis not present

## 2023-08-11 DIAGNOSIS — Z01812 Encounter for preprocedural laboratory examination: Secondary | ICD-10-CM

## 2023-08-11 LAB — CUP PACEART REMOTE DEVICE CHECK
Date Time Interrogation Session: 20250615104311
Implantable Pulse Generator Implant Date: 20250515

## 2023-08-12 ENCOUNTER — Ambulatory Visit: Payer: Self-pay | Admitting: Cardiovascular Disease

## 2023-08-12 LAB — BASIC METABOLIC PANEL WITH GFR
BUN/Creatinine Ratio: 21 (ref 10–24)
BUN: 19 mg/dL (ref 8–27)
CO2: 20 mmol/L (ref 20–29)
Calcium: 9.1 mg/dL (ref 8.6–10.2)
Chloride: 99 mmol/L (ref 96–106)
Creatinine, Ser: 0.92 mg/dL (ref 0.76–1.27)
Glucose: 93 mg/dL (ref 70–99)
Potassium: 4.6 mmol/L (ref 3.5–5.2)
Sodium: 135 mmol/L (ref 134–144)
eGFR: 86 mL/min/{1.73_m2} (ref >59)

## 2023-08-12 LAB — CBC
Hematocrit: 40.7 % (ref 37.5–51.0)
Hemoglobin: 13.7 g/dL (ref 13.0–17.7)
MCH: 31.9 pg (ref 26.6–33.0)
MCHC: 33.7 g/dL (ref 31.5–35.7)
MCV: 95 fL (ref 79–97)
Platelets: 308 10*3/uL (ref 150–450)
RBC: 4.3 x10E6/uL (ref 4.14–5.80)
RDW: 11.8 % (ref 11.6–15.4)
WBC: 9.7 10*3/uL (ref 3.4–10.8)

## 2023-08-14 DIAGNOSIS — M9903 Segmental and somatic dysfunction of lumbar region: Secondary | ICD-10-CM | POA: Diagnosis not present

## 2023-08-14 DIAGNOSIS — M9902 Segmental and somatic dysfunction of thoracic region: Secondary | ICD-10-CM | POA: Diagnosis not present

## 2023-08-14 DIAGNOSIS — M9905 Segmental and somatic dysfunction of pelvic region: Secondary | ICD-10-CM | POA: Diagnosis not present

## 2023-08-14 DIAGNOSIS — M9901 Segmental and somatic dysfunction of cervical region: Secondary | ICD-10-CM | POA: Diagnosis not present

## 2023-08-19 ENCOUNTER — Ambulatory Visit (HOSPITAL_COMMUNITY)
Admission: RE | Admit: 2023-08-19 | Discharge: 2023-08-19 | Discharge status: Home / Self Care | Source: Ambulatory Visit | Attending: Family Medicine | Admitting: Family Medicine

## 2023-08-19 DIAGNOSIS — I517 Cardiomegaly: Secondary | ICD-10-CM | POA: Diagnosis not present

## 2023-08-19 DIAGNOSIS — I4891 Unspecified atrial fibrillation: Secondary | ICD-10-CM | POA: Insufficient documentation

## 2023-08-19 DIAGNOSIS — I700 Atherosclerosis of aorta: Secondary | ICD-10-CM | POA: Insufficient documentation

## 2023-08-19 DIAGNOSIS — I484 Atypical atrial flutter: Secondary | ICD-10-CM

## 2023-08-19 MED ORDER — IOHEXOL 350 MG/ML SOLN
100.0000 mL | Freq: Once | INTRAVENOUS | Status: AC | PRN
Start: 2023-08-19 — End: 2023-08-19
  Administered 2023-08-19: 100 mL via INTRAVENOUS

## 2023-09-02 ENCOUNTER — Telehealth (HOSPITAL_COMMUNITY): Payer: Self-pay

## 2023-09-02 NOTE — Telephone Encounter (Signed)
 Attempted to reach patient to discuss upcoming procedure, no answer. Left VM for patient to return call.

## 2023-09-03 NOTE — Telephone Encounter (Signed)
 Patient returned call to discuss upcoming procedure.   CT: completed.  Labs: completed.   Any recent signs of acute illness or been started on antibiotics? No Any new medications started? No Any medications to hold? No Any missed doses of blood thinner? No  Advised patient to continue taking ANTICOAGULANT: Eliquis  (Apixaban ) twice daily without missing any doses.  Medication instructions:  On the morning of your procedure DO NOT take any medication., including Eliquis  or the procedure may be rescheduled. Nothing to eat or drink after midnight prior to your procedure.  Confirmed patient is scheduled for Atrial Fibrillation Ablation, Atrial Flutter Ablation on Tuesday, July 15 with Dr. Eulas Furbish. Instructed patient to arrive at the Main Entrance A at [facility]: [address] [place], [place] [address] and check in at Admitting at 8:00 AM.  Advised of plan to go home the same day and will only stay overnight if medically necessary. You MUST have a responsible adult to drive you home and MUST be with you the first 24 hours after you arrive home or your procedure could be cancelled.  Patient verbalized understanding to all instructions provided and agreed to proceed with procedure.

## 2023-09-05 DIAGNOSIS — M9902 Segmental and somatic dysfunction of thoracic region: Secondary | ICD-10-CM | POA: Diagnosis not present

## 2023-09-05 DIAGNOSIS — M9901 Segmental and somatic dysfunction of cervical region: Secondary | ICD-10-CM | POA: Diagnosis not present

## 2023-09-05 DIAGNOSIS — M9905 Segmental and somatic dysfunction of pelvic region: Secondary | ICD-10-CM | POA: Diagnosis not present

## 2023-09-05 DIAGNOSIS — M9903 Segmental and somatic dysfunction of lumbar region: Secondary | ICD-10-CM | POA: Diagnosis not present

## 2023-09-08 NOTE — Pre-Procedure Instructions (Signed)
 Instructed patient on the following items: Arrival time 0800 Nothing to eat or drink after midnight No meds AM of procedure Responsible person to drive you home and stay with you for 24 hrs  Have you missed any doses of anti-coagulant Eliquis- takes twice a day, hasn't missed any doses.  Don't take dose morning of procedure.

## 2023-09-09 ENCOUNTER — Ambulatory Visit (HOSPITAL_COMMUNITY): Admitting: Registered Nurse

## 2023-09-09 ENCOUNTER — Ambulatory Visit (HOSPITAL_COMMUNITY)
Admission: RE | Admit: 2023-09-09 | Discharge: 2023-09-09 | Disposition: A | Discharge status: Home / Self Care | Attending: Cardiovascular Disease | Admitting: Cardiovascular Disease

## 2023-09-09 ENCOUNTER — Ambulatory Visit (HOSPITAL_COMMUNITY)
Admission: RE | Disposition: A | Discharge status: Home / Self Care | Payer: Self-pay | Source: Home / Self Care | Attending: Cardiovascular Disease

## 2023-09-09 ENCOUNTER — Other Ambulatory Visit: Payer: Self-pay

## 2023-09-09 DIAGNOSIS — I4719 Other supraventricular tachycardia: Secondary | ICD-10-CM | POA: Insufficient documentation

## 2023-09-09 DIAGNOSIS — G4730 Sleep apnea, unspecified: Secondary | ICD-10-CM | POA: Insufficient documentation

## 2023-09-09 DIAGNOSIS — Z7901 Long term (current) use of anticoagulants: Secondary | ICD-10-CM | POA: Diagnosis not present

## 2023-09-09 DIAGNOSIS — I4891 Unspecified atrial fibrillation: Secondary | ICD-10-CM | POA: Diagnosis not present

## 2023-09-09 DIAGNOSIS — Z79899 Other long term (current) drug therapy: Secondary | ICD-10-CM | POA: Insufficient documentation

## 2023-09-09 DIAGNOSIS — I4892 Unspecified atrial flutter: Secondary | ICD-10-CM | POA: Diagnosis present

## 2023-09-09 DIAGNOSIS — I480 Paroxysmal atrial fibrillation: Secondary | ICD-10-CM | POA: Diagnosis not present

## 2023-09-09 DIAGNOSIS — I2510 Atherosclerotic heart disease of native coronary artery without angina pectoris: Secondary | ICD-10-CM | POA: Diagnosis not present

## 2023-09-09 DIAGNOSIS — D6869 Other thrombophilia: Secondary | ICD-10-CM | POA: Insufficient documentation

## 2023-09-09 LAB — POCT ACTIVATED CLOTTING TIME: Activated Clotting Time: 274 s

## 2023-09-09 MED ORDER — OXYCODONE HCL 5 MG PO TABS: 5.0000 mg | ORAL_TABLET | Freq: Once | ORAL | Status: DC | PRN

## 2023-09-09 MED ORDER — FENTANYL CITRATE (PF) 100 MCG/2ML IJ SOLN: 25.0000 ug | INTRAMUSCULAR | Status: DC | PRN

## 2023-09-09 MED ORDER — HEPARIN SODIUM (PORCINE) 1000 UNIT/ML IJ SOLN
INTRAMUSCULAR | Status: DC | PRN
  Administered 2023-09-09: 14000 [IU] via INTRAVENOUS
  Administered 2023-09-09: 3000 [IU] via INTRAVENOUS

## 2023-09-09 MED ORDER — PROTAMINE SULFATE 10 MG/ML IV SOLN
INTRAVENOUS | Status: DC | PRN
Start: 2023-09-09 — End: 2023-09-09
  Administered 2023-09-09: 40 mg via INTRAVENOUS
  Administered 2023-09-09: 10 mg via INTRAVENOUS

## 2023-09-09 MED ORDER — SUGAMMADEX SODIUM 200 MG/2ML IV SOLN
INTRAVENOUS | Status: DC | PRN
  Administered 2023-09-09: 339.2 mg via INTRAVENOUS

## 2023-09-09 MED ORDER — PHENYLEPHRINE HCL-NACL 20-0.9 MG/250ML-% IV SOLN
INTRAVENOUS | Status: DC | PRN
  Administered 2023-09-09: 50 ug/min via INTRAVENOUS

## 2023-09-09 MED ORDER — ACETAMINOPHEN 10 MG/ML IV SOLN: 1000.0000 mg | Freq: Once | INTRAVENOUS | Status: DC | PRN

## 2023-09-09 MED ORDER — ONDANSETRON HCL 4 MG/2ML IJ SOLN: 4.0000 mg | Freq: Four times a day (QID) | INTRAMUSCULAR | Status: DC | PRN

## 2023-09-09 MED ORDER — DEXAMETHASONE SODIUM PHOSPHATE 10 MG/ML IJ SOLN
INTRAMUSCULAR | Status: DC | PRN
  Administered 2023-09-09: 5 mg via INTRAVENOUS

## 2023-09-09 MED ORDER — ROCURONIUM BROMIDE 10 MG/ML (PF) SYRINGE
PREFILLED_SYRINGE | INTRAVENOUS | Status: DC | PRN
  Administered 2023-09-09: 10 mg via INTRAVENOUS
  Administered 2023-09-09: 60 mg via INTRAVENOUS

## 2023-09-09 MED ORDER — FENTANYL CITRATE (PF) 100 MCG/2ML IJ SOLN
INTRAMUSCULAR | Status: AC
  Filled 2023-09-09: qty 2

## 2023-09-09 MED ORDER — ONDANSETRON HCL 4 MG/2ML IJ SOLN
INTRAMUSCULAR | Status: DC | PRN
  Administered 2023-09-09: 4 mg via INTRAVENOUS

## 2023-09-09 MED ORDER — OXYCODONE HCL 5 MG/5ML PO SOLN: 5.0000 mg | Freq: Once | ORAL | Status: DC | PRN

## 2023-09-09 MED ORDER — HEPARIN (PORCINE) IN NACL 1000-0.9 UT/500ML-% IV SOLN
INTRAVENOUS | Status: DC | PRN
  Administered 2023-09-09 (×3): 500 mL

## 2023-09-09 MED ORDER — LIDOCAINE 2% (20 MG/ML) 5 ML SYRINGE
INTRAMUSCULAR | Status: DC | PRN
  Administered 2023-09-09: 40 mg via INTRAVENOUS

## 2023-09-09 MED ORDER — ONDANSETRON HCL 4 MG/2ML IJ SOLN: 4.0000 mg | Freq: Once | INTRAMUSCULAR | Status: DC | PRN

## 2023-09-09 MED ORDER — PROPOFOL 10 MG/ML IV BOLUS
INTRAVENOUS | Status: DC | PRN
  Administered 2023-09-09: 150 mg via INTRAVENOUS

## 2023-09-09 MED ORDER — SODIUM CHLORIDE 0.9 % IV SOLN: INTRAVENOUS | Status: DC

## 2023-09-09 MED ORDER — FENTANYL CITRATE (PF) 250 MCG/5ML IJ SOLN
INTRAMUSCULAR | Status: DC | PRN
  Administered 2023-09-09 (×2): 50 ug via INTRAVENOUS

## 2023-09-09 MED ORDER — ATROPINE SULFATE 1 MG/ML IV SOLN
INTRAVENOUS | Status: DC | PRN
  Administered 2023-09-09: 1 mg via INTRAVENOUS

## 2023-09-09 NOTE — Anesthesia Preprocedure Evaluation (Signed)
 Anesthesia Evaluation  Patient identified by MRN, date of birth, ID band Patient awake    Reviewed: Allergy & Precautions, NPO status , Patient's Chart, lab work & pertinent test results, reviewed documented beta blocker date and time   History of Anesthesia Complications Negative for: history of anesthetic complications  Airway Mallampati: II  TM Distance: >3 FB     Dental no notable dental hx.    Pulmonary sleep apnea and Continuous Positive Airway Pressure Ventilation , neg COPD   breath sounds clear to auscultation       Cardiovascular + CAD  + dysrhythmias Atrial Fibrillation  Rhythm:Regular Rate:Normal     Neuro/Psych neg Seizures  Neuromuscular disease    GI/Hepatic ,neg GERD  ,,(+) neg Cirrhosis        Endo/Other    Renal/GU Renal disease     Musculoskeletal  (+) Arthritis ,    Abdominal   Peds  Hematology   Anesthesia Other Findings   Reproductive/Obstetrics                              Anesthesia Physical Anesthesia Plan  ASA: 2  Anesthesia Plan: General   Post-op Pain Management:    Induction: Intravenous  PONV Risk Score and Plan: 2 and Ondansetron  and Dexamethasone   Airway Management Planned: Oral ETT  Additional Equipment:   Intra-op Plan:   Post-operative Plan: Extubation in OR  Informed Consent: I have reviewed the patients History and Physical, chart, labs and discussed the procedure including the risks, benefits and alternatives for the proposed anesthesia with the patient or authorized representative who has indicated his/her understanding and acceptance.     Dental advisory given  Plan Discussed with: CRNA  Anesthesia Plan Comments:         Anesthesia Quick Evaluation

## 2023-09-09 NOTE — H&P (Signed)
 Electrophysiology Office Note:    Date:  09/09/2023   ID:  Kyle [name], DOB 1946-01-30, MRN [id number]  PCP:  Kyle Mirza, MD   [facility] Providers Cardiologist:  Kyle Balding, MD Electrophysiologist:  Kyle FORBES Furbish, MD     Referring MD: No ref. provider found   History of Present Illness:    Kyle [name] is a 78 y.o. male with a hx listed below, significant for atrial tachycardia, referred for arrhythmia management.  Patient originally saw Dr. Alveta in March due to atrial flutter.  He was referred for cardioversion, which was successful.  Echocardiogram showed normal LV function.  He was diagnosed with sleep apnea and started CPAP.  He is referred today by Dr. Wendel when he returned with shortness of breath and chest discomfort.  He was noted to be in recurrent atrial arrhythmia.  He was taken to the EP lab on August 05, 2022 for EP study and mapping of arrhythmia.  Although his EKG at times had the appearance of atrial flutter, we determined that he had an atrial tachycardia arising from the right pulmonary vein.  I isolated the right superior pulmonary vein.  During the ablation, the patient converted to atrial fibrillation, which we had not appreciated previously. And then to Sinus rhythm.   He had recurrence of the arrhythmia about a month following the procedure and underwent DCCV with return to sinus rhythm.  Today (07/10/2023), he notes that he had onset of shortness of breath and fatigue about 2 weeks ago.  EKG today shows rhythm similar to the prior tachycardia we addressed in June.  I reviewed the patient's CT and labs. There was no LAA thrombus. he  has not missed any doses of anticoagulation, and he took his dose last night. There have been no changes in the patient's diagnoses, medications, or condition since our recent clinic visit.     EKGs/Labs/Other Studies Reviewed Today:    Echocardiogram:  TTE 05/14/22 EF 60-65%, moderately dilated  LA   Monitors:   Stress testing:  07/12/2022 --no reversible ischemia.  Normal ejection fraction   Advanced imaging:  CT cardiac scoring September 2024.  89th percentile for age and sex matched control.  Cardiac catherization          Recent Labs: 04/14/2023: ALT 22; TSH 4.15 08/11/2023: BUN 19; Creatinine, Ser 0.92; Hemoglobin 13.7; Platelets 308; Potassium 4.6; Sodium 135     Physical Exam:    VS:  BP 134/74   Pulse 74   Temp 98 F (36.7 C) (Oral)   Resp 18   Ht 5' 8 (1.727 m)   Wt 84.8 kg   SpO2 98%   BMI 28.43 kg/m     Wt Readings from Last 3 Encounters:  09/09/23 84.8 kg  07/10/23 89.4 kg  05/23/23 87.7 kg     GEN: Well nourished, well developed in no acute distress CARDIAC: RRR, no murmurs, rubs, gallops RESPIRATORY:  Normal work of breathing MUSCULOSKELETAL: no edema    ASSESSMENT & PLAN:    Pulmonary vein tachycardia and atrial fibrillation Had the appearance of atrial flutter on EKG, but mapped to a discreet focus in the RSPV The rhythm mapped to the right superior pulmonary vein -- there was patchy scar and heterogenous conduction in this area. This may have been a micro re-entrant mechanism rather than an automatic focal tachycardia. Mildly symptomatic with shortness of breath He is status post ablation of the right superior pulmonary vein Due to to recurrence of  arrhythmia after ablation, inducible atrial fibrillation during the procedure, very mild symptoms I think it best to place a loop recorder for arrhythmia management and monitoring for atrial fibrillation -- we will proceed with this today We also discussed management options for his recurrent arrhythmia.  Using a shared decision making approach, we opted to return to the EP lab for additional mapping.  Anticipate we will need to isolate his pulmonary veins.  We discussed the indication, rationale, logistics, anticipated benefits, and potential risks of the ablation procedure including  but not limited to -- bleed at the groin access site, chest pain, damage to nearby organs such as the diaphragm, lungs, or esophagus, need for a drainage tube, or prolonged hospitalization. I explained that the risk for stroke, heart attack, need for open chest surgery, or even death is very low but not zero. he  expressed understanding and wishes to proceed.   Secondary hypercoagulable state We will plan for cardioversion, which was successful in the past. Continue apixaban  prior to cardioversion and ablation I hope to DC apixaban  after his ablation and monitor for recurrence of atrial fibrillation with his loop recorder.         Medication Adjustments/Labs and Tests Ordered: Current medicines are reviewed at length with the patient today.  Concerns regarding medicines are outlined above.  Orders Placed This Encounter  Procedures   Informed Consent Details: Physician/Practitioner Attestation; Transcribe to consent form and obtain patient signature   Initiate Pre-op  Protocol   Void on call to EP Lab   Confirm CBC and BMP (or CMP) results within 7 days for inpatient and 30 days for outpatient:   Clip right and left femoral area PM before surgery   Clip right internal jugular area PM before surgery   Pre-admission testing diagnosis   EP STUDY   Insert peripheral IV   Meds ordered this encounter  Medications   0.9 %  sodium chloride  infusion     Signed, Kyle FORBES Furbish, MD  09/09/2023 9:09 AM    [facility]

## 2023-09-09 NOTE — Discharge Instructions (Signed)

## 2023-09-09 NOTE — Transfer of Care (Signed)
 Immediate Anesthesia Transfer of Care Note  Patient: Kyle [name]  Procedure(s) Performed: ATRIAL FIBRILLATION ABLATION  Patient Location: PACU and Cath Lab  Anesthesia Type:General  Level of Consciousness: drowsy and responds to stimulation  Airway & Oxygen Therapy: Patient Spontanous Breathing  Post-op Assessment: Report given to RN and Post -op Vital signs reviewed and stable  Post vital signs: Reviewed and stable  Last Vitals:  Vitals Value Taken Time  BP 120/73 09/09/23 11:05  Temp 36.6 C 09/09/23 11:04  Pulse 67 09/09/23 11:10  Resp 18 09/09/23 11:10  SpO2 97 % 09/09/23 11:10  Vitals shown include unfiled device data.  Last Pain:  Vitals:   09/09/23 0850  TempSrc: Oral         Complications: There were no known notable events for this encounter.

## 2023-09-09 NOTE — Anesthesia Procedure Notes (Signed)
 Procedure Name: Intubation Date/Time: 09/09/2023 9:42 AM  Performed by: Virgil Ee, CRNAPre-anesthesia Checklist: Patient identified, Patient being monitored, Timeout performed, Emergency Drugs available and Suction available Patient Re-evaluated:Patient Re-evaluated prior to induction Oxygen Delivery Method: Circle system utilized Preoxygenation: Pre-oxygenation with 100% oxygen Induction Type: IV induction Ventilation: Mask ventilation without difficulty Laryngoscope Size: Mac and 4 Grade View: Grade II Tube type: Oral Tube size: 7.5 mm Number of attempts: 1 Airway Equipment and Method: Stylet Placement Confirmation: ETT inserted through vocal cords under direct vision, positive ETCO2 and breath sounds checked- equal and bilateral Secured at: 23 cm Tube secured with: Tape Dental Injury: Teeth and Oropharynx as per pre-operative assessment

## 2023-09-10 ENCOUNTER — Telehealth (HOSPITAL_COMMUNITY): Payer: Self-pay

## 2023-09-10 ENCOUNTER — Ambulatory Visit

## 2023-09-10 ENCOUNTER — Encounter (HOSPITAL_COMMUNITY): Payer: Self-pay | Admitting: Cardiovascular Disease

## 2023-09-10 DIAGNOSIS — I484 Atypical atrial flutter: Secondary | ICD-10-CM | POA: Diagnosis not present

## 2023-09-10 NOTE — Telephone Encounter (Signed)
 Spoke with patient to complete post procedure follow up call.  Patient reports no complications with groin sites.   Instructions reviewed with patient:  Remove large bandage at puncture site after 24 hours. It is normal to have bruising, tenderness, mild swelling, and a pea or marble sized lump/knot at the groin site which can take up to three months to resolve.  Get help right away if you notice sudden swelling at the puncture site.  Check your puncture site every day for signs of infection: fever, redness, swelling, pus drainage, warmth, foul odor or excessive pain. If this occurs, please call the office at [phone], to speak with the nurse. Get help right away if your puncture site is bleeding and the bleeding does not stop after applying firm pressure to the area.  You may continue to have skipped beats/ atrial fibrillation during the first several months after your procedure.  It is very important not to miss any doses of your blood thinner Eliquis .    You will follow up with the Afib clinic on 10/08/23 and follow up with Dr.Augustus Mealor on 12/08/23.   Patient verbalized understanding to all instructions provided.

## 2023-09-10 NOTE — Anesthesia Postprocedure Evaluation (Signed)
 Anesthesia Post Note  Patient: Kyle [name]  Procedure(s) Performed: ATRIAL FIBRILLATION ABLATION     Patient location during evaluation: PACU Anesthesia Type: General Level of consciousness: awake and alert Pain management: pain level controlled Vital Signs Assessment: post-procedure vital signs reviewed and stable Respiratory status: spontaneous breathing, nonlabored ventilation, respiratory function stable and patient connected to nasal cannula oxygen Cardiovascular status: blood pressure returned to baseline and stable Postop Assessment: no apparent nausea or vomiting Anesthetic complications: no   There were no known notable events for this encounter.  Last Vitals:  Vitals:   09/09/23 1415 09/09/23 1430  BP:    Pulse: 68 82  Resp: (!) 21 19  Temp:    SpO2: 96% 97%    Last Pain:  Vitals:   09/09/23 1210  TempSrc:   PainSc: 0-No pain                 Lynwood MARLA Cornea

## 2023-09-11 LAB — CUP PACEART REMOTE DEVICE CHECK
Date Time Interrogation Session: 20250716104453
Implantable Pulse Generator Implant Date: 20250515

## 2023-09-11 MED FILL — Fentanyl Citrate Preservative Free (PF) Inj 100 MCG/2ML: INTRAMUSCULAR | Qty: 2 | Status: AC

## 2023-09-15 ENCOUNTER — Encounter: Payer: Self-pay | Admitting: Cardiovascular Disease

## 2023-09-15 ENCOUNTER — Ambulatory Visit: Attending: Cardiovascular Disease | Admitting: Cardiovascular Disease

## 2023-09-15 VITALS — BP 122/60 | HR 81 | Ht 68.0 in | Wt 196.0 lb

## 2023-09-15 DIAGNOSIS — R931 Abnormal findings on diagnostic imaging of heart and coronary circulation: Secondary | ICD-10-CM

## 2023-09-15 DIAGNOSIS — I4719 Other supraventricular tachycardia: Secondary | ICD-10-CM | POA: Diagnosis not present

## 2023-09-15 DIAGNOSIS — E7800 Pure hypercholesterolemia, unspecified: Secondary | ICD-10-CM

## 2023-09-15 DIAGNOSIS — I480 Paroxysmal atrial fibrillation: Secondary | ICD-10-CM | POA: Diagnosis not present

## 2023-09-15 DIAGNOSIS — D6869 Other thrombophilia: Secondary | ICD-10-CM

## 2023-09-15 NOTE — Patient Instructions (Signed)
 Medication Instructions:  Your physician recommends that you continue on your current medications as directed. Please refer to the Current Medication list given to you today.  *If you need a refill on your cardiac medications before your next appointment, please call your pharmacy*  Follow-Up: At [facility], you and your health needs are our priority.  As part of our continuing mission to provide you with exceptional heart care, our providers are all part of one team.  This team includes your primary Cardiologist (physician) and Advanced Practice Providers or APPs (Physician Assistants and Nurse Practitioners) who all work together to provide you with the care you need, when you need it.  Your next appointment:   1 year(s)   Provider:   Jerel Balding, MD

## 2023-09-15 NOTE — Progress Notes (Signed)
 Cardiology Office Note   Date:  09/15/2023  ID:  LUISALBERTO BEEGLE, DOB 30-Dec-1945, MRN [id number] PCP: Watt Mirza, MD  [facility] Providers Cardiologist:  Jerel Balding, MD Electrophysiologist:  Eulas FORBES Furbish, MD     History of Present Illness Kyle [name] is a 78 y.o. male with a history of pulmonary vein tachycardia and paroxysmal atrial fibrillation, moderately dilated left atrium without other significant structural cardiac abnormalities elevated coronary calcium score without significant CAD, mild hypercholesterolemia.  Initially diagnosed with atrial flutter during an orthopedic procedure workup, he underwent an initial EP study and ablation in June 2020 for.  He was identified as having atrial tachycardia originating from the right superior pulmonary vein triggering atrial fibrillation.  He had recurrence of arrhythmia and underwent cardioversion 08/04/2023 and then a second EP study with pulsed field ablation of all pulmonary veins and posterior wall of the left atrium on 09/09/2022.  He has not had any complications from the ablation procedure and to date there has been no recurrence of arrhythmia.  He has a LINQ loop recorder in place, implanted 07/10/2023 there has been no arrhythmia detected on the loop recorder since the cardioversion.  He is unaware of palpitations when the arrhythmia occurs but does notice a reduction in his exercise tolerance.  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  Generally does not have any limitations from shortness of breath and he has not had dizziness, syncope or chest pain.  Denies lower extremity edema or claudication.  Studies Reviewed     09/10/2023 loop recorder download 09/09/2023 A-fib ablation procedure 08/19/2023 cardiac CTfor ablation planning 11/12/2022 coronary calcium score 1994 (89th percentile) 07/12/2022 Lexiscan  Myoview  with no evidence of ischemia  05/14/2022 echocardiogram see  above  Personally reviewed the ECG tracing from 09/09/2023 showing normal sinus rhythm high voltage T waves in leads V3-V5, QTc 446  Risk Assessment/Calculations  CHA2DS2-VASc Score = 3   This indicates a 3.2% annual risk of stroke. The patient's score is based upon: CHF History: 0 HTN History: 0 Diabetes History: 0 Stroke History: 0 Vascular Disease History: 1 Age Score: 2 Gender Score: 0        STOP-Bang Score:         Physical Exam VS:  BP 122/60   Pulse 81   Ht 5' 8 (1.727 m)   Wt 196 lb (88.9 kg)   SpO2 96%   BMI 29.80 kg/m        Wt Readings from Last 3 Encounters:  09/15/23 196 lb (88.9 kg)  09/09/23 187 lb (84.8 kg)  07/10/23 197 lb 3.2 oz (89.4 kg)    GEN: Well nourished, well developed in no acute distress.  Appears very lean and fit, younger than stated age.  Healthy Loop recorder site NECK: No JVD; No carotid bruits CARDIAC: RRR, 2/6 aortic ejection murmur with limited radiation, no diastolic murmurs, rubs, gallops RESPIRATORY:  Clear to auscultation without rales, wheezing or rhonchi  ABDOMEN: Soft, non-tender, non-distended EXTREMITIES:  No edema; No deformity   ASSESSMENT AND PLAN AFib: Arrhythmia appears to have been triggered by pulmonary vein atrial tachycardia, but ablation of the tachycardia did not resolve the problem.  He has now undergone comprehensive pulsed field ablation for atrial fibrillation, just a week ago.  Still on anticoagulation with Eliquis  and understands that he should continue this for minimum 3 months following the ablation procedure.  As a general rule he expresses a desire to avoid prescription pharmaceuticals unless  absolutely necessary.  He has a follow-up visit with Dr. Nancey in October. Elevated coronary calcium score: Asymptomatic and without evidence of ischemia on nuclear stress testing.  Does not want to take statins but is taken a variety of nutritional supplements including red yeast rice.  Most recent LDL cholesterol  was 102.  Has an excellent HDL at 65 and normal triglyceride.  Discussed healthy diet.  He is physically very active.       Dispo: Keep scheduled appointment with Dr. Nancey.  Follow-up in 12 months.  Signed, Jerel Balding, MD

## 2023-09-18 ENCOUNTER — Encounter: Payer: Self-pay | Admitting: Emergency Medicine

## 2023-09-20 ENCOUNTER — Ambulatory Visit: Payer: Self-pay | Admitting: Cardiovascular Disease

## 2023-09-24 NOTE — Progress Notes (Signed)
 [facility] Summary Report / Loop Recorder

## 2023-09-24 NOTE — Addendum Note (Signed)
 Addended by: TAWNI DRILLING D on: 09/24/2023 01:57 PM   Modules accepted: Orders

## 2023-09-26 DIAGNOSIS — M5117 Intervertebral disc disorders with radiculopathy, lumbosacral region: Secondary | ICD-10-CM | POA: Diagnosis not present

## 2023-09-26 DIAGNOSIS — M9903 Segmental and somatic dysfunction of lumbar region: Secondary | ICD-10-CM | POA: Diagnosis not present

## 2023-09-26 DIAGNOSIS — M9902 Segmental and somatic dysfunction of thoracic region: Secondary | ICD-10-CM | POA: Diagnosis not present

## 2023-09-26 DIAGNOSIS — M5385 Other specified dorsopathies, thoracolumbar region: Secondary | ICD-10-CM | POA: Diagnosis not present

## 2023-10-08 ENCOUNTER — Ambulatory Visit (HOSPITAL_COMMUNITY): Admitting: Physician Assistant

## 2023-10-11 ENCOUNTER — Ambulatory Visit (INDEPENDENT_AMBULATORY_CARE_PROVIDER_SITE_OTHER)

## 2023-10-11 DIAGNOSIS — I484 Atypical atrial flutter: Secondary | ICD-10-CM | POA: Diagnosis not present

## 2023-10-14 LAB — CUP PACEART REMOTE DEVICE CHECK
Date Time Interrogation Session: 20250816104219
Implantable Pulse Generator Implant Date: 20250515

## 2023-10-16 ENCOUNTER — Ambulatory Visit: Payer: Self-pay | Admitting: Cardiovascular Disease

## 2023-10-17 DIAGNOSIS — M9903 Segmental and somatic dysfunction of lumbar region: Secondary | ICD-10-CM | POA: Diagnosis not present

## 2023-10-17 DIAGNOSIS — M9905 Segmental and somatic dysfunction of pelvic region: Secondary | ICD-10-CM | POA: Diagnosis not present

## 2023-10-17 DIAGNOSIS — M9901 Segmental and somatic dysfunction of cervical region: Secondary | ICD-10-CM | POA: Diagnosis not present

## 2023-10-17 DIAGNOSIS — M9902 Segmental and somatic dysfunction of thoracic region: Secondary | ICD-10-CM | POA: Diagnosis not present

## 2023-11-04 ENCOUNTER — Other Ambulatory Visit: Payer: Self-pay | Admitting: Family Medicine

## 2023-11-11 ENCOUNTER — Ambulatory Visit (INDEPENDENT_AMBULATORY_CARE_PROVIDER_SITE_OTHER)

## 2023-11-11 DIAGNOSIS — I484 Atypical atrial flutter: Secondary | ICD-10-CM | POA: Diagnosis not present

## 2023-11-14 LAB — CUP PACEART REMOTE DEVICE CHECK
Date Time Interrogation Session: 20250916104214
Implantable Pulse Generator Implant Date: 20250515

## 2023-11-15 ENCOUNTER — Ambulatory Visit: Payer: Self-pay | Admitting: Cardiovascular Disease

## 2023-11-17 NOTE — Progress Notes (Signed)
 Remote Loop Recorder Transmission

## 2023-11-19 NOTE — Progress Notes (Signed)
 Remote Loop Recorder Transmission

## 2023-12-02 NOTE — Progress Notes (Signed)
 Remote Loop Recorder Transmission

## 2023-12-08 ENCOUNTER — Encounter: Payer: Self-pay | Admitting: Cardiovascular Disease

## 2023-12-08 ENCOUNTER — Ambulatory Visit: Attending: Cardiovascular Disease | Admitting: Cardiovascular Disease

## 2023-12-08 VITALS — BP 134/72 | HR 66 | Ht 68.0 in | Wt 197.0 lb

## 2023-12-08 DIAGNOSIS — I480 Paroxysmal atrial fibrillation: Secondary | ICD-10-CM | POA: Diagnosis not present

## 2023-12-08 DIAGNOSIS — I484 Atypical atrial flutter: Secondary | ICD-10-CM | POA: Diagnosis not present

## 2023-12-08 DIAGNOSIS — I4719 Other supraventricular tachycardia: Secondary | ICD-10-CM | POA: Diagnosis not present

## 2023-12-08 NOTE — Patient Instructions (Signed)
 Medication Instructions:  Your physician has recommended you make the following change in your medication:   ** Stop Eliquis    *If you need a refill on your cardiac medications before your next appointment, please call your pharmacy*  Lab Work: None ordered.  If you have labs (blood work) drawn today and your tests are completely normal, you will receive your results only by: MyChart Message (if you have MyChart) OR A paper copy in the mail If you have any lab test that is abnormal or we need to change your treatment, we will call you to review the results.  Testing/Procedures: None ordered.   Follow-Up: At [facility], you and your health needs are our priority.  As part of our continuing mission to provide you with exceptional heart care, our providers are all part of one team.  This team includes your primary Cardiologist (physician) and Advanced Practice Providers or APPs (Physician Assistants and Nurse Practitioners) who all work together to provide you with the care you need, when you need it.  Your next appointment:   6 months with [facility]

## 2023-12-08 NOTE — Progress Notes (Signed)
 Electrophysiology Office Note:    Date:  12/08/2023   ID:  Kyle [name], DOB Oct 03, 1945, MRN [id number]  PCP:  Watt Mirza, MD   [facility] Providers Cardiologist:  Jerel Balding, MD Electrophysiologist:  Eulas FORBES Furbish, MD     Referring MD: Watt Mirza, MD   History of Present Illness:    Kyle [name] is a 78 y.o. male with a hx listed below, significant for atrial tachycardia, referred for arrhythmia management.  Patient originally saw Dr. Alveta in March due to atrial flutter.  He was referred for cardioversion, which was successful.  Echocardiogram showed normal LV function.  He was diagnosed with sleep apnea and started CPAP.  He is referred today by Dr. Wendel when he returned with shortness of breath and chest discomfort.  He was noted to be in recurrent atrial arrhythmia.  He was taken to the EP lab on August 05, 2022 for EP study and mapping of arrhythmia.  Although his EKG at times had the appearance of atrial flutter, we determined that he had an atrial tachycardia arising from the right pulmonary vein.  I isolated the right superior pulmonary vein.  During the ablation, the patient converted to atrial fibrillation, which we had not appreciated previously. And then to Sinus rhythm.   He had recurrence of the arrhythmia about a month following the procedure and underwent DCCV with return to sinus rhythm.  He underwent ablation of atrial fibrillation on September 09, 2023.  Since then, his loop recorder has not detected any episodes of atrial fibrillation.  The patient reports that he has been doing very well and has no complaints today    EKGs/Labs/Other Studies Reviewed Today:    Echocardiogram:  TTE 05/14/22 EF 60-65%, moderately dilated LA   Monitors:   Stress testing:  07/12/2022 --no reversible ischemia.  Normal ejection fraction   Advanced imaging:  CT cardiac scoring September 2024.  89th percentile for age and sex matched  control.  Cardiac catherization          Recent Labs: 04/14/2023: ALT 22; TSH 4.15 08/11/2023: BUN 19; Creatinine, Ser 0.92; Hemoglobin 13.7; Platelets 308; Potassium 4.6; Sodium 135     Physical Exam:    VS:  BP 134/72 (BP Location: Right Arm, Patient Position: Sitting, Cuff Size: Large)   Pulse 66   Ht 5' 8 (1.727 m)   Wt 197 lb (89.4 kg)   SpO2 97%   BMI 29.95 kg/m     Wt Readings from Last 3 Encounters:  12/08/23 197 lb (89.4 kg)  09/15/23 196 lb (88.9 kg)  09/09/23 187 lb (84.8 kg)     GEN: Well nourished, well developed in no acute distress CARDIAC: RRR, no murmurs, rubs, gallops RESPIRATORY:  Normal work of breathing MUSCULOSKELETAL: no edema    ASSESSMENT & PLAN:    Pulmonary vein tachycardia and atrial fibrillation Had the appearance of atrial flutter on EKG, but mapped to a discreet focus in the RSPV The rhythm mapped to the right superior pulmonary vein -- there was patchy scar and heterogenous conduction in this area. This may have been a micro re-entrant mechanism rather than an automatic focal tachycardia. S/p AF ablation 09/09/2023 He has not had recurrence of atrial fibrillation since his ablation   Secondary hypercoagulable state CHA2DS2-VASc score is 3 for age, CAD He has not had any atrial fibrillation since the ablation, so I think the risk of anticoagulation outweighs the benefit as long as we are monitoring his rhythm with  the loop recorder We will plan to restart his anticoagulation if A-fib recurs         Medication Adjustments/Labs and Tests Ordered: Current medicines are reviewed at length with the patient today.  Concerns regarding medicines are outlined above.  Orders Placed This Encounter  Procedures   EKG 12-Lead   No orders of the defined types were placed in this encounter.    Signed, Eulas FORBES Furbish, MD  12/08/2023 4:08 PM    [facility]

## 2023-12-12 ENCOUNTER — Encounter

## 2023-12-12 ENCOUNTER — Ambulatory Visit

## 2023-12-12 DIAGNOSIS — I483 Typical atrial flutter: Secondary | ICD-10-CM | POA: Diagnosis not present

## 2023-12-14 LAB — CUP PACEART REMOTE DEVICE CHECK
Date Time Interrogation Session: 20251017104231
Implantable Pulse Generator Implant Date: 20250515

## 2023-12-18 NOTE — Addendum Note (Signed)
 Addended by: VICCI SELLER A on: 12/18/2023 02:20 PM   Modules accepted: Orders

## 2023-12-18 NOTE — Progress Notes (Signed)
 Remote Loop Recorder Transmission

## 2023-12-22 ENCOUNTER — Ambulatory Visit: Payer: Self-pay | Admitting: Cardiovascular Disease

## 2024-01-12 ENCOUNTER — Encounter

## 2024-01-12 ENCOUNTER — Ambulatory Visit

## 2024-01-12 DIAGNOSIS — I480 Paroxysmal atrial fibrillation: Secondary | ICD-10-CM | POA: Diagnosis not present

## 2024-01-12 LAB — CUP PACEART REMOTE DEVICE CHECK
Date Time Interrogation Session: 20251117104205
Implantable Pulse Generator Implant Date: 20250515

## 2024-01-14 NOTE — Progress Notes (Signed)
 Remote Loop Recorder Transmission

## 2024-01-26 ENCOUNTER — Ambulatory Visit: Payer: Self-pay | Admitting: Cardiovascular Disease

## 2024-02-12 ENCOUNTER — Encounter

## 2024-02-12 DIAGNOSIS — I480 Paroxysmal atrial fibrillation: Secondary | ICD-10-CM

## 2024-02-12 LAB — CUP PACEART REMOTE DEVICE CHECK
Date Time Interrogation Session: 20251218104314
Implantable Pulse Generator Implant Date: 20250515

## 2024-02-13 NOTE — Progress Notes (Signed)
 Remote Loop Recorder Transmission

## 2024-02-25 ENCOUNTER — Ambulatory Visit: Payer: Self-pay | Admitting: Cardiovascular Disease

## 2024-03-14 ENCOUNTER — Ambulatory Visit

## 2024-03-14 DIAGNOSIS — I480 Paroxysmal atrial fibrillation: Secondary | ICD-10-CM | POA: Diagnosis not present

## 2024-03-15 LAB — CUP PACEART REMOTE DEVICE CHECK
Date Time Interrogation Session: 20260118104212
Implantable Pulse Generator Implant Date: 20250515

## 2024-03-19 NOTE — Progress Notes (Signed)
 Remote Loop Recorder Transmission

## 2024-03-20 ENCOUNTER — Ambulatory Visit: Payer: Self-pay | Admitting: Cardiovascular Disease

## 2024-04-05 ENCOUNTER — Ambulatory Visit: Payer: No Typology Code available for payment source

## 2024-04-12 ENCOUNTER — Other Ambulatory Visit

## 2024-04-14 ENCOUNTER — Encounter

## 2024-04-19 ENCOUNTER — Ambulatory Visit: Admitting: Family Medicine

## 2024-05-15 ENCOUNTER — Ambulatory Visit

## 2024-06-08 ENCOUNTER — Ambulatory Visit (HOSPITAL_COMMUNITY): Admitting: Physician Assistant

## 2024-06-15 ENCOUNTER — Ambulatory Visit

## 2024-07-16 ENCOUNTER — Ambulatory Visit

## 2024-08-16 ENCOUNTER — Ambulatory Visit

## 2024-09-16 ENCOUNTER — Ambulatory Visit

## 2024-10-17 ENCOUNTER — Ambulatory Visit

## 2024-11-17 ENCOUNTER — Ambulatory Visit

## 2024-12-18 ENCOUNTER — Ambulatory Visit

## 2025-01-18 ENCOUNTER — Ambulatory Visit

## 2025-02-18 ENCOUNTER — Ambulatory Visit

## 2025-03-21 ENCOUNTER — Ambulatory Visit
